# Patient Record
Sex: Female | Born: 1935
Health system: Southern US, Community
[De-identification: ages and names within clinical notes are randomized; demographics above are authoritative.]

## PROBLEM LIST (undated history)

## (undated) DIAGNOSIS — C7951 Secondary malignant neoplasm of bone: Secondary | ICD-10-CM

## (undated) DIAGNOSIS — Z8719 Personal history of other diseases of the digestive system: Secondary | ICD-10-CM

## (undated) DIAGNOSIS — I10 Essential (primary) hypertension: Secondary | ICD-10-CM

## (undated) DIAGNOSIS — C7949 Secondary malignant neoplasm of other parts of nervous system: Secondary | ICD-10-CM

## (undated) DIAGNOSIS — Z9221 Personal history of antineoplastic chemotherapy: Secondary | ICD-10-CM

## (undated) DIAGNOSIS — E119 Type 2 diabetes mellitus without complications: Secondary | ICD-10-CM

## (undated) DIAGNOSIS — C50919 Malignant neoplasm of unspecified site of unspecified female breast: Secondary | ICD-10-CM

## (undated) DIAGNOSIS — K219 Gastro-esophageal reflux disease without esophagitis: Secondary | ICD-10-CM

## (undated) DIAGNOSIS — Z923 Personal history of irradiation: Secondary | ICD-10-CM

## (undated) HISTORY — DX: Secondary malignant neoplasm of bone: C79.51

## (undated) HISTORY — DX: Secondary malignant neoplasm of other parts of nervous system: C79.49

## (undated) HISTORY — DX: Malignant neoplasm of unspecified site of unspecified female breast: C50.919

## (undated) HISTORY — DX: Personal history of irradiation: Z92.3

## (undated) HISTORY — DX: Personal history of antineoplastic chemotherapy: Z92.21

---

## 1993-10-01 HISTORY — PX: EYE SURGERY: SHX253

## 2004-10-01 DIAGNOSIS — C50919 Malignant neoplasm of unspecified site of unspecified female breast: Secondary | ICD-10-CM

## 2004-10-01 HISTORY — PX: MASTECTOMY: SHX3

## 2004-10-01 HISTORY — DX: Malignant neoplasm of unspecified site of unspecified female breast: C50.919

## 2010-12-08 ENCOUNTER — Other Ambulatory Visit (HOSPITAL_COMMUNITY): Payer: Self-pay | Admitting: Hematology and Oncology

## 2010-12-08 DIAGNOSIS — C50919 Malignant neoplasm of unspecified site of unspecified female breast: Secondary | ICD-10-CM

## 2011-03-08 ENCOUNTER — Other Ambulatory Visit (HOSPITAL_COMMUNITY): Payer: Self-pay

## 2011-03-26 ENCOUNTER — Encounter (HOSPITAL_COMMUNITY)
Admission: RE | Admit: 2011-03-26 | Discharge: 2011-03-26 | Disposition: A | Discharge status: Home / Self Care | Payer: Medicare Other | Source: Ambulatory Visit | Attending: Hematology and Oncology | Admitting: Hematology and Oncology

## 2011-03-26 DIAGNOSIS — C50919 Malignant neoplasm of unspecified site of unspecified female breast: Secondary | ICD-10-CM

## 2011-03-26 DIAGNOSIS — K8020 Calculus of gallbladder without cholecystitis without obstruction: Secondary | ICD-10-CM | POA: Insufficient documentation

## 2011-03-26 LAB — GLUCOSE, CAPILLARY: Glucose-Capillary: 144 mg/dL — ABNORMAL HIGH (ref 70–99)

## 2011-03-26 MED ORDER — FLUDEOXYGLUCOSE F - 18 (FDG) INJECTION
17.3000 | Freq: Once | INTRAVENOUS | Status: AC | PRN
  Administered 2011-03-26: 17.3 via INTRAVENOUS

## 2011-10-09 DIAGNOSIS — I10 Essential (primary) hypertension: Secondary | ICD-10-CM | POA: Diagnosis not present

## 2011-10-09 DIAGNOSIS — M810 Age-related osteoporosis without current pathological fracture: Secondary | ICD-10-CM | POA: Diagnosis not present

## 2011-10-09 DIAGNOSIS — K0500 Acute gingivitis, plaque induced: Secondary | ICD-10-CM | POA: Diagnosis not present

## 2011-12-18 DIAGNOSIS — Z79899 Other long term (current) drug therapy: Secondary | ICD-10-CM | POA: Diagnosis not present

## 2011-12-18 DIAGNOSIS — C50919 Malignant neoplasm of unspecified site of unspecified female breast: Secondary | ICD-10-CM | POA: Diagnosis not present

## 2011-12-18 DIAGNOSIS — E119 Type 2 diabetes mellitus without complications: Secondary | ICD-10-CM | POA: Diagnosis not present

## 2011-12-18 DIAGNOSIS — E785 Hyperlipidemia, unspecified: Secondary | ICD-10-CM | POA: Diagnosis not present

## 2011-12-18 DIAGNOSIS — M810 Age-related osteoporosis without current pathological fracture: Secondary | ICD-10-CM | POA: Diagnosis not present

## 2011-12-18 DIAGNOSIS — Z170 Estrogen receptor positive status [ER+]: Secondary | ICD-10-CM | POA: Diagnosis not present

## 2011-12-18 DIAGNOSIS — Z23 Encounter for immunization: Secondary | ICD-10-CM | POA: Diagnosis not present

## 2011-12-18 DIAGNOSIS — I10 Essential (primary) hypertension: Secondary | ICD-10-CM | POA: Diagnosis not present

## 2011-12-20 DIAGNOSIS — C50919 Malignant neoplasm of unspecified site of unspecified female breast: Secondary | ICD-10-CM | POA: Diagnosis not present

## 2011-12-20 DIAGNOSIS — E119 Type 2 diabetes mellitus without complications: Secondary | ICD-10-CM | POA: Diagnosis not present

## 2011-12-20 DIAGNOSIS — Z79899 Other long term (current) drug therapy: Secondary | ICD-10-CM | POA: Diagnosis not present

## 2011-12-20 DIAGNOSIS — E785 Hyperlipidemia, unspecified: Secondary | ICD-10-CM | POA: Diagnosis not present

## 2011-12-20 DIAGNOSIS — I10 Essential (primary) hypertension: Secondary | ICD-10-CM | POA: Diagnosis not present

## 2011-12-20 DIAGNOSIS — M810 Age-related osteoporosis without current pathological fracture: Secondary | ICD-10-CM | POA: Diagnosis not present

## 2011-12-20 DIAGNOSIS — Z23 Encounter for immunization: Secondary | ICD-10-CM | POA: Diagnosis not present

## 2011-12-20 DIAGNOSIS — Z170 Estrogen receptor positive status [ER+]: Secondary | ICD-10-CM | POA: Diagnosis not present

## 2012-01-15 DIAGNOSIS — E782 Mixed hyperlipidemia: Secondary | ICD-10-CM | POA: Diagnosis not present

## 2012-01-15 DIAGNOSIS — I10 Essential (primary) hypertension: Secondary | ICD-10-CM | POA: Diagnosis not present

## 2012-04-21 DIAGNOSIS — Z780 Asymptomatic menopausal state: Secondary | ICD-10-CM | POA: Diagnosis not present

## 2012-04-21 DIAGNOSIS — M899 Disorder of bone, unspecified: Secondary | ICD-10-CM | POA: Diagnosis not present

## 2012-04-21 DIAGNOSIS — Z9010 Acquired absence of unspecified breast and nipple: Secondary | ICD-10-CM | POA: Diagnosis not present

## 2012-04-21 DIAGNOSIS — Z79899 Other long term (current) drug therapy: Secondary | ICD-10-CM | POA: Diagnosis not present

## 2012-04-21 DIAGNOSIS — Z7983 Long term (current) use of bisphosphonates: Secondary | ICD-10-CM | POA: Diagnosis not present

## 2012-04-21 DIAGNOSIS — E119 Type 2 diabetes mellitus without complications: Secondary | ICD-10-CM | POA: Diagnosis not present

## 2012-04-21 DIAGNOSIS — E785 Hyperlipidemia, unspecified: Secondary | ICD-10-CM | POA: Diagnosis not present

## 2012-04-21 DIAGNOSIS — Z853 Personal history of malignant neoplasm of breast: Secondary | ICD-10-CM | POA: Diagnosis not present

## 2012-04-21 DIAGNOSIS — C50919 Malignant neoplasm of unspecified site of unspecified female breast: Secondary | ICD-10-CM | POA: Diagnosis not present

## 2012-04-21 DIAGNOSIS — M129 Arthropathy, unspecified: Secondary | ICD-10-CM | POA: Diagnosis not present

## 2012-04-21 DIAGNOSIS — I10 Essential (primary) hypertension: Secondary | ICD-10-CM | POA: Diagnosis not present

## 2012-04-21 DIAGNOSIS — M810 Age-related osteoporosis without current pathological fracture: Secondary | ICD-10-CM | POA: Diagnosis not present

## 2012-04-22 DIAGNOSIS — I10 Essential (primary) hypertension: Secondary | ICD-10-CM | POA: Diagnosis not present

## 2012-05-02 ENCOUNTER — Encounter: Payer: Medicare Other | Admitting: Hematology and Oncology

## 2012-05-05 ENCOUNTER — Encounter: Payer: Medicare Other | Admitting: Hematology and Oncology

## 2012-05-05 DIAGNOSIS — C773 Secondary and unspecified malignant neoplasm of axilla and upper limb lymph nodes: Secondary | ICD-10-CM

## 2012-05-05 DIAGNOSIS — C50919 Malignant neoplasm of unspecified site of unspecified female breast: Secondary | ICD-10-CM

## 2012-05-05 DIAGNOSIS — Z170 Estrogen receptor positive status [ER+]: Secondary | ICD-10-CM

## 2012-05-20 ENCOUNTER — Encounter: Payer: Medicare Other | Admitting: Hematology and Oncology

## 2012-05-20 DIAGNOSIS — I10 Essential (primary) hypertension: Secondary | ICD-10-CM | POA: Diagnosis not present

## 2012-05-20 DIAGNOSIS — E119 Type 2 diabetes mellitus without complications: Secondary | ICD-10-CM | POA: Diagnosis not present

## 2012-05-20 DIAGNOSIS — M899 Disorder of bone, unspecified: Secondary | ICD-10-CM | POA: Diagnosis not present

## 2012-05-20 DIAGNOSIS — E785 Hyperlipidemia, unspecified: Secondary | ICD-10-CM | POA: Diagnosis not present

## 2012-05-20 DIAGNOSIS — M949 Disorder of cartilage, unspecified: Secondary | ICD-10-CM | POA: Diagnosis not present

## 2012-05-20 DIAGNOSIS — C50919 Malignant neoplasm of unspecified site of unspecified female breast: Secondary | ICD-10-CM

## 2012-05-27 DIAGNOSIS — I10 Essential (primary) hypertension: Secondary | ICD-10-CM | POA: Diagnosis not present

## 2012-05-27 DIAGNOSIS — C50919 Malignant neoplasm of unspecified site of unspecified female breast: Secondary | ICD-10-CM | POA: Diagnosis not present

## 2012-05-27 DIAGNOSIS — M949 Disorder of cartilage, unspecified: Secondary | ICD-10-CM | POA: Diagnosis not present

## 2012-05-27 DIAGNOSIS — C779 Secondary and unspecified malignant neoplasm of lymph node, unspecified: Secondary | ICD-10-CM | POA: Diagnosis not present

## 2012-05-27 DIAGNOSIS — E785 Hyperlipidemia, unspecified: Secondary | ICD-10-CM | POA: Diagnosis not present

## 2012-05-27 DIAGNOSIS — E119 Type 2 diabetes mellitus without complications: Secondary | ICD-10-CM | POA: Diagnosis not present

## 2012-05-30 ENCOUNTER — Encounter: Payer: Medicare Other | Admitting: Hematology and Oncology

## 2012-05-30 DIAGNOSIS — C50919 Malignant neoplasm of unspecified site of unspecified female breast: Secondary | ICD-10-CM | POA: Diagnosis not present

## 2012-06-04 ENCOUNTER — Other Ambulatory Visit: Payer: Self-pay | Admitting: Hematology and Oncology

## 2012-06-04 DIAGNOSIS — C50919 Malignant neoplasm of unspecified site of unspecified female breast: Secondary | ICD-10-CM

## 2012-06-05 DIAGNOSIS — C50919 Malignant neoplasm of unspecified site of unspecified female breast: Secondary | ICD-10-CM | POA: Diagnosis not present

## 2012-06-05 DIAGNOSIS — Z5111 Encounter for antineoplastic chemotherapy: Secondary | ICD-10-CM | POA: Diagnosis not present

## 2012-06-05 DIAGNOSIS — M810 Age-related osteoporosis without current pathological fracture: Secondary | ICD-10-CM

## 2012-06-16 ENCOUNTER — Encounter (HOSPITAL_COMMUNITY)
Admission: RE | Admit: 2012-06-16 | Discharge: 2012-06-16 | Disposition: A | Discharge status: Home / Self Care | Payer: Medicare Other | Source: Ambulatory Visit | Attending: Hematology and Oncology | Admitting: Hematology and Oncology

## 2012-06-16 DIAGNOSIS — C50919 Malignant neoplasm of unspecified site of unspecified female breast: Secondary | ICD-10-CM

## 2012-06-16 DIAGNOSIS — K5730 Diverticulosis of large intestine without perforation or abscess without bleeding: Secondary | ICD-10-CM | POA: Insufficient documentation

## 2012-06-16 DIAGNOSIS — K8020 Calculus of gallbladder without cholecystitis without obstruction: Secondary | ICD-10-CM | POA: Insufficient documentation

## 2012-06-16 MED ORDER — FLUDEOXYGLUCOSE F - 18 (FDG) INJECTION
17.1000 | Freq: Once | INTRAVENOUS | Status: AC | PRN
  Administered 2012-06-16: 17.1 via INTRAVENOUS

## 2012-06-20 ENCOUNTER — Encounter: Payer: Medicare Other | Admitting: Hematology and Oncology

## 2012-06-20 DIAGNOSIS — Z5111 Encounter for antineoplastic chemotherapy: Secondary | ICD-10-CM | POA: Diagnosis not present

## 2012-06-20 DIAGNOSIS — C50919 Malignant neoplasm of unspecified site of unspecified female breast: Secondary | ICD-10-CM | POA: Diagnosis not present

## 2012-07-01 DIAGNOSIS — E119 Type 2 diabetes mellitus without complications: Secondary | ICD-10-CM | POA: Diagnosis not present

## 2012-07-01 DIAGNOSIS — Z853 Personal history of malignant neoplasm of breast: Secondary | ICD-10-CM | POA: Diagnosis not present

## 2012-07-01 DIAGNOSIS — Z9010 Acquired absence of unspecified breast and nipple: Secondary | ICD-10-CM | POA: Diagnosis not present

## 2012-07-01 DIAGNOSIS — Z510 Encounter for antineoplastic radiation therapy: Secondary | ICD-10-CM | POA: Diagnosis not present

## 2012-07-01 DIAGNOSIS — C50919 Malignant neoplasm of unspecified site of unspecified female breast: Secondary | ICD-10-CM | POA: Diagnosis not present

## 2012-07-01 DIAGNOSIS — Z79899 Other long term (current) drug therapy: Secondary | ICD-10-CM | POA: Diagnosis not present

## 2012-07-01 DIAGNOSIS — I10 Essential (primary) hypertension: Secondary | ICD-10-CM | POA: Diagnosis not present

## 2012-07-02 DIAGNOSIS — C50919 Malignant neoplasm of unspecified site of unspecified female breast: Secondary | ICD-10-CM | POA: Diagnosis not present

## 2012-07-03 DIAGNOSIS — C50919 Malignant neoplasm of unspecified site of unspecified female breast: Secondary | ICD-10-CM | POA: Diagnosis not present

## 2012-07-07 DIAGNOSIS — C50919 Malignant neoplasm of unspecified site of unspecified female breast: Secondary | ICD-10-CM | POA: Diagnosis not present

## 2012-07-08 DIAGNOSIS — C50919 Malignant neoplasm of unspecified site of unspecified female breast: Secondary | ICD-10-CM | POA: Diagnosis not present

## 2012-07-15 DIAGNOSIS — C50919 Malignant neoplasm of unspecified site of unspecified female breast: Secondary | ICD-10-CM | POA: Diagnosis not present

## 2012-07-22 DIAGNOSIS — Z5111 Encounter for antineoplastic chemotherapy: Secondary | ICD-10-CM

## 2012-07-22 DIAGNOSIS — Z23 Encounter for immunization: Secondary | ICD-10-CM | POA: Diagnosis not present

## 2012-07-22 DIAGNOSIS — C50919 Malignant neoplasm of unspecified site of unspecified female breast: Secondary | ICD-10-CM | POA: Diagnosis not present

## 2012-07-30 DIAGNOSIS — C50919 Malignant neoplasm of unspecified site of unspecified female breast: Secondary | ICD-10-CM | POA: Diagnosis not present

## 2012-08-01 DIAGNOSIS — C50919 Malignant neoplasm of unspecified site of unspecified female breast: Secondary | ICD-10-CM | POA: Diagnosis not present

## 2012-08-01 DIAGNOSIS — Z510 Encounter for antineoplastic radiation therapy: Secondary | ICD-10-CM | POA: Diagnosis not present

## 2012-08-04 DIAGNOSIS — Z510 Encounter for antineoplastic radiation therapy: Secondary | ICD-10-CM | POA: Diagnosis not present

## 2012-08-04 DIAGNOSIS — I10 Essential (primary) hypertension: Secondary | ICD-10-CM | POA: Diagnosis not present

## 2012-08-04 DIAGNOSIS — Z13220 Encounter for screening for lipoid disorders: Secondary | ICD-10-CM | POA: Diagnosis not present

## 2012-08-04 DIAGNOSIS — C50919 Malignant neoplasm of unspecified site of unspecified female breast: Secondary | ICD-10-CM | POA: Diagnosis not present

## 2012-08-05 DIAGNOSIS — C50919 Malignant neoplasm of unspecified site of unspecified female breast: Secondary | ICD-10-CM | POA: Diagnosis not present

## 2012-08-05 DIAGNOSIS — Z510 Encounter for antineoplastic radiation therapy: Secondary | ICD-10-CM | POA: Diagnosis not present

## 2012-08-14 DIAGNOSIS — Z510 Encounter for antineoplastic radiation therapy: Secondary | ICD-10-CM | POA: Diagnosis not present

## 2012-08-14 DIAGNOSIS — C50919 Malignant neoplasm of unspecified site of unspecified female breast: Secondary | ICD-10-CM | POA: Diagnosis not present

## 2012-08-18 ENCOUNTER — Encounter: Payer: Medicare Other | Admitting: Internal Medicine

## 2012-08-18 DIAGNOSIS — Z5111 Encounter for antineoplastic chemotherapy: Secondary | ICD-10-CM | POA: Diagnosis not present

## 2012-08-18 DIAGNOSIS — C50919 Malignant neoplasm of unspecified site of unspecified female breast: Secondary | ICD-10-CM | POA: Diagnosis not present

## 2012-08-18 DIAGNOSIS — M810 Age-related osteoporosis without current pathological fracture: Secondary | ICD-10-CM

## 2012-09-17 DIAGNOSIS — C50919 Malignant neoplasm of unspecified site of unspecified female breast: Secondary | ICD-10-CM | POA: Diagnosis not present

## 2012-09-17 DIAGNOSIS — Z5111 Encounter for antineoplastic chemotherapy: Secondary | ICD-10-CM | POA: Diagnosis not present

## 2012-09-17 DIAGNOSIS — M810 Age-related osteoporosis without current pathological fracture: Secondary | ICD-10-CM | POA: Diagnosis not present

## 2012-09-17 DIAGNOSIS — E119 Type 2 diabetes mellitus without complications: Secondary | ICD-10-CM | POA: Diagnosis not present

## 2012-10-27 DIAGNOSIS — C50919 Malignant neoplasm of unspecified site of unspecified female breast: Secondary | ICD-10-CM | POA: Diagnosis not present

## 2012-10-29 DIAGNOSIS — C50919 Malignant neoplasm of unspecified site of unspecified female breast: Secondary | ICD-10-CM | POA: Diagnosis not present

## 2012-10-29 DIAGNOSIS — M810 Age-related osteoporosis without current pathological fracture: Secondary | ICD-10-CM

## 2012-10-29 DIAGNOSIS — M545 Low back pain, unspecified: Secondary | ICD-10-CM | POA: Diagnosis not present

## 2012-10-29 DIAGNOSIS — Z5111 Encounter for antineoplastic chemotherapy: Secondary | ICD-10-CM | POA: Diagnosis not present

## 2012-11-03 DIAGNOSIS — M51379 Other intervertebral disc degeneration, lumbosacral region without mention of lumbar back pain or lower extremity pain: Secondary | ICD-10-CM | POA: Diagnosis not present

## 2012-11-03 DIAGNOSIS — C7951 Secondary malignant neoplasm of bone: Secondary | ICD-10-CM | POA: Diagnosis not present

## 2012-11-03 DIAGNOSIS — Z510 Encounter for antineoplastic radiation therapy: Secondary | ICD-10-CM | POA: Diagnosis not present

## 2012-11-03 DIAGNOSIS — M5137 Other intervertebral disc degeneration, lumbosacral region: Secondary | ICD-10-CM | POA: Diagnosis not present

## 2012-11-03 DIAGNOSIS — Z79899 Other long term (current) drug therapy: Secondary | ICD-10-CM | POA: Diagnosis not present

## 2012-11-03 DIAGNOSIS — Z853 Personal history of malignant neoplasm of breast: Secondary | ICD-10-CM | POA: Diagnosis not present

## 2012-11-03 DIAGNOSIS — C50919 Malignant neoplasm of unspecified site of unspecified female breast: Secondary | ICD-10-CM | POA: Diagnosis not present

## 2012-11-07 ENCOUNTER — Other Ambulatory Visit: Payer: Self-pay | Admitting: Hematology and Oncology

## 2012-11-07 DIAGNOSIS — C7952 Secondary malignant neoplasm of bone marrow: Secondary | ICD-10-CM

## 2012-11-07 DIAGNOSIS — C7951 Secondary malignant neoplasm of bone: Secondary | ICD-10-CM | POA: Diagnosis not present

## 2012-11-07 DIAGNOSIS — I10 Essential (primary) hypertension: Secondary | ICD-10-CM

## 2012-11-07 DIAGNOSIS — E119 Type 2 diabetes mellitus without complications: Secondary | ICD-10-CM | POA: Diagnosis not present

## 2012-11-07 DIAGNOSIS — C50919 Malignant neoplasm of unspecified site of unspecified female breast: Secondary | ICD-10-CM

## 2012-11-11 DIAGNOSIS — C7951 Secondary malignant neoplasm of bone: Secondary | ICD-10-CM | POA: Diagnosis not present

## 2012-11-12 DIAGNOSIS — C7952 Secondary malignant neoplasm of bone marrow: Secondary | ICD-10-CM | POA: Diagnosis not present

## 2012-11-17 DIAGNOSIS — C7951 Secondary malignant neoplasm of bone: Secondary | ICD-10-CM | POA: Diagnosis not present

## 2012-11-17 DIAGNOSIS — C7952 Secondary malignant neoplasm of bone marrow: Secondary | ICD-10-CM | POA: Diagnosis not present

## 2012-11-19 ENCOUNTER — Encounter (HOSPITAL_COMMUNITY)
Admission: RE | Admit: 2012-11-19 | Discharge: 2012-11-19 | Disposition: A | Discharge status: Home / Self Care | Payer: Medicare Other | Source: Ambulatory Visit | Attending: Hematology and Oncology | Admitting: Hematology and Oncology

## 2012-11-19 DIAGNOSIS — K8020 Calculus of gallbladder without cholecystitis without obstruction: Secondary | ICD-10-CM | POA: Diagnosis not present

## 2012-11-19 DIAGNOSIS — C50919 Malignant neoplasm of unspecified site of unspecified female breast: Secondary | ICD-10-CM

## 2012-11-19 DIAGNOSIS — C770 Secondary and unspecified malignant neoplasm of lymph nodes of head, face and neck: Secondary | ICD-10-CM | POA: Diagnosis not present

## 2012-11-19 LAB — GLUCOSE, CAPILLARY: Glucose-Capillary: 121 mg/dL — ABNORMAL HIGH (ref 70–99)

## 2012-11-19 MED ORDER — FLUDEOXYGLUCOSE F - 18 (FDG) INJECTION
16.6000 | Freq: Once | INTRAVENOUS | Status: AC | PRN
  Administered 2012-11-19: 16.6 via INTRAVENOUS

## 2012-11-21 DIAGNOSIS — C7952 Secondary malignant neoplasm of bone marrow: Secondary | ICD-10-CM | POA: Diagnosis not present

## 2012-11-21 DIAGNOSIS — I10 Essential (primary) hypertension: Secondary | ICD-10-CM | POA: Diagnosis not present

## 2012-11-21 DIAGNOSIS — C7951 Secondary malignant neoplasm of bone: Secondary | ICD-10-CM

## 2012-11-21 DIAGNOSIS — C50919 Malignant neoplasm of unspecified site of unspecified female breast: Secondary | ICD-10-CM | POA: Diagnosis not present

## 2012-11-24 DIAGNOSIS — C7952 Secondary malignant neoplasm of bone marrow: Secondary | ICD-10-CM | POA: Diagnosis not present

## 2012-11-27 DIAGNOSIS — C7952 Secondary malignant neoplasm of bone marrow: Secondary | ICD-10-CM

## 2012-11-27 DIAGNOSIS — Z5111 Encounter for antineoplastic chemotherapy: Secondary | ICD-10-CM | POA: Diagnosis not present

## 2012-11-27 DIAGNOSIS — C50919 Malignant neoplasm of unspecified site of unspecified female breast: Secondary | ICD-10-CM | POA: Diagnosis not present

## 2012-11-27 DIAGNOSIS — C7951 Secondary malignant neoplasm of bone: Secondary | ICD-10-CM | POA: Diagnosis not present

## 2012-12-19 DIAGNOSIS — C7952 Secondary malignant neoplasm of bone marrow: Secondary | ICD-10-CM | POA: Diagnosis not present

## 2012-12-19 DIAGNOSIS — C7951 Secondary malignant neoplasm of bone: Secondary | ICD-10-CM

## 2012-12-19 DIAGNOSIS — C50919 Malignant neoplasm of unspecified site of unspecified female breast: Secondary | ICD-10-CM

## 2012-12-26 DIAGNOSIS — Z5111 Encounter for antineoplastic chemotherapy: Secondary | ICD-10-CM | POA: Diagnosis not present

## 2012-12-26 DIAGNOSIS — C50919 Malignant neoplasm of unspecified site of unspecified female breast: Secondary | ICD-10-CM | POA: Diagnosis not present

## 2012-12-26 DIAGNOSIS — C7951 Secondary malignant neoplasm of bone: Secondary | ICD-10-CM

## 2012-12-26 DIAGNOSIS — C7952 Secondary malignant neoplasm of bone marrow: Secondary | ICD-10-CM

## 2012-12-29 DIAGNOSIS — R5383 Other fatigue: Secondary | ICD-10-CM | POA: Diagnosis not present

## 2012-12-29 DIAGNOSIS — E871 Hypo-osmolality and hyponatremia: Secondary | ICD-10-CM | POA: Diagnosis not present

## 2012-12-29 DIAGNOSIS — C50919 Malignant neoplasm of unspecified site of unspecified female breast: Secondary | ICD-10-CM | POA: Diagnosis not present

## 2012-12-29 DIAGNOSIS — M6281 Muscle weakness (generalized): Secondary | ICD-10-CM | POA: Diagnosis not present

## 2012-12-29 DIAGNOSIS — K922 Gastrointestinal hemorrhage, unspecified: Secondary | ICD-10-CM | POA: Diagnosis not present

## 2012-12-29 DIAGNOSIS — G893 Neoplasm related pain (acute) (chronic): Secondary | ICD-10-CM | POA: Diagnosis present

## 2012-12-29 DIAGNOSIS — C7951 Secondary malignant neoplasm of bone: Secondary | ICD-10-CM | POA: Diagnosis not present

## 2012-12-29 DIAGNOSIS — Z780 Asymptomatic menopausal state: Secondary | ICD-10-CM | POA: Diagnosis not present

## 2012-12-29 DIAGNOSIS — Z853 Personal history of malignant neoplasm of breast: Secondary | ICD-10-CM | POA: Diagnosis not present

## 2012-12-29 DIAGNOSIS — R279 Unspecified lack of coordination: Secondary | ICD-10-CM | POA: Diagnosis not present

## 2012-12-29 DIAGNOSIS — M810 Age-related osteoporosis without current pathological fracture: Secondary | ICD-10-CM | POA: Diagnosis present

## 2012-12-29 DIAGNOSIS — E785 Hyperlipidemia, unspecified: Secondary | ICD-10-CM | POA: Diagnosis present

## 2012-12-29 DIAGNOSIS — I10 Essential (primary) hypertension: Secondary | ICD-10-CM | POA: Diagnosis present

## 2012-12-29 DIAGNOSIS — E119 Type 2 diabetes mellitus without complications: Secondary | ICD-10-CM | POA: Diagnosis present

## 2012-12-29 DIAGNOSIS — N390 Urinary tract infection, site not specified: Secondary | ICD-10-CM | POA: Diagnosis not present

## 2012-12-29 DIAGNOSIS — R269 Unspecified abnormalities of gait and mobility: Secondary | ICD-10-CM | POA: Diagnosis not present

## 2012-12-29 DIAGNOSIS — K219 Gastro-esophageal reflux disease without esophagitis: Secondary | ICD-10-CM | POA: Diagnosis not present

## 2012-12-29 DIAGNOSIS — R5381 Other malaise: Secondary | ICD-10-CM | POA: Diagnosis not present

## 2012-12-29 DIAGNOSIS — Z5189 Encounter for other specified aftercare: Secondary | ICD-10-CM | POA: Diagnosis not present

## 2012-12-29 DIAGNOSIS — C7952 Secondary malignant neoplasm of bone marrow: Secondary | ICD-10-CM | POA: Diagnosis not present

## 2012-12-29 DIAGNOSIS — D649 Anemia, unspecified: Secondary | ICD-10-CM | POA: Diagnosis not present

## 2012-12-29 DIAGNOSIS — Z79899 Other long term (current) drug therapy: Secondary | ICD-10-CM | POA: Diagnosis not present

## 2012-12-29 DIAGNOSIS — E236 Other disorders of pituitary gland: Secondary | ICD-10-CM | POA: Diagnosis not present

## 2012-12-30 DIAGNOSIS — C50919 Malignant neoplasm of unspecified site of unspecified female breast: Secondary | ICD-10-CM

## 2013-01-01 DIAGNOSIS — N390 Urinary tract infection, site not specified: Secondary | ICD-10-CM | POA: Diagnosis not present

## 2013-01-01 DIAGNOSIS — G893 Neoplasm related pain (acute) (chronic): Secondary | ICD-10-CM | POA: Diagnosis not present

## 2013-01-01 DIAGNOSIS — E236 Other disorders of pituitary gland: Secondary | ICD-10-CM | POA: Diagnosis not present

## 2013-01-01 DIAGNOSIS — M6281 Muscle weakness (generalized): Secondary | ICD-10-CM | POA: Diagnosis not present

## 2013-01-01 DIAGNOSIS — M546 Pain in thoracic spine: Secondary | ICD-10-CM | POA: Diagnosis not present

## 2013-01-01 DIAGNOSIS — C7981 Secondary malignant neoplasm of breast: Secondary | ICD-10-CM | POA: Diagnosis not present

## 2013-01-01 DIAGNOSIS — C50919 Malignant neoplasm of unspecified site of unspecified female breast: Secondary | ICD-10-CM | POA: Diagnosis not present

## 2013-01-01 DIAGNOSIS — E119 Type 2 diabetes mellitus without complications: Secondary | ICD-10-CM | POA: Diagnosis not present

## 2013-01-01 DIAGNOSIS — M8448XA Pathological fracture, other site, initial encounter for fracture: Secondary | ICD-10-CM | POA: Diagnosis not present

## 2013-01-01 DIAGNOSIS — Z981 Arthrodesis status: Secondary | ICD-10-CM | POA: Diagnosis not present

## 2013-01-01 DIAGNOSIS — R5381 Other malaise: Secondary | ICD-10-CM | POA: Diagnosis not present

## 2013-01-01 DIAGNOSIS — Z79899 Other long term (current) drug therapy: Secondary | ICD-10-CM | POA: Diagnosis not present

## 2013-01-01 DIAGNOSIS — Z5189 Encounter for other specified aftercare: Secondary | ICD-10-CM | POA: Diagnosis not present

## 2013-01-01 DIAGNOSIS — Z794 Long term (current) use of insulin: Secondary | ICD-10-CM | POA: Diagnosis not present

## 2013-01-01 DIAGNOSIS — C7949 Secondary malignant neoplasm of other parts of nervous system: Secondary | ICD-10-CM | POA: Diagnosis not present

## 2013-01-01 DIAGNOSIS — M5380 Other specified dorsopathies, site unspecified: Secondary | ICD-10-CM | POA: Diagnosis not present

## 2013-01-01 DIAGNOSIS — K219 Gastro-esophageal reflux disease without esophagitis: Secondary | ICD-10-CM | POA: Diagnosis not present

## 2013-01-01 DIAGNOSIS — R279 Unspecified lack of coordination: Secondary | ICD-10-CM | POA: Diagnosis not present

## 2013-01-01 DIAGNOSIS — E785 Hyperlipidemia, unspecified: Secondary | ICD-10-CM | POA: Diagnosis not present

## 2013-01-01 DIAGNOSIS — M549 Dorsalgia, unspecified: Secondary | ICD-10-CM | POA: Diagnosis not present

## 2013-01-01 DIAGNOSIS — E871 Hypo-osmolality and hyponatremia: Secondary | ICD-10-CM | POA: Diagnosis not present

## 2013-01-01 DIAGNOSIS — K921 Melena: Secondary | ICD-10-CM | POA: Diagnosis not present

## 2013-01-01 DIAGNOSIS — G992 Myelopathy in diseases classified elsewhere: Secondary | ICD-10-CM | POA: Diagnosis not present

## 2013-01-01 DIAGNOSIS — Z780 Asymptomatic menopausal state: Secondary | ICD-10-CM | POA: Diagnosis not present

## 2013-01-01 DIAGNOSIS — I10 Essential (primary) hypertension: Secondary | ICD-10-CM | POA: Diagnosis not present

## 2013-01-01 DIAGNOSIS — Z452 Encounter for adjustment and management of vascular access device: Secondary | ICD-10-CM | POA: Diagnosis not present

## 2013-01-01 DIAGNOSIS — Z5111 Encounter for antineoplastic chemotherapy: Secondary | ICD-10-CM | POA: Diagnosis not present

## 2013-01-01 DIAGNOSIS — R293 Abnormal posture: Secondary | ICD-10-CM | POA: Diagnosis not present

## 2013-01-01 DIAGNOSIS — D367 Benign neoplasm of other specified sites: Secondary | ICD-10-CM | POA: Diagnosis not present

## 2013-01-01 DIAGNOSIS — C7951 Secondary malignant neoplasm of bone: Secondary | ICD-10-CM | POA: Diagnosis not present

## 2013-01-01 DIAGNOSIS — K922 Gastrointestinal hemorrhage, unspecified: Secondary | ICD-10-CM | POA: Diagnosis not present

## 2013-01-01 DIAGNOSIS — D649 Anemia, unspecified: Secondary | ICD-10-CM | POA: Diagnosis not present

## 2013-01-01 DIAGNOSIS — J9819 Other pulmonary collapse: Secondary | ICD-10-CM | POA: Diagnosis not present

## 2013-01-01 DIAGNOSIS — R269 Unspecified abnormalities of gait and mobility: Secondary | ICD-10-CM | POA: Diagnosis not present

## 2013-01-01 DIAGNOSIS — C801 Malignant (primary) neoplasm, unspecified: Secondary | ICD-10-CM | POA: Diagnosis not present

## 2013-01-07 DIAGNOSIS — C7981 Secondary malignant neoplasm of breast: Secondary | ICD-10-CM | POA: Diagnosis not present

## 2013-01-07 DIAGNOSIS — C801 Malignant (primary) neoplasm, unspecified: Secondary | ICD-10-CM | POA: Diagnosis not present

## 2013-01-08 DIAGNOSIS — C50919 Malignant neoplasm of unspecified site of unspecified female breast: Secondary | ICD-10-CM

## 2013-01-08 DIAGNOSIS — E119 Type 2 diabetes mellitus without complications: Secondary | ICD-10-CM

## 2013-01-08 DIAGNOSIS — K921 Melena: Secondary | ICD-10-CM | POA: Diagnosis not present

## 2013-01-14 DIAGNOSIS — C50919 Malignant neoplasm of unspecified site of unspecified female breast: Secondary | ICD-10-CM | POA: Diagnosis not present

## 2013-01-15 DIAGNOSIS — E119 Type 2 diabetes mellitus without complications: Secondary | ICD-10-CM | POA: Diagnosis not present

## 2013-01-15 DIAGNOSIS — I10 Essential (primary) hypertension: Secondary | ICD-10-CM | POA: Diagnosis not present

## 2013-01-15 DIAGNOSIS — J9819 Other pulmonary collapse: Secondary | ICD-10-CM | POA: Diagnosis not present

## 2013-01-15 DIAGNOSIS — C7951 Secondary malignant neoplasm of bone: Secondary | ICD-10-CM | POA: Diagnosis not present

## 2013-01-15 DIAGNOSIS — C7981 Secondary malignant neoplasm of breast: Secondary | ICD-10-CM | POA: Diagnosis not present

## 2013-01-15 DIAGNOSIS — C50919 Malignant neoplasm of unspecified site of unspecified female breast: Secondary | ICD-10-CM | POA: Diagnosis not present

## 2013-01-15 DIAGNOSIS — Z79899 Other long term (current) drug therapy: Secondary | ICD-10-CM | POA: Diagnosis not present

## 2013-01-15 DIAGNOSIS — Z452 Encounter for adjustment and management of vascular access device: Secondary | ICD-10-CM | POA: Diagnosis not present

## 2013-01-19 DIAGNOSIS — C7951 Secondary malignant neoplasm of bone: Secondary | ICD-10-CM

## 2013-01-19 DIAGNOSIS — Z5111 Encounter for antineoplastic chemotherapy: Secondary | ICD-10-CM

## 2013-01-19 DIAGNOSIS — C7952 Secondary malignant neoplasm of bone marrow: Secondary | ICD-10-CM

## 2013-01-19 DIAGNOSIS — C50919 Malignant neoplasm of unspecified site of unspecified female breast: Secondary | ICD-10-CM

## 2013-01-26 ENCOUNTER — Encounter: Payer: Medicare Other | Admitting: Internal Medicine

## 2013-01-26 DIAGNOSIS — C50919 Malignant neoplasm of unspecified site of unspecified female breast: Secondary | ICD-10-CM | POA: Diagnosis not present

## 2013-01-26 DIAGNOSIS — C7951 Secondary malignant neoplasm of bone: Secondary | ICD-10-CM

## 2013-01-26 DIAGNOSIS — C7952 Secondary malignant neoplasm of bone marrow: Secondary | ICD-10-CM

## 2013-01-26 DIAGNOSIS — Z5111 Encounter for antineoplastic chemotherapy: Secondary | ICD-10-CM

## 2013-02-02 DIAGNOSIS — C7952 Secondary malignant neoplasm of bone marrow: Secondary | ICD-10-CM

## 2013-02-02 DIAGNOSIS — Z5111 Encounter for antineoplastic chemotherapy: Secondary | ICD-10-CM

## 2013-02-02 DIAGNOSIS — C7951 Secondary malignant neoplasm of bone: Secondary | ICD-10-CM

## 2013-02-02 DIAGNOSIS — C50919 Malignant neoplasm of unspecified site of unspecified female breast: Secondary | ICD-10-CM

## 2013-02-03 DIAGNOSIS — M549 Dorsalgia, unspecified: Secondary | ICD-10-CM | POA: Diagnosis not present

## 2013-02-16 ENCOUNTER — Encounter: Payer: Medicare Other | Admitting: Internal Medicine

## 2013-02-16 DIAGNOSIS — G893 Neoplasm related pain (acute) (chronic): Secondary | ICD-10-CM

## 2013-02-16 DIAGNOSIS — C50919 Malignant neoplasm of unspecified site of unspecified female breast: Secondary | ICD-10-CM

## 2013-02-16 DIAGNOSIS — C7952 Secondary malignant neoplasm of bone marrow: Secondary | ICD-10-CM | POA: Diagnosis not present

## 2013-02-16 DIAGNOSIS — Z5111 Encounter for antineoplastic chemotherapy: Secondary | ICD-10-CM | POA: Diagnosis not present

## 2013-02-16 DIAGNOSIS — C7951 Secondary malignant neoplasm of bone: Secondary | ICD-10-CM

## 2013-02-24 DIAGNOSIS — C7952 Secondary malignant neoplasm of bone marrow: Secondary | ICD-10-CM | POA: Diagnosis not present

## 2013-02-24 DIAGNOSIS — Z5111 Encounter for antineoplastic chemotherapy: Secondary | ICD-10-CM

## 2013-02-24 DIAGNOSIS — C50919 Malignant neoplasm of unspecified site of unspecified female breast: Secondary | ICD-10-CM

## 2013-02-24 DIAGNOSIS — C7951 Secondary malignant neoplasm of bone: Secondary | ICD-10-CM

## 2013-03-02 DIAGNOSIS — C801 Malignant (primary) neoplasm, unspecified: Secondary | ICD-10-CM | POA: Diagnosis not present

## 2013-03-02 DIAGNOSIS — C50919 Malignant neoplasm of unspecified site of unspecified female breast: Secondary | ICD-10-CM | POA: Diagnosis not present

## 2013-03-02 DIAGNOSIS — M8448XA Pathological fracture, other site, initial encounter for fracture: Secondary | ICD-10-CM | POA: Diagnosis not present

## 2013-03-03 DIAGNOSIS — C7952 Secondary malignant neoplasm of bone marrow: Secondary | ICD-10-CM

## 2013-03-03 DIAGNOSIS — C50919 Malignant neoplasm of unspecified site of unspecified female breast: Secondary | ICD-10-CM | POA: Diagnosis not present

## 2013-03-03 DIAGNOSIS — C7951 Secondary malignant neoplasm of bone: Secondary | ICD-10-CM | POA: Diagnosis not present

## 2013-03-04 DIAGNOSIS — C7952 Secondary malignant neoplasm of bone marrow: Secondary | ICD-10-CM | POA: Diagnosis not present

## 2013-03-04 DIAGNOSIS — M546 Pain in thoracic spine: Secondary | ICD-10-CM | POA: Diagnosis not present

## 2013-03-06 ENCOUNTER — Encounter (HOSPITAL_COMMUNITY): Admission: AD | Disposition: A | Discharge status: Skilled Nursing Facility | Payer: Self-pay | Attending: Neurosurgery

## 2013-03-06 ENCOUNTER — Inpatient Hospital Stay (HOSPITAL_COMMUNITY): Payer: Medicare Other | Admitting: Anesthesiology

## 2013-03-06 ENCOUNTER — Encounter (HOSPITAL_COMMUNITY): Payer: Self-pay | Admitting: Anesthesiology

## 2013-03-06 ENCOUNTER — Other Ambulatory Visit: Payer: Self-pay | Admitting: Neurosurgery

## 2013-03-06 ENCOUNTER — Inpatient Hospital Stay (HOSPITAL_COMMUNITY): Payer: Medicare Other

## 2013-03-06 ENCOUNTER — Encounter (HOSPITAL_COMMUNITY): Payer: Self-pay | Admitting: *Deleted

## 2013-03-06 ENCOUNTER — Inpatient Hospital Stay (HOSPITAL_COMMUNITY)
Admission: AD | Admit: 2013-03-06 | Discharge: 2013-03-11 | DRG: 029 | Disposition: A | Discharge status: Skilled Nursing Facility | Payer: Medicare Other | Source: Other Acute Inpatient Hospital | Attending: Neurosurgery | Admitting: Neurosurgery

## 2013-03-06 DIAGNOSIS — C7949 Secondary malignant neoplasm of other parts of nervous system: Principal | ICD-10-CM | POA: Diagnosis present

## 2013-03-06 DIAGNOSIS — Z981 Arthrodesis status: Secondary | ICD-10-CM | POA: Diagnosis not present

## 2013-03-06 DIAGNOSIS — Z5189 Encounter for other specified aftercare: Secondary | ICD-10-CM | POA: Diagnosis not present

## 2013-03-06 DIAGNOSIS — I10 Essential (primary) hypertension: Secondary | ICD-10-CM | POA: Diagnosis not present

## 2013-03-06 DIAGNOSIS — R5381 Other malaise: Secondary | ICD-10-CM | POA: Diagnosis not present

## 2013-03-06 DIAGNOSIS — C7951 Secondary malignant neoplasm of bone: Secondary | ICD-10-CM | POA: Diagnosis present

## 2013-03-06 DIAGNOSIS — R279 Unspecified lack of coordination: Secondary | ICD-10-CM | POA: Diagnosis not present

## 2013-03-06 DIAGNOSIS — K219 Gastro-esophageal reflux disease without esophagitis: Secondary | ICD-10-CM | POA: Diagnosis not present

## 2013-03-06 DIAGNOSIS — G992 Myelopathy in diseases classified elsewhere: Secondary | ICD-10-CM | POA: Diagnosis present

## 2013-03-06 DIAGNOSIS — E119 Type 2 diabetes mellitus without complications: Secondary | ICD-10-CM | POA: Diagnosis not present

## 2013-03-06 DIAGNOSIS — E871 Hypo-osmolality and hyponatremia: Secondary | ICD-10-CM | POA: Diagnosis not present

## 2013-03-06 DIAGNOSIS — Z452 Encounter for adjustment and management of vascular access device: Secondary | ICD-10-CM | POA: Diagnosis not present

## 2013-03-06 DIAGNOSIS — D367 Benign neoplasm of other specified sites: Secondary | ICD-10-CM | POA: Diagnosis not present

## 2013-03-06 DIAGNOSIS — M6281 Muscle weakness (generalized): Secondary | ICD-10-CM | POA: Diagnosis not present

## 2013-03-06 DIAGNOSIS — C50919 Malignant neoplasm of unspecified site of unspecified female breast: Secondary | ICD-10-CM | POA: Diagnosis present

## 2013-03-06 DIAGNOSIS — M5380 Other specified dorsopathies, site unspecified: Secondary | ICD-10-CM | POA: Diagnosis not present

## 2013-03-06 DIAGNOSIS — C7931 Secondary malignant neoplasm of brain: Secondary | ICD-10-CM | POA: Diagnosis not present

## 2013-03-06 DIAGNOSIS — K922 Gastrointestinal hemorrhage, unspecified: Secondary | ICD-10-CM | POA: Diagnosis not present

## 2013-03-06 DIAGNOSIS — R293 Abnormal posture: Secondary | ICD-10-CM | POA: Diagnosis not present

## 2013-03-06 DIAGNOSIS — G893 Neoplasm related pain (acute) (chronic): Secondary | ICD-10-CM | POA: Diagnosis not present

## 2013-03-06 DIAGNOSIS — Z794 Long term (current) use of insulin: Secondary | ICD-10-CM | POA: Diagnosis not present

## 2013-03-06 DIAGNOSIS — J9819 Other pulmonary collapse: Secondary | ICD-10-CM | POA: Diagnosis not present

## 2013-03-06 DIAGNOSIS — E785 Hyperlipidemia, unspecified: Secondary | ICD-10-CM | POA: Diagnosis not present

## 2013-03-06 DIAGNOSIS — R269 Unspecified abnormalities of gait and mobility: Secondary | ICD-10-CM | POA: Diagnosis not present

## 2013-03-06 DIAGNOSIS — M8448XA Pathological fracture, other site, initial encounter for fracture: Secondary | ICD-10-CM | POA: Diagnosis not present

## 2013-03-06 DIAGNOSIS — Z780 Asymptomatic menopausal state: Secondary | ICD-10-CM | POA: Diagnosis not present

## 2013-03-06 HISTORY — DX: Essential (primary) hypertension: I10

## 2013-03-06 HISTORY — DX: Gastro-esophageal reflux disease without esophagitis: K21.9

## 2013-03-06 HISTORY — DX: Type 2 diabetes mellitus without complications: E11.9

## 2013-03-06 HISTORY — PX: POSTERIOR LUMBAR FUSION 4 LEVEL: SHX6037

## 2013-03-06 HISTORY — DX: Personal history of other diseases of the digestive system: Z87.19

## 2013-03-06 LAB — PREPARE RBC (CROSSMATCH)

## 2013-03-06 LAB — ABO/RH: ABO/RH(D): O POS

## 2013-03-06 SURGERY — POSTERIOR LUMBAR FUSION 4 LEVEL
Anesthesia: General | Site: Back | Wound class: Clean

## 2013-03-06 MED ORDER — THROMBIN 20000 UNITS EX KIT
PACK | CUTANEOUS | Status: DC | PRN
  Administered 2013-03-06: 21:00:00 via TOPICAL

## 2013-03-06 MED ORDER — ROCURONIUM BROMIDE 100 MG/10ML IV SOLN
INTRAVENOUS | Status: DC | PRN
  Administered 2013-03-06: 10 mg via INTRAVENOUS
  Administered 2013-03-06: 50 mg via INTRAVENOUS
  Administered 2013-03-06 (×2): 10 mg via INTRAVENOUS
  Administered 2013-03-06: 20 mg via INTRAVENOUS

## 2013-03-06 MED ORDER — CEFAZOLIN SODIUM-DEXTROSE 2-3 GM-% IV SOLR
2.0000 g | INTRAVENOUS | Status: AC
  Administered 2013-03-06: 2 g via INTRAVENOUS
  Filled 2013-03-06: qty 50

## 2013-03-06 MED ORDER — BUPIVACAINE LIPOSOME 1.3 % IJ SUSP
20.0000 mL | Freq: Once | INTRAMUSCULAR | Status: DC
  Filled 2013-03-06: qty 20

## 2013-03-06 MED ORDER — LIDOCAINE-EPINEPHRINE 0.5 %-1:200000 IJ SOLN
INTRAMUSCULAR | Status: DC | PRN
  Administered 2013-03-06: 20 mL

## 2013-03-06 MED ORDER — LACTATED RINGERS IV SOLN
INTRAVENOUS | Status: DC | PRN
  Administered 2013-03-06: 20:00:00 via INTRAVENOUS

## 2013-03-06 MED ORDER — WHITE PETROLATUM GEL
Status: AC
  Filled 2013-03-06: qty 5

## 2013-03-06 MED ORDER — 0.9 % SODIUM CHLORIDE (POUR BTL) OPTIME
TOPICAL | Status: DC | PRN
  Administered 2013-03-06: 1000 mL

## 2013-03-06 MED ORDER — DEXAMETHASONE SODIUM PHOSPHATE 4 MG/ML IJ SOLN
INTRAMUSCULAR | Status: DC | PRN
  Administered 2013-03-06: 10 mg via INTRAVENOUS

## 2013-03-06 MED ORDER — LACTATED RINGERS IV SOLN
INTRAVENOUS | Status: DC | PRN
  Administered 2013-03-06 (×2): via INTRAVENOUS

## 2013-03-06 MED ORDER — FENTANYL CITRATE 0.05 MG/ML IJ SOLN
INTRAMUSCULAR | Status: DC | PRN
  Administered 2013-03-06: 100 ug via INTRAVENOUS
  Administered 2013-03-06 (×4): 50 ug via INTRAVENOUS
  Administered 2013-03-06: 100 ug via INTRAVENOUS

## 2013-03-06 MED ORDER — ALBUMIN HUMAN 5 % IV SOLN
INTRAVENOUS | Status: DC | PRN
  Administered 2013-03-06: 22:00:00 via INTRAVENOUS

## 2013-03-06 MED ORDER — SODIUM CHLORIDE 0.9 % IV SOLN
INTRAVENOUS | Status: DC | PRN
  Administered 2013-03-06: 22:00:00 via INTRAVENOUS

## 2013-03-06 SURGICAL SUPPLY — 65 items
BAG DECANTER FOR FLEXI CONT (MISCELLANEOUS) ×2 IMPLANT
BENZOIN TINCTURE PRP APPL 2/3 (GAUZE/BANDAGES/DRESSINGS) IMPLANT
BLADE SURG ROTATE 9660 (MISCELLANEOUS) IMPLANT
BONE VOID FILLER STRIP 10CC (Bone Implant) ×4 IMPLANT
BUR MATCHSTICK NEURO 3.0 LAGG (BURR) ×2 IMPLANT
CANISTER SUCTION 2500CC (MISCELLANEOUS) ×2 IMPLANT
CLOTH BEACON ORANGE TIMEOUT ST (SAFETY) ×2 IMPLANT
CONT SPEC 4OZ CLIKSEAL STRL BL (MISCELLANEOUS) ×2 IMPLANT
COVER BACK TABLE 24X17X13 BIG (DRAPES) IMPLANT
DECANTER SPIKE VIAL GLASS SM (MISCELLANEOUS) ×2 IMPLANT
DERMABOND ADVANCED (GAUZE/BANDAGES/DRESSINGS) ×1
DERMABOND ADVANCED .7 DNX12 (GAUZE/BANDAGES/DRESSINGS) ×1 IMPLANT
DRAPE C-ARM 42X72 X-RAY (DRAPES) ×4 IMPLANT
DRAPE C-ARMOR (DRAPES) ×2 IMPLANT
DRAPE LAPAROTOMY 100X72X124 (DRAPES) ×2 IMPLANT
DRAPE POUCH INSTRU U-SHP 10X18 (DRAPES) ×2 IMPLANT
DRAPE SURG 17X23 STRL (DRAPES) ×2 IMPLANT
DRESSING TELFA 8X3 (GAUZE/BANDAGES/DRESSINGS) ×4 IMPLANT
DURAPREP 26ML APPLICATOR (WOUND CARE) ×2 IMPLANT
ELECT REM PT RETURN 9FT ADLT (ELECTROSURGICAL) ×2
ELECTRODE REM PT RTRN 9FT ADLT (ELECTROSURGICAL) ×1 IMPLANT
GAUZE SPONGE 4X4 16PLY XRAY LF (GAUZE/BANDAGES/DRESSINGS) ×2 IMPLANT
GLOVE BIO SURGEON STRL SZ 6.5 (GLOVE) ×6 IMPLANT
GLOVE BIO SURGEON STRL SZ7 (GLOVE) ×2 IMPLANT
GLOVE ECLIPSE 6.5 STRL STRAW (GLOVE) ×4 IMPLANT
GLOVE ECLIPSE 8.5 STRL (GLOVE) ×2 IMPLANT
GLOVE EXAM NITRILE LRG STRL (GLOVE) IMPLANT
GLOVE EXAM NITRILE MD LF STRL (GLOVE) IMPLANT
GLOVE EXAM NITRILE XL STR (GLOVE) IMPLANT
GLOVE EXAM NITRILE XS STR PU (GLOVE) IMPLANT
GLOVE INDICATOR 6.5 STRL GRN (GLOVE) ×2 IMPLANT
GLOVE INDICATOR 7.5 STRL GRN (GLOVE) ×2 IMPLANT
GLOVE INDICATOR 8.5 STRL (GLOVE) ×2 IMPLANT
GOWN BRE IMP SLV AUR LG STRL (GOWN DISPOSABLE) ×6 IMPLANT
GOWN BRE IMP SLV AUR XL STRL (GOWN DISPOSABLE) IMPLANT
GOWN STRL REIN 2XL LVL4 (GOWN DISPOSABLE) IMPLANT
KIT BASIN OR (CUSTOM PROCEDURE TRAY) ×2 IMPLANT
KIT POSITION SURG JACKSON T1 (MISCELLANEOUS) ×2 IMPLANT
KIT ROOM TURNOVER OR (KITS) ×2 IMPLANT
NEEDLE HYPO 25X1 1.5 SAFETY (NEEDLE) ×2 IMPLANT
NEEDLE SPNL 18GX3.5 QUINCKE PK (NEEDLE) ×2 IMPLANT
NS IRRIG 1000ML POUR BTL (IV SOLUTION) ×2 IMPLANT
PACK LAMINECTOMY NEURO (CUSTOM PROCEDURE TRAY) ×2 IMPLANT
PAD ARMBOARD 7.5X6 YLW CONV (MISCELLANEOUS) ×6 IMPLANT
ROD TI 5.5MMX20CM (Rod) ×4 IMPLANT
SCREW 35MM PEDICLE (Screw) ×4 IMPLANT
SCREW 50MM (Screw) ×6 IMPLANT
SCREW BONE SPINE 30MM (Screw) ×4 IMPLANT
SCREW SET SPINAL STD HEXALOBE (Screw) ×14 IMPLANT
SPONGE GAUZE 4X4 12PLY (GAUZE/BANDAGES/DRESSINGS) ×2 IMPLANT
SPONGE LAP 4X18 X RAY DECT (DISPOSABLE) IMPLANT
SPONGE SURGIFOAM ABS GEL 100 (HEMOSTASIS) ×2 IMPLANT
STAPLER SKIN PROX WIDE 3.9 (STAPLE) ×2 IMPLANT
STRIP CLOSURE SKIN 1/2X4 (GAUZE/BANDAGES/DRESSINGS) IMPLANT
SUT PROLENE 6 0 BV (SUTURE) IMPLANT
SUT VIC AB 0 CT1 18XCR BRD8 (SUTURE) ×2 IMPLANT
SUT VIC AB 0 CT1 8-18 (SUTURE) ×2
SUT VIC AB 2-0 CP2 18 (SUTURE) ×2 IMPLANT
SUT VIC AB 2-0 CT1 18 (SUTURE) ×6 IMPLANT
SUT VIC AB 3-0 SH 8-18 (SUTURE) ×4 IMPLANT
SYR 20ML ECCENTRIC (SYRINGE) ×2 IMPLANT
TAPE CLOTH SURG 4X10 WHT LF (GAUZE/BANDAGES/DRESSINGS) ×2 IMPLANT
TOWEL OR 17X24 6PK STRL BLUE (TOWEL DISPOSABLE) ×2 IMPLANT
TOWEL OR 17X26 10 PK STRL BLUE (TOWEL DISPOSABLE) ×2 IMPLANT
WATER STERILE IRR 1000ML POUR (IV SOLUTION) ×2 IMPLANT

## 2013-03-06 NOTE — Anesthesia Procedure Notes (Signed)
Procedure Name: Intubation Date/Time: 03/06/2013 8:28 PM Performed by: Trixie Deis A Pre-anesthesia Checklist: Patient identified, Timeout performed, Emergency Drugs available, Suction available and Patient being monitored Patient Re-evaluated:Patient Re-evaluated prior to inductionOxygen Delivery Method: Circle system utilized Preoxygenation: Pre-oxygenation with 100% oxygen Intubation Type: IV induction and Rapid sequence Laryngoscope Size: Mac and 3 Grade View: Grade I Tube type: Oral Tube size: 7.0 mm Number of attempts: 1 Airway Equipment and Method: Stylet and LTA kit utilized Placement Confirmation: ETT inserted through vocal cords under direct vision,  breath sounds checked- equal and bilateral and positive ETCO2 Secured at: 21 cm Tube secured with: Tape Dental Injury: Teeth and Oropharynx as per pre-operative assessment

## 2013-03-06 NOTE — Preoperative (Signed)
Beta Blockers   Reason not to administer Beta Blockers:Not Applicable 

## 2013-03-06 NOTE — H&P (Signed)
BP 157/73  Pulse 104  Resp 19  Ht 4\' 8"  (1.422 m)  Wt 58.5 kg (128 lb 15.5 oz)  BMI 28.93 kg/m2  SpO2 98%  Rita Thomas is a 79 y.o. female With metastatic disease to T11-L1 from known breast CA. The lesion at T11/12 has already received 3000cgy to the vertebral bodies, though this has not halted the progression of the disease. She also has been receiving chemotherapy without any real improvement. Recently she has complained of increasing pain in the lower back and an MRI from 03/02/13 showed tumor progression, with cord compression. [name] was seen in our office by Dr. Karie Chimera. He was reviewing the films and felt she could wait for definitive surgery on the 17th of this month.   She was noted to have increasing weakness in the lower extremities by her daughter. She called our office today and felt that the surgery would need to be done quicker than that. I have arranged for her transfer to [facility] for a thoracic laminectomy and pedicle screw fixation. No Known Allergies Prior to Admission medications   Medication Sig Start Date End Date Taking? Authorizing Provider  acetaminophen (TYLENOL) 325 MG tablet Take 650 mg by mouth every 6 (six) hours as needed for pain.   Yes Historical Provider, MD  Calcium Carbonate-Vitamin D (CALTRATE 600+D PO) Take 1 tablet by mouth 2 (two) times daily.   Yes Historical Provider, MD  cholecalciferol (VITAMIN D) 1000 UNITS tablet Take 1,000 Units by mouth daily.   Yes Historical Provider, MD  cyclobenzaprine (FLEXERIL) 5 MG tablet Take 5 mg by mouth every 8 (eight) hours as needed for muscle spasms.   Yes Historical Provider, MD  fentaNYL (DURAGESIC - DOSED MCG/HR) 100 MCG/HR Place 1 patch onto the skin every 3 (three) days. Uses along with a 75 mcg patch to equal 175 mcg every 72 hours.   Yes Historical Provider, MD  fentaNYL (DURAGESIC - DOSED MCG/HR) 75 MCG/HR Place 1 patch onto the skin every 3 (three) days. Uses along with a 100 mcg patch to  equal 175 mcg every 72 hours.   Yes Historical Provider, MD  insulin aspart (NOVOLOG) 100 UNIT/ML injection Inject 0-12 Units into the skin 3 (three) times daily with meals. Sliding scale   Yes Historical Provider, MD  lidocaine (LIDODERM) 5 % Place 1 patch onto the skin daily. Remove & Discard patch within 12 hours or as directed by MD   Yes Historical Provider, MD  lisinopril (PRINIVIL,ZESTRIL) 20 MG tablet Take 20 mg by mouth daily.   Yes Historical Provider, MD  morphine (MS CONTIN) 15 MG 12 hr tablet Take 15 mg by mouth every 12 (twelve) hours.   Yes Historical Provider, MD  Multiple Vitamin (MULTIVITAMIN WITH MINERALS) TABS Take 1 tablet by mouth daily.   Yes Historical Provider, MD  oxyCODONE (ROXICODONE) 15 MG immediate release tablet Take 15 mg by mouth every 4 (four) hours as needed for pain.   Yes Historical Provider, MD  pantoprazole (PROTONIX) 40 MG tablet Take 40 mg by mouth daily.   Yes Historical Provider, MD  vitamin C (ASCORBIC ACID) 500 MG tablet Take 500 mg by mouth daily.   Yes Historical Provider, MD  Zinc 50 MG CAPS Take 1 capsule by mouth daily.   Yes Historical Provider, MD   No family history on file. History   Social History  . Marital Status: Legally Separated    Spouse Name: N/A    Number of Children: N/A  .  Years of Education: N/A   Occupational History  . Not on file.   Social History Main Topics  . Smoking status: Not on file  . Smokeless tobacco: Not on file  . Alcohol Use: No  . Drug Use: No  . Sexually Active: No   Other Topics Concern  . Not on file   Social History Narrative  . No narrative on file    Past Medical Hx: Breast CA, diabetes, HTN Past Surgical Hx: Mastectomy, Cataract removal Phys Exam: Alert and oriented x 4, speech is clear and fluent Perrl, full eom Symmetric facial sensation, symmetric facial movements Hearing intact to voice Uvula elevates in the midline, normal shoulder shrug, tongue protrudes in the midline 5/5  strength in the upper extremities Weakness R>L lower extremities, at least 4/5 in all muscle groups Normal muscle tone and bulk Intact proprioception upper and lower extremities Lungs clear Heart regular rhythm and rate Radiology: Mri shows cord compression and collapse at T11,12. Minor involvement at L1, no structural issues at L1.  Kyphotic at L11/12 disc space, 11/12 both enhance with contrast with epidural tumor anterior to the cord.  A/P Rita Thomas is a 79 y.o. female with metastatic breast ca to the thoracic lumbar junction. I do believe most of her pain is mechanical in nature. She may still be a candidate for radiosurgical treatment at that level despite previous irradiation, thus the decompression via a laminectomy might still be helpful. Nevertheless the decompression will allow for the cord to have more space. Risks and benefits including infection, paralysis, weakness, fusion failure, hardware failure, need for further surgery. She understands and wishes to proceed.

## 2013-03-06 NOTE — Anesthesia Preprocedure Evaluation (Addendum)
Anesthesia Evaluation  Patient identified by MRN, date of birth, ID band Patient awake    Reviewed: Allergy & Precautions, H&P , NPO status , Patient's Chart, lab work & pertinent test results, reviewed documented beta blocker date and time   Airway Mallampati: II TM Distance: >3 FB Neck ROM: full    Dental   Pulmonary neg pulmonary ROS,  breath sounds clear to auscultation        Cardiovascular hypertension, On Medications Rhythm:regular     Neuro/Psych  Neuromuscular disease negative psych ROS   GI/Hepatic Neg liver ROS, hiatal hernia, GERD-  Medicated and Controlled,  Endo/Other  diabetes, Insulin Dependent  Renal/GU negative Renal ROS  negative genitourinary   Musculoskeletal   Abdominal   Peds  Hematology negative hematology ROS (+)   Anesthesia Other Findings See surgeon's H&P   Reproductive/Obstetrics negative OB ROS                           Anesthesia Physical Anesthesia Plan  ASA: III and emergent  Anesthesia Plan: General   Post-op Pain Management:    Induction: Intravenous  Airway Management Planned: Oral ETT  Additional Equipment:   Intra-op Plan:   Post-operative Plan: Extubation in OR  Informed Consent: I have reviewed the patients History and Physical, chart, labs and discussed the procedure including the risks, benefits and alternatives for the proposed anesthesia with the patient or authorized representative who has indicated his/her understanding and acceptance.   Dental Advisory Given  Plan Discussed with: CRNA and Surgeon  Anesthesia Plan Comments:        Anesthesia Quick Evaluation

## 2013-03-07 ENCOUNTER — Inpatient Hospital Stay (HOSPITAL_COMMUNITY): Payer: Medicare Other

## 2013-03-07 DIAGNOSIS — C7951 Secondary malignant neoplasm of bone: Secondary | ICD-10-CM | POA: Diagnosis not present

## 2013-03-07 DIAGNOSIS — C7949 Secondary malignant neoplasm of other parts of nervous system: Secondary | ICD-10-CM | POA: Diagnosis not present

## 2013-03-07 DIAGNOSIS — C50919 Malignant neoplasm of unspecified site of unspecified female breast: Secondary | ICD-10-CM | POA: Diagnosis not present

## 2013-03-07 DIAGNOSIS — G992 Myelopathy in diseases classified elsewhere: Secondary | ICD-10-CM | POA: Diagnosis not present

## 2013-03-07 LAB — TYPE AND SCREEN
ABO/RH(D): O POS
Antibody Screen: NEGATIVE
Unit division: 0
Unit division: 0

## 2013-03-07 LAB — GLUCOSE, CAPILLARY
Glucose-Capillary: 267 mg/dL — ABNORMAL HIGH (ref 70–99)
Glucose-Capillary: 214 mg/dL — ABNORMAL HIGH (ref 70–99)
Glucose-Capillary: 188 mg/dL — ABNORMAL HIGH (ref 70–99)

## 2013-03-07 LAB — POCT I-STAT 4, (NA,K, GLUC, HGB,HCT)
Glucose, Bld: 155 mg/dL — ABNORMAL HIGH (ref 70–99)
HCT: 22 % — ABNORMAL LOW (ref 36.0–46.0)
Hemoglobin: 10.9 g/dL — ABNORMAL LOW (ref 12.0–15.0)
Potassium: 3.6 mEq/L (ref 3.5–5.1)
Sodium: 131 mEq/L — ABNORMAL LOW (ref 135–145)
Sodium: 130 mEq/L — ABNORMAL LOW (ref 135–145)

## 2013-03-07 LAB — HEMOGLOBIN A1C: Mean Plasma Glucose: 140 mg/dL — ABNORMAL HIGH (ref <117)

## 2013-03-07 MED ORDER — ONDANSETRON HCL 4 MG/2ML IJ SOLN
INTRAMUSCULAR | Status: DC | PRN
  Administered 2013-03-07: 4 mg via INTRAVENOUS

## 2013-03-07 MED ORDER — BIOTENE DRY MOUTH MT LIQD
15.0000 mL | Freq: Two times a day (BID) | OROMUCOSAL | Status: DC
  Administered 2013-03-08: 15 mL via OROMUCOSAL

## 2013-03-07 MED ORDER — INSULIN ASPART 100 UNIT/ML ~~LOC~~ SOLN
0.0000 [IU] | Freq: Three times a day (TID) | SUBCUTANEOUS | Status: DC
  Administered 2013-03-07: 3 [IU] via SUBCUTANEOUS
  Administered 2013-03-07 – 2013-03-08 (×3): 5 [IU] via SUBCUTANEOUS
  Administered 2013-03-08: 2 [IU] via SUBCUTANEOUS
  Administered 2013-03-08 – 2013-03-09 (×3): 5 [IU] via SUBCUTANEOUS
  Administered 2013-03-09: 3 [IU] via SUBCUTANEOUS
  Administered 2013-03-10: 5 [IU] via SUBCUTANEOUS
  Administered 2013-03-10 (×2): 3 [IU] via SUBCUTANEOUS
  Administered 2013-03-11: 2 [IU] via SUBCUTANEOUS
  Administered 2013-03-11: 3 [IU] via SUBCUTANEOUS

## 2013-03-07 MED ORDER — CEFAZOLIN SODIUM 1-5 GM-% IV SOLN
1.0000 g | Freq: Three times a day (TID) | INTRAVENOUS | Status: AC
  Administered 2013-03-07 (×2): 1 g via INTRAVENOUS
  Filled 2013-03-07 (×2): qty 50

## 2013-03-07 MED ORDER — ACETAMINOPHEN 10 MG/ML IV SOLN
1000.0000 mg | Freq: Four times a day (QID) | INTRAVENOUS | Status: AC
  Administered 2013-03-07 (×3): 1000 mg via INTRAVENOUS
  Filled 2013-03-07 (×5): qty 100

## 2013-03-07 MED ORDER — METOCLOPRAMIDE HCL 5 MG/ML IJ SOLN: 10.0000 mg | Freq: Once | INTRAMUSCULAR | Status: DC | PRN

## 2013-03-07 MED ORDER — SODIUM CHLORIDE 0.9 % IV SOLN: 250.0000 mL | INTRAVENOUS | Status: DC

## 2013-03-07 MED ORDER — DEXAMETHASONE 4 MG PO TABS
4.0000 mg | ORAL_TABLET | Freq: Four times a day (QID) | ORAL | Status: DC
  Administered 2013-03-07 – 2013-03-08 (×6): 4 mg via ORAL
  Filled 2013-03-07 (×11): qty 1

## 2013-03-07 MED ORDER — POTASSIUM CHLORIDE IN NACL 20-0.9 MEQ/L-% IV SOLN
INTRAVENOUS | Status: DC
  Administered 2013-03-07 – 2013-03-08 (×2): via INTRAVENOUS
  Filled 2013-03-07 (×5): qty 1000

## 2013-03-07 MED ORDER — BUPIVACAINE LIPOSOME 1.3 % IJ SUSP
INTRAMUSCULAR | Status: DC | PRN
  Administered 2013-03-07: 20 mL

## 2013-03-07 MED ORDER — HYDROMORPHONE 0.3 MG/ML IV SOLN
INTRAVENOUS | Status: DC
  Administered 2013-03-07: 04:00:00 via INTRAVENOUS
  Administered 2013-03-07: 0.4 mg via INTRAVENOUS
  Administered 2013-03-08: 0.999 mg via INTRAVENOUS
  Administered 2013-03-08: 0.399 mg via INTRAVENOUS
  Administered 2013-03-08: 0.6 mg via INTRAVENOUS
  Administered 2013-03-08: 0.399 mg via INTRAVENOUS
  Filled 2013-03-07: qty 25

## 2013-03-07 MED ORDER — LISINOPRIL 20 MG PO TABS
20.0000 mg | ORAL_TABLET | Freq: Every day | ORAL | Status: DC
  Administered 2013-03-07 – 2013-03-11 (×5): 20 mg via ORAL
  Filled 2013-03-07 (×5): qty 1

## 2013-03-07 MED ORDER — OXYCODONE HCL 5 MG PO TABS: 5.0000 mg | ORAL_TABLET | Freq: Once | ORAL | Status: DC | PRN

## 2013-03-07 MED ORDER — NEOSTIGMINE METHYLSULFATE 1 MG/ML IJ SOLN
INTRAMUSCULAR | Status: DC | PRN
  Administered 2013-03-07: 4 mg via INTRAVENOUS

## 2013-03-07 MED ORDER — ADULT MULTIVITAMIN W/MINERALS CH
1.0000 | ORAL_TABLET | Freq: Every day | ORAL | Status: DC
  Administered 2013-03-07 – 2013-03-11 (×5): 1 via ORAL
  Filled 2013-03-07 (×5): qty 1

## 2013-03-07 MED ORDER — VITAMIN D3 25 MCG (1000 UNIT) PO TABS
1000.0000 [IU] | ORAL_TABLET | Freq: Every day | ORAL | Status: DC
  Administered 2013-03-07 – 2013-03-11 (×5): 1000 [IU] via ORAL
  Filled 2013-03-07 (×5): qty 1

## 2013-03-07 MED ORDER — HYDROMORPHONE HCL PF 1 MG/ML IJ SOLN: 0.2500 mg | INTRAMUSCULAR | Status: DC | PRN

## 2013-03-07 MED ORDER — DIPHENHYDRAMINE HCL 50 MG/ML IJ SOLN: 12.5000 mg | Freq: Four times a day (QID) | INTRAMUSCULAR | Status: DC | PRN

## 2013-03-07 MED ORDER — ZINC 50 MG PO CAPS: 1.0000 | ORAL_CAPSULE | Freq: Every day | ORAL | Status: DC

## 2013-03-07 MED ORDER — VITAMIN C 500 MG PO TABS
500.0000 mg | ORAL_TABLET | Freq: Every day | ORAL | Status: DC
  Administered 2013-03-07 – 2013-03-11 (×5): 500 mg via ORAL
  Filled 2013-03-07 (×5): qty 1

## 2013-03-07 MED ORDER — CYCLOBENZAPRINE HCL 10 MG PO TABS
10.0000 mg | ORAL_TABLET | Freq: Three times a day (TID) | ORAL | Status: DC | PRN
  Administered 2013-03-08 – 2013-03-09 (×2): 10 mg via ORAL
  Filled 2013-03-07 (×3): qty 1

## 2013-03-07 MED ORDER — SODIUM CHLORIDE 0.9 % IJ SOLN: 9.0000 mL | INTRAMUSCULAR | Status: DC | PRN

## 2013-03-07 MED ORDER — NALOXONE HCL 0.4 MG/ML IJ SOLN: 0.4000 mg | INTRAMUSCULAR | Status: DC | PRN

## 2013-03-07 MED ORDER — SODIUM CHLORIDE 0.9 % IJ SOLN
3.0000 mL | Freq: Two times a day (BID) | INTRAMUSCULAR | Status: DC
  Administered 2013-03-07 – 2013-03-11 (×8): 3 mL via INTRAVENOUS

## 2013-03-07 MED ORDER — PHENOL 1.4 % MT LIQD: 1.0000 | OROMUCOSAL | Status: DC | PRN

## 2013-03-07 MED ORDER — OXYCODONE HCL 5 MG/5ML PO SOLN: 5.0000 mg | Freq: Once | ORAL | Status: DC | PRN

## 2013-03-07 MED ORDER — DIPHENHYDRAMINE HCL 12.5 MG/5ML PO ELIX
12.5000 mg | ORAL_SOLUTION | Freq: Four times a day (QID) | ORAL | Status: DC | PRN
  Filled 2013-03-07: qty 5

## 2013-03-07 MED ORDER — PANTOPRAZOLE SODIUM 40 MG PO TBEC
40.0000 mg | DELAYED_RELEASE_TABLET | Freq: Every day | ORAL | Status: DC
  Administered 2013-03-07 – 2013-03-11 (×5): 40 mg via ORAL
  Filled 2013-03-07 (×4): qty 1

## 2013-03-07 MED ORDER — INSULIN ASPART 100 UNIT/ML ~~LOC~~ SOLN: 0.0000 [IU] | Freq: Three times a day (TID) | SUBCUTANEOUS | Status: DC

## 2013-03-07 MED ORDER — ZINC SULFATE 220 (50 ZN) MG PO CAPS
220.0000 mg | ORAL_CAPSULE | Freq: Every day | ORAL | Status: DC
  Administered 2013-03-07 – 2013-03-11 (×5): 220 mg via ORAL
  Filled 2013-03-07 (×5): qty 1

## 2013-03-07 MED ORDER — ONDANSETRON HCL 4 MG/2ML IJ SOLN: 4.0000 mg | INTRAMUSCULAR | Status: DC | PRN

## 2013-03-07 MED ORDER — OXYCODONE HCL 5 MG PO TABS
5.0000 mg | ORAL_TABLET | ORAL | Status: DC | PRN
  Administered 2013-03-08: 5 mg via ORAL
  Administered 2013-03-09: 10 mg via ORAL
  Administered 2013-03-09: 5 mg via ORAL
  Administered 2013-03-10: 10 mg via ORAL
  Administered 2013-03-10: 5 mg via ORAL
  Administered 2013-03-11: 10 mg via ORAL
  Administered 2013-03-11: 5 mg via ORAL
  Filled 2013-03-07: qty 2
  Filled 2013-03-07 (×2): qty 1
  Filled 2013-03-07: qty 2
  Filled 2013-03-07: qty 1
  Filled 2013-03-07 (×2): qty 2

## 2013-03-07 MED ORDER — ONDANSETRON HCL 4 MG/2ML IJ SOLN: 4.0000 mg | Freq: Four times a day (QID) | INTRAMUSCULAR | Status: DC | PRN

## 2013-03-07 MED ORDER — SENNA 8.6 MG PO TABS
1.0000 | ORAL_TABLET | Freq: Two times a day (BID) | ORAL | Status: DC
  Administered 2013-03-07 – 2013-03-11 (×9): 8.6 mg via ORAL
  Filled 2013-03-07 (×12): qty 1

## 2013-03-07 MED ORDER — MORPHINE SULFATE ER 15 MG PO TBCR
15.0000 mg | EXTENDED_RELEASE_TABLET | Freq: Two times a day (BID) | ORAL | Status: DC
  Administered 2013-03-07 – 2013-03-11 (×9): 15 mg via ORAL
  Filled 2013-03-07 (×9): qty 1

## 2013-03-07 MED ORDER — MENTHOL 3 MG MT LOZG: 1.0000 | LOZENGE | OROMUCOSAL | Status: DC | PRN

## 2013-03-07 MED ORDER — SODIUM CHLORIDE 0.9 % IJ SOLN: 3.0000 mL | INTRAMUSCULAR | Status: DC | PRN

## 2013-03-07 MED ORDER — GLYCOPYRROLATE 0.2 MG/ML IJ SOLN
INTRAMUSCULAR | Status: DC | PRN
  Administered 2013-03-07: 0.6 mg via INTRAVENOUS

## 2013-03-07 NOTE — Op Note (Signed)
03/06/2013 - 03/07/2013  12:51 AM  PATIENT:  Rita Thomas  79 y.o. female with metastatic breast cancer, spinal cord compression, presents with decreasing strength.   PRE-OPERATIVE DIAGNOSIS:  T11, T12, L1 spinal metastasis  POST-OPERATIVE DIAGNOSIS:  T11, T12, L1 spinal metastasis  PROCEDURE:  Procedure(s):thoracic laminectomy for decompression of epidural tumor, partial resection epidural metastatic tumor Thoracolumbar arthrodesis t9-L3, morselized allograft Segmental pedicle screw fixation T9-L3, Alphatek instrumentation  SURGEON:  Surgeon(s): Winfield Cunas, MD  ASSISTANTS:none   ANESTHESIA:   general  EBL:  Total I/O In: 3800 [I.V.:2850; Blood:700; IV Piggyback:250] Out: [id number] [Urine:1450; Blood:300]  BLOOD ADMINISTERED:700 CC PRBC  CELL SAVER GIVEN:none  COUNT:per nursing  DRAINS: none   SPECIMEN:  Source of Specimen:  epidural space  DICTATION: [name] was brought to the operating room, intubated and placed under a general anesthetic. She had a foley catheter placed under sterile conditions. She was positioned prone on a Jackson table with all pressure points padded. Her back was prepped and draped in a sterile fashion. I infiltrated 20cc lidocaine into the planned incision spanning the thoracolumbar spine. I opened the skin with a 10 blade and took the incision down to the thoracolumbar fascia. I then exposed the lamina bilaterally from L3 to T9. I exposed the transverse processes of L2,and 3 bilaterally, and the pedicle entry sites at T9, and 10.  I placed pedicle screws bilaterally at T9,10, and L3. I only placed one pedicle screw at L2 on the right side. For each screw I used fluoroscopic guidance to drill my entry site, then I sounded each pedicle with the pedicle probes. I did not appreciate cutouts at that point. I then tapped each pedicle using a 5.74mm tap in the thoracic spine, and a 6.75mm tap in the lumbar spine. The left side of L2 when tapped felt quite  wobbly. When I inspected the hole there was an obvious cutout laterally. Not needing the screw I simply packed the hole with gelfoam. I placed screws in all the other holes without difficulty. I checked there position with fluoroscopy and all looked good in the lateral and AP planes.  I then decompressed the spinal canal and took some specimen at T11,and 12. I performed complete laminectomies and partial facetectomies to free the thecal sac of any posterior pressure. I did not plan nor try to resect the vertebral bodies. With the decompression completed using Kerrison punches and the Leksell rongeur I now undertook the arthrodesis.  I decorticated the lamina, and facets from T9-L3 bilaterally. I used nexus allograft to place on the decorticated bone to complete the arthrodesis.  I placed the rods and locking caps to complete the instrumentation connecting the pedicle screws.  I irrigated the wound then closed approximating the thoracolumbar fascia, subcutaneous and subcuticular planes with vicryl sutures. I approximated the skin edges with staples. I applied a sterile dressing. She was extubated without difficulty.   PLAN OF CARE: Admit to inpatient   PATIENT DISPOSITION:  PACU - hemodynamically stable.   Delay start of Pharmacological VTE agent (>24hrs) due to surgical blood loss or risk of bleeding:  yes

## 2013-03-07 NOTE — Progress Notes (Signed)
Orthopedic Tech Progress Note Patient Details:  Rita Thomas 1932/02/11 [id number] [employer] called for brace order Patient ID: Gwen Pounds, female   DOB: [birth year], 79 y.o.   MRN: [id number]   Fenton Foy 03/07/2013, 10:11 AM

## 2013-03-07 NOTE — Transfer of Care (Signed)
Immediate Anesthesia Transfer of Care Note  Patient: Rita Thomas  Procedure(s) Performed: Procedure(s) with comments: POSTERIOR LUMBAR FUSION 4 LEVEL (N/A) - T9-L3 posterior fusion with posterolateral arthrodesis and posterior segmental instrumentation  Patient Location: PACU  Anesthesia Type:General  Level of Consciousness: sedated, patient cooperative and responds to stimulation  Airway & Oxygen Therapy: Patient Spontanous Breathing and Patient connected to nasal cannula oxygen  Post-op Assessment: Report given to PACU RN and Post -op Vital signs reviewed and stable  Post vital signs: Reviewed and stable  Complications: No apparent anesthesia complications

## 2013-03-07 NOTE — Anesthesia Postprocedure Evaluation (Signed)
Anesthesia Post Note  Patient: Rita Thomas  Procedure(s) Performed: Procedure(s) (LRB): POSTERIOR LUMBAR FUSION 4 LEVEL (N/A)  Anesthesia type: general  Patient location: PACU  Post pain: Pain level controlled  Post assessment: Patient's Cardiovascular Status Stable  Last Vitals:  Filed Vitals:   03/07/13 0600  BP: 113/64  Pulse: 80  Temp:   Resp: 17    Post vital signs: Reviewed and stable  Level of consciousness: sedated  Complications: No apparent anesthesia complications

## 2013-03-07 NOTE — Progress Notes (Signed)
Patient ID: Rita Thomas, female   DOB: 1931-10-18, 79 y.o.   MRN: [id number] BP 107/60  Pulse 80  Temp(Src) 98 F (36.7 C) (Oral)  Resp 16  Ht 4\' 8"  (1.422 m)  Wt 58.5 kg (128 lb 15.5 oz)  BMI 28.93 kg/m2  SpO2 95% Alert and oriented x 4 Increased strength in lower extremities.  Dressing dry, and intact Doing well, continue with pt

## 2013-03-07 NOTE — Evaluation (Signed)
Physical Therapy Evaluation Patient Details Name: Rita Thomas MRN: [id number] DOB: [birth year] Today's Date: 03/07/2013 Time: [id number] PT Time Calculation (min): 39 min  PT Assessment / Plan / Recommendation Clinical Impression  Pt had a short hospitalization in April due to back pain.  Since that hospital stay she has been in a skilled nursing facility for rehab, where she has presented with continued worsening of BLE weakness and tingling B hands.  Acute PT indicated to initiate mobility training and maximize function prior to d/c to next venue of care.  Pt wishes to return to previous skilled nursing facility for continued rehab.    PT Assessment  Patient needs continued PT services    Follow Up Recommendations  SNF    Does the patient have the potential to tolerate intense rehabilitation      Barriers to Discharge None      Equipment Recommendations  Rolling walker with 5" wheels    Recommendations for Other Services     Frequency Min 5X/week    Precautions / Restrictions Precautions Precautions: Back Precaution Comments: Educated pt on 3/3 back precautions. Required Braces or Orthoses: Spinal Brace Spinal Brace: Lumbar corset;Applied in sitting position   Pertinent Vitals/Pain 4/10      Mobility  Bed Mobility Bed Mobility: Rolling Right;Right Sidelying to Sit Rolling Right: 4: Min assist Right Sidelying to Sit: 3: Mod assist;HOB elevated;With rails Details for Bed Mobility Assistance: verbal cues for log roll Transfers Transfers: Sit to Stand;Stand to Sit Sit to Stand: 3: Mod assist;From bed;With upper extremity assist Stand to Sit: 3: Mod assist;To chair/3-in-1;With upper extremity assist Details for Transfer Assistance: verbal cues for hand placement Ambulation/Gait Ambulation/Gait Assistance: 4: Min assist Ambulation Distance (Feet): 5 Feet Assistive device: Rolling walker Gait Pattern: Antalgic;Decreased stride length Gait velocity: decreased     Exercises     PT Diagnosis: Difficulty walking;Generalized weakness;Acute pain  PT Problem List: Decreased strength;Decreased activity tolerance;Decreased balance;Decreased mobility;Decreased knowledge of precautions;Pain PT Treatment Interventions: DME instruction;Gait training;Functional mobility training;Therapeutic activities;Balance training;Patient/family education   PT Goals Acute Rehab PT Goals PT Goal Formulation: With patient Time For Goal Achievement: 03/14/13 Potential to Achieve Goals: Good Pt will go Supine/Side to Sit: with min assist;with HOB 0 degrees PT Goal: Supine/Side to Sit - Progress: Goal set today Pt will go Sit to Supine/Side: with min assist;with HOB 0 degrees PT Goal: Sit to Supine/Side - Progress: Goal set today Pt will go Sit to Stand: with min assist;with upper extremity assist PT Goal: Sit to Stand - Progress: Goal set today Pt will go Stand to Sit: with supervision;with upper extremity assist PT Goal: Stand to Sit - Progress: Goal set today Pt will Transfer Bed to Chair/Chair to Bed: with min assist PT Transfer Goal: Bed to Chair/Chair to Bed - Progress: Goal set today Pt will Ambulate: 51 - 150 feet;with min assist;with rolling walker PT Goal: Ambulate - Progress: Goal set today  Visit Information  Last PT Received On: 03/07/13 Assistance Needed: +1 (+2 to ambulate)    Subjective Data  Subjective: "I have less pain than before surgery." Patient Stated Goal: home   Prior Functioning  Home Living Lives With: Alone Available Help at Discharge: Family;Available 24 hours/day Type of Home: House Home Access: Ramped entrance Home Layout: One level Bathroom Shower/Tub: Chiropodist: Standard Home Adaptive Equipment: Straight cane Prior Function Level of Independence: Independent Able to Take Stairs?: Yes Driving: No Vocation: Retired 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    Restaurant manager, fast food Arousal/Alertness:  Awake/alert Behavior During Therapy: WFL for tasks assessed/performed Overall Cognitive Status: Within Functional Limits for tasks assessed    Extremity/Trunk Assessment     Balance    End of Session PT - End of Session Equipment Utilized During Treatment: Gait belt;Back brace Activity Tolerance: Patient tolerated treatment well Patient left: in chair;with call bell/phone within reach;with nursing in room Nurse Communication: Mobility status  GP     Lorriane Shire 03/07/2013, 3:53 PM  Lorrin Goodell, PT  Office # [phone] Pager [phone]

## 2013-03-08 LAB — GLUCOSE, CAPILLARY: Glucose-Capillary: 261 mg/dL — ABNORMAL HIGH (ref 70–99)

## 2013-03-08 MED ORDER — METFORMIN HCL 500 MG PO TABS
500.0000 mg | ORAL_TABLET | Freq: Two times a day (BID) | ORAL | Status: DC
  Administered 2013-03-08 – 2013-03-11 (×6): 500 mg via ORAL
  Filled 2013-03-08 (×8): qty 1

## 2013-03-08 MED ORDER — DEXAMETHASONE 4 MG PO TABS
4.0000 mg | ORAL_TABLET | Freq: Three times a day (TID) | ORAL | Status: DC
  Administered 2013-03-08 – 2013-03-09 (×3): 4 mg via ORAL
  Filled 2013-03-08 (×5): qty 1

## 2013-03-08 NOTE — Progress Notes (Signed)
Physical Therapy Treatment Patient Details Name: Rita Thomas MRN: [id number] DOB: [birth year] Today's Date: 03/08/2013 Time: [id number] PT Time Calculation (min): 13 min  PT Assessment / Plan / Recommendation Comments on Treatment Session  Unable to complete gait training this session due to pt breakfast tray arriving.  She requested eating breakfast prior to further mobility.    Follow Up Recommendations  SNF     Does the patient have the potential to tolerate intense rehabilitation     Barriers to Discharge        Equipment Recommendations  Rolling walker with 5" wheels    Recommendations for Other Services    Frequency Min 5X/week   Plan Discharge plan remains appropriate;Frequency remains appropriate    Precautions / Restrictions Precautions Precautions: Back Precaution Comments: reviewed 3/3 back precautions Required Braces or Orthoses: Spinal Brace Spinal Brace: Lumbar corset;Applied in sitting position   Pertinent Vitals/Pain 4/10    Mobility  Transfers Sit to Stand: 3: Mod assist;From bed;With upper extremity assist Stand to Sit: 3: Mod assist;To chair/3-in-1;With armrests Details for Transfer Assistance: verbal cues for sequencing, hand placement Ambulation/Gait Ambulation/Gait Assistance: 4: Min assist Ambulation Distance (Feet): 5 Feet Assistive device: Rolling walker Ambulation/Gait Assistance Details: assist with RW management Gait Pattern: Antalgic;Decreased stride length;Narrow base of support Gait velocity: decreased    Exercises     PT Diagnosis:    PT Problem List:   PT Treatment Interventions:     PT Goals Acute Rehab PT Goals PT Goal: Supine/Side to Sit - Progress: Progressing toward goal PT Goal: Sit to Supine/Side - Progress: Progressing toward goal PT Goal: Sit to Stand - Progress: Progressing toward goal PT Goal: Stand to Sit - Progress: Progressing toward goal PT Transfer Goal: Bed to Chair/Chair to Bed - Progress: Progressing toward  goal PT Goal: Ambulate - Progress: Progressing toward goal  Visit Information  Last PT Received On: 03/08/13 Assistance Needed: +1    Subjective Data  Subjective: "I didn't sleep well." Patient Stated Goal: home   Cognition  Cognition Arousal/Alertness: Awake/alert Behavior During Therapy: WFL for tasks assessed/performed Overall Cognitive Status: Within Functional Limits for tasks assessed    Balance     End of Session PT - End of Session Equipment Utilized During Treatment: Gait belt;Back brace Activity Tolerance: Patient tolerated treatment well Patient left: in chair;with call bell/phone within reach;with family/visitor present Nurse Communication: Mobility status   GP     Rita Thomas 03/08/2013, 9:57 AM  Lorrin Goodell, PT  Office # [phone] Pager [phone]

## 2013-03-08 NOTE — Plan of Care (Signed)
Problem: Consults Goal: Diagnosis - Spinal Surgery Lumbar Laminectomy (Complex)     

## 2013-03-08 NOTE — Progress Notes (Signed)
Patient ID: Rita Thomas, female   DOB: 11/30/1931, 79 y.o.   MRN: [id number] BP 156/75  Pulse 118  Temp(Src) 98.2 F (36.8 C) (Oral)  Resp 18  Ht 4\' 8"  (1.422 m)  Wt 61.1 kg (134 lb 11.2 oz)  BMI 30.22 kg/m2  SpO2 98% Alert, fluent speech Moving all extremities well Dressing is stained, dry. Transfer today

## 2013-03-08 NOTE — Progress Notes (Signed)
Physical Therapy Treatment Patient Details Name: Rita Thomas MRN: [id number] DOB: Dec 19, 1931 Today's Date: 03/08/2013 Time: [id number] PT Time Calculation (min): 20 min  PT Assessment / Plan / Recommendation Comments on Treatment Session  HR increased to 130 during gait, recovering to 107 after treatment.  O2 sats dropped during activity to upper 70s requiring constant verbal cueing for deep breaths.  With cueing O2 sats increased to  99%.    Follow Up Recommendations  SNF     Does the patient have the potential to tolerate intense rehabilitation     Barriers to Discharge        Equipment Recommendations  Rolling walker with 5" wheels    Recommendations for Other Services    Frequency Min 5X/week   Plan Discharge plan remains appropriate;Frequency remains appropriate    Precautions / Restrictions Precautions Precautions: Back Precaution Comments: reviewed 3/3 back precautions Required Braces or Orthoses: Spinal Brace Spinal Brace: Lumbar corset;Applied in sitting position   Pertinent Vitals/Pain 4/10    Mobility  Transfers Sit to Stand: 3: Mod assist;From chair/3-in-1;With armrests Stand to Sit: 3: Mod assist;With armrests;To chair/3-in-1 Details for Transfer Assistance: verbal cues for safety, hand placement Ambulation/Gait Ambulation/Gait Assistance: 4: Min assist Ambulation Distance (Feet): 15 Feet (x 2) Assistive device: Rolling walker Ambulation/Gait Assistance Details: assist with RW management Gait Pattern: Decreased stride length;Narrow base of support;Antalgic Gait velocity: decreased    Exercises     PT Diagnosis:    PT Problem List:   PT Treatment Interventions:     PT Goals Acute Rehab PT Goals PT Goal: Supine/Side to Sit - Progress: Progressing toward goal PT Goal: Sit to Supine/Side - Progress: Progressing toward goal PT Goal: Sit to Stand - Progress: Progressing toward goal PT Goal: Stand to Sit - Progress: Progressing toward goal PT Transfer  Goal: Bed to Chair/Chair to Bed - Progress: Progressing toward goal PT Goal: Ambulate - Progress: Progressing toward goal  Visit Information  Last PT Received On: 03/08/13 Assistance Needed: +1    Subjective Data  Subjective: "I can walk better in my sneakers." Patient Stated Goal: home   Cognition  Cognition Arousal/Alertness: Awake/alert Behavior During Therapy: WFL for tasks assessed/performed Overall Cognitive Status: Within Functional Limits for tasks assessed    Balance     End of Session PT - End of Session Equipment Utilized During Treatment: Gait belt;Back brace Activity Tolerance: Patient tolerated treatment well Patient left: in chair;with call bell/phone within reach Nurse Communication: Mobility status   GP     Lorriane Shire 03/08/2013, 10:04 AM  Lorrin Goodell, PT  Office # [phone] Pager [phone]

## 2013-03-09 ENCOUNTER — Encounter (HOSPITAL_COMMUNITY): Payer: Self-pay | Admitting: *Deleted

## 2013-03-09 LAB — GLUCOSE, CAPILLARY
Glucose-Capillary: 253 mg/dL — ABNORMAL HIGH (ref 70–99)
Glucose-Capillary: 219 mg/dL — ABNORMAL HIGH (ref 70–99)
Glucose-Capillary: 206 mg/dL — ABNORMAL HIGH (ref 70–99)
Glucose-Capillary: 213 mg/dL — ABNORMAL HIGH (ref 70–99)

## 2013-03-09 MED ORDER — DEXAMETHASONE 4 MG PO TABS
4.0000 mg | ORAL_TABLET | Freq: Two times a day (BID) | ORAL | Status: DC
  Administered 2013-03-09 – 2013-03-11 (×4): 4 mg via ORAL
  Filled 2013-03-09 (×5): qty 1

## 2013-03-09 NOTE — Progress Notes (Signed)
Clinical Social Work Department BRIEF PSYCHOSOCIAL ASSESSMENT 03/09/2013  Patient:  Rita Thomas, Rita Thomas     Account Number:  [id number]     Admit date:  03/06/2013  Clinical Social Worker:  Ulyess Blossom  Date/Time:  03/09/2013 02:30 PM  Referred by:  Physician  Date Referred:  03/09/2013 Referred for  SNF Placement   Other Referral:   Interview type:  Patient Other interview type:    PSYCHOSOCIAL DATA Living Status:  FACILITY Admitted from facility:  [facility] Level of care:  [place] Primary support name:  Bethena Roys Joyce/daughter/[phone] Primary support relationship to patient:  CHILD, ADULT Degree of support available:   adequate    CURRENT CONCERNS Current Concerns  Post-Acute Placement   Other Concerns:    SOCIAL WORK ASSESSMENT / PLAN CSW received referral for SNF    CSW met with pt at bedside. Pt reports that she admitted to the hospital from [facility] and plans to return to [facility] at d/c.    [place] spoke with [facility] who confirmed that pt can return at d/c.    CSW to continue to follow and facilitate pt d/c needs when pt medically stable for d/c.   Assessment/plan status:  Psychosocial Support/Ongoing Assessment of Needs Other assessment/ plan:   discharge planning   Information/referral to community resources:   Referral back to [place]: Pt alert and oriented x 4. Pt agreeable to return to [facility].       Drake Leach, MSW, [place] Social Work [phone]

## 2013-03-09 NOTE — Progress Notes (Signed)
Physical Therapy Treatment Patient Details Name: Rita Thomas MRN: [id number] DOB: January 02, 1932 Today's Date: 03/09/2013 Time: [id number] PT Time Calculation (min): 25 min  PT Assessment / Plan / Recommendation Comments on Treatment Session  Pt with good motivation, requires frequent rests due to fatigue.  Improved activity tolerance with gait noted as she has progressed to gait 35' today.    Follow Up Recommendations  SNF     Does the patient have the potential to tolerate intense rehabilitation     Barriers to Discharge        Equipment Recommendations       Recommendations for Other Services    Frequency Min 5X/week   Plan Discharge plan remains appropriate;Frequency remains appropriate    Precautions / Restrictions Precautions Precautions: Back Required Braces or Orthoses: Spinal Brace Spinal Brace: Lumbar corset Restrictions Weight Bearing Restrictions: No   Pertinent Vitals/Pain Pt reports 0/10 pain, states she had pain medication before session    Mobility  Transfers Sit to Stand: 4: Min assist;With armrests Stand to Sit: 4: Min guard Ambulation/Gait Ambulation/Gait Assistance: 4: Min assist Ambulation Distance (Feet): 35 Feet Assistive device: Rolling walker Ambulation/Gait Assistance Details: Pt with B hip weakness, trendelenberg gait pattern, decreased step length B, pt tends to have increased knee flexion when fatigued, requires min A for wt shifts and balance when fatigued    Exercises General Exercises - Lower Extremity Ankle Circles/Pumps: AROM;20 reps;Both Long Arc Quad: AROM;Both;10 reps Hip ABduction/ADduction: AROM;10 reps;Both Hip Flexion/Marching: AROM;Both;10 reps Other Exercises Other Exercises: glute squeezes x 10, sit to stand x 3 for strengthening with min A progressing to close supervision with repetition   PT Diagnosis:    PT Problem List:   PT Treatment Interventions:     PT Goals Acute Rehab PT Goals PT Goal: Sit to Stand -  Progress: Met PT Goal: Stand to Sit - Progress: Progressing toward goal PT Transfer Goal: Bed to Chair/Chair to Bed - Progress: Met PT Goal: Ambulate - Progress: Progressing toward goal  Visit Information  Last PT Received On: 03/09/13 Assistance Needed: +1    Subjective Data  Subjective: I'm ready   Cognition  Cognition Behavior During Therapy: [facility] for tasks assessed/performed Overall Cognitive Status: Within Functional Limits for tasks assessed    Balance     End of Session PT - End of Session Equipment Utilized During Treatment: Gait belt;Back brace Activity Tolerance: Patient tolerated treatment well Patient left: in chair;with call bell/phone within reach;with family/visitor present   GP     Rita Thomas 03/09/2013, 10:10 AM

## 2013-03-09 NOTE — Progress Notes (Signed)
Patient ID: Rita Thomas, female   DOB: Jun 22, 1932, 79 y.o.   MRN: [id number] BP 133/78  Pulse 100  Temp(Src) 97.8 F (36.6 C) (Oral)  Resp 18  Ht 4\' 8"  (1.422 m)  Wt 61.1 kg (134 lb 11.2 oz)  BMI 30.22 kg/m2  SpO2 100% Alert and oriented x 4 Moving lower extremities well Wound is clean and dry.  Possible discharge tomorrow

## 2013-03-09 NOTE — Progress Notes (Signed)
Inpatient Diabetes Program Recommendations  AACE/ADA: New Consensus Statement on Inpatient Glycemic Control (2013)  Target Ranges:  Prepandial:   less than 140 mg/dL      Peak postprandial:   less than 180 mg/dL (1-2 hours)      Critically ill patients:  140 - 180 mg/dL   Reason for Visit: Note CBG's elevated post-surgery.  Patient is on PO Decadron.  A1C=6.5% indicating good control of blood glucose prior to admit.  Consider adding Novolog meal coverage 3 units tid with meals (Hold if patient eats less than 50%) while on PO steroids.  Will follow.

## 2013-03-09 NOTE — Evaluation (Signed)
Occupational Therapy Evaluation Patient Details Name: Rita Thomas MRN: [id number] DOB: 12/31/1931 Today's Date: 03/09/2013 Time: 1002-1030 OT Time Calculation (min): 28 min  OT Assessment / Plan / Recommendation Clinical Impression   This 79 y.o. Female admitted with T11, T12, L1 spinal metastasis.  Pt underwent thoracic laminectomy for decompression of epidural tumor.  Pt. Has been in SNF since April.  She demonstrates the below listed deficits and will benefit from acute OT to maximize safety and independence with BADLs to min A.  Pt for return to SNF for continued rehab.     OT Assessment  Patient needs continued OT Services    Follow Up Recommendations  SNF    Barriers to Discharge Decreased caregiver support    Equipment Recommendations  None recommended by OT    Recommendations for Other Services    Frequency  Min 2X/week    Precautions / Restrictions Precautions Precautions: Back Required Braces or Orthoses: Spinal Brace Spinal Brace: Lumbar corset Restrictions Weight Bearing Restrictions: No       ADL  Eating/Feeding: Independent Where Assessed - Eating/Feeding: Chair Grooming: Wash/dry hands;Wash/dry face;Denture care;Set up Where Assessed - Grooming: Supported sitting Upper Body Bathing: Minimal assistance Where Assessed - Upper Body Bathing: Supported sitting Lower Body Bathing: Maximal assistance Where Assessed - Lower Body Bathing: Supported sit to stand Upper Body Dressing: Minimal assistance Where Assessed - Upper Body Dressing: Unsupported sitting;Supported sitting Lower Body Dressing: +1 Total assistance Where Assessed - Lower Body Dressing: Supported sit to Lobbyist: Moderate assistance Toilet Transfer Method: Sit to stand;Stand pivot Toilet Transfer Equipment: Bedside commode;Grab bars Toileting - Clothing Manipulation and Hygiene: +1 Total assistance Where Assessed - Toileting Clothing Manipulation and Hygiene: Standing Equipment  Used: Back brace;Rolling walker Transfers/Ambulation Related to ADLs: Pt requires mod A with RW. Pt fatigues and demonstrates decreased safety ADL Comments: Pt attempted to sit on commode before she was adequately turned, or positioned to do so, placing her at risk for fall.  She is unable to access feet by crossing ankles over knees    OT Diagnosis: Generalized weakness;Acute pain  OT Problem List: Decreased strength;Decreased activity tolerance;Impaired balance (sitting and/or standing);Decreased safety awareness;Decreased knowledge of use of DME or AE;Decreased knowledge of precautions;Pain OT Treatment Interventions: Self-care/ADL training;DME and/or AE instruction;Therapeutic activities;Visual/perceptual remediation/compensation;Patient/family education;Balance training   OT Goals Acute Rehab OT Goals OT Goal Formulation: With patient Time For Goal Achievement: 03/16/13 Potential to Achieve Goals: Good ADL Goals Pt Will Perform Grooming: with min assist;Standing at sink ADL Goal: Grooming - Progress: Goal set today Pt Will Perform Lower Body Bathing: with mod assist;Sit to stand from chair;Sit to stand from bed ADL Goal: Lower Body Bathing - Progress: Goal set today Pt Will Perform Lower Body Dressing: with mod assist;Sit to stand from chair;Sit to stand from bed;with adaptive equipment ADL Goal: Lower Body Dressing - Progress: Goal set today Pt Will Transfer to Toilet: with min assist;Ambulation;with DME ADL Goal: Toilet Transfer - Progress: Goal set today Pt Will Perform Toileting - Clothing Manipulation: with min assist;Standing ADL Goal: Toileting - Clothing Manipulation - Progress: Goal set today  Visit Information  Last OT Received On: 03/09/13 Assistance Needed: +1    Subjective Data  Subjective: "I get really tired: Patient Stated Goal: To get stronger   Prior Functioning     Home Living Lives With: Alone Available Help at Discharge: Family;Skilled Nursing  Facility Type of Home: House Home Access: Ramped entrance [place]: One level Bathroom Shower/Tub: Chiropodist: Standard  Home Adaptive Equipment: Straight cane Prior Function Level of Independence: Independent Able to Take Stairs?: Yes Driving: No Vocation: Retired Comments: Pt was indepedent until April. She has been at SNF since and reports she has needed variable assistance with BADLs Communication Communication: No difficulties         Vision/Perception     Cognition  Cognition Arousal/Alertness: Awake/alert Behavior During Therapy: WFL for tasks assessed/performed Overall Cognitive Status: Within Functional Limits for tasks assessed    Extremity/Trunk Assessment Right Upper Extremity Assessment RUE ROM/Strength/Tone: Within functional levels RUE Coordination: WFL - gross/fine motor Left Upper Extremity Assessment LUE ROM/Strength/Tone: Deficits LUE ROM/Strength/Tone Deficits: Pt with lymphedema LUE Coordination: WFL - gross/fine motor Trunk Assessment Trunk Assessment: Normal     Mobility Bed Mobility Bed Mobility: Not assessed Transfers Transfers: Sit to Stand;Stand to Sit Sit to Stand: 4: Min assist;With upper extremity assist;3: Mod assist;From chair/3-in-1;From toilet Stand to Sit: 3: Mod assist;With upper extremity assist;To chair/3-in-1;To toilet Details for Transfer Assistance: min a initially, but mod A as she fatigues.  Requires cues/assist for safety and hand placement     Exercise General Exercises - Lower Extremity Ankle Circles/Pumps: AROM;20 reps;Both Long Arc Quad: AROM;Both;10 reps Hip ABduction/ADduction: AROM;10 reps;Both Hip Flexion/Marching: AROM;Both;10 reps Other Exercises Other Exercises: glute squeezes x 10, sit to stand x 3 for strengthening with min A progressing to close supervision with repetition   Balance     End of Session OT - End of Session Equipment Utilized During Treatment: Back brace Activity  Tolerance: Patient limited by fatigue Patient left: in chair;with call bell/phone within reach  Rita Thomas 03/09/2013, 1:41 PM

## 2013-03-10 ENCOUNTER — Encounter (HOSPITAL_COMMUNITY): Payer: Self-pay | Admitting: Neurosurgery

## 2013-03-10 LAB — BASIC METABOLIC PANEL
CO2: 28 mEq/L (ref 19–32)
Chloride: 90 mEq/L — ABNORMAL LOW (ref 96–112)
Creatinine, Ser: 0.42 mg/dL — ABNORMAL LOW (ref 0.50–1.10)

## 2013-03-10 LAB — GLUCOSE, CAPILLARY
Glucose-Capillary: 211 mg/dL — ABNORMAL HIGH (ref 70–99)
Glucose-Capillary: 212 mg/dL — ABNORMAL HIGH (ref 70–99)

## 2013-03-10 MED ORDER — SODIUM CHLORIDE 0.9 % IJ SOLN: 10.0000 mL | Freq: Two times a day (BID) | INTRAMUSCULAR | Status: DC

## 2013-03-10 MED ORDER — SODIUM CHLORIDE 0.9 % IJ SOLN
10.0000 mL | INTRAMUSCULAR | Status: DC | PRN
  Administered 2013-03-10 – 2013-03-11 (×4): 10 mL

## 2013-03-10 NOTE — Progress Notes (Signed)
Physical Therapy Treatment Patient Details Name: Rita Thomas MRN: [id number] DOB: 1932/08/15 Today's Date: 03/10/2013 Time: [id number] PT Time Calculation (min): 24 min  PT Assessment / Plan / Recommendation Comments on Treatment Session  Progressing nicely. Fatigues quickly however used seated rest to progress ambulation distance and activity tolerance. Lower extremities and trunk very weak.     Follow Up Recommendations  SNF     Does the patient have the potential to tolerate intense rehabilitation     Barriers to Discharge        Equipment Recommendations  Rolling walker with 5" wheels    Recommendations for Other Services    Frequency Min 5X/week   Plan Discharge plan remains appropriate;Frequency remains appropriate    Precautions / Restrictions Precautions Precautions: Back;Fall Precaution Comments: reviewed 3/3 back precautions Required Braces or Orthoses: Spinal Brace Spinal Brace: Lumbar corset;Applied in sitting position   Pertinent Vitals/Pain Reports lower back pain, repositioned her in the chair in more straight back posture with feet supported     Mobility  Bed Mobility Bed Mobility: Not assessed Transfers Transfers: Sit to Stand;Stand to Sit Sit to Stand: 4: Min assist;With upper extremity assist;From chair/3-in-1;From toilet;With armrests Stand to Sit: 4: Min assist;With upper extremity assist;With armrests Details for Transfer Assistance: sit<>stand x4 throughout session for required rest breaks, cues for safe hand placement and proper positioning for safe descent; propels herself forward with swayback type posture and needs upper extremity support to bring weight forward and stabiilze over RW Ambulation/Gait Ambulation/Gait Assistance: 4: Min assist Ambulation Distance (Feet): 90 Feet (30 ft x3 with seated rest in between) Assistive device: Rolling walker Ambulation/Gait Assistance Details: cues for safe positioning within RW, as she fatigues knees  begin to flexed and needed one modA to correct LOB posteriorly Gait Pattern: Decreased stride length;Narrow base of support;Antalgic;Trendelenburg Gait velocity: decreased General Gait Details: decreased step height    Exercises General Exercises - Lower Extremity Long Arc Quad: AROM;Both;10 reps;Seated    PT Goals Acute Rehab PT Goals Pt will go Sit to Stand: with modified independence PT Goal: Sit to Stand - Progress: Updated due to goal met PT Goal: Stand to Sit - Progress: Progressing toward goal PT Transfer Goal: Bed to Chair/Chair to Bed - Progress: Progressing toward goal Pt will Ambulate: 51 - 150 feet;with rolling walker;with supervision PT Goal: Ambulate - Progress: Updated due to goal met  Visit Information  Last PT Received On: 03/10/13 Assistance Needed: +1    Subjective Data  Subjective: I would like to move around a little bit.    Cognition  Cognition Arousal/Alertness: Awake/alert Behavior During Therapy: WFL for tasks assessed/performed Overall Cognitive Status: Within Functional Limits for tasks assessed    Balance     End of Session PT - End of Session Equipment Utilized During Treatment: Gait belt;Back brace Activity Tolerance: Patient tolerated treatment well Patient left: in chair;with call bell/phone within reach;with family/visitor present Nurse Communication: Mobility status   GP     [facility] HELEN 03/10/2013, 1:24 PM

## 2013-03-10 NOTE — Progress Notes (Signed)
Patient ID: Rita Thomas, female   DOB: Feb 28, 1932, 79 y.o.   MRN: [id number] BP 152/86  Pulse 94  Temp(Src) 98.2 F (36.8 C) (Oral)  Resp 18  Ht 4\' 8"  (1.422 m)  Wt 61.1 kg (134 lb 11.2 oz)  BMI 30.22 kg/m2  SpO2 97% Alert and oriented x 4 Moving all extremities well Will discharge tomorrow. Wound is clean and dry.  Doing very well

## 2013-03-11 ENCOUNTER — Other Ambulatory Visit: Payer: Self-pay | Admitting: Radiation Therapy

## 2013-03-11 DIAGNOSIS — K219 Gastro-esophageal reflux disease without esophagitis: Secondary | ICD-10-CM | POA: Diagnosis not present

## 2013-03-11 DIAGNOSIS — R269 Unspecified abnormalities of gait and mobility: Secondary | ICD-10-CM | POA: Diagnosis not present

## 2013-03-11 DIAGNOSIS — E871 Hypo-osmolality and hyponatremia: Secondary | ICD-10-CM | POA: Diagnosis not present

## 2013-03-11 DIAGNOSIS — Z794 Long term (current) use of insulin: Secondary | ICD-10-CM | POA: Diagnosis not present

## 2013-03-11 DIAGNOSIS — R279 Unspecified lack of coordination: Secondary | ICD-10-CM | POA: Diagnosis not present

## 2013-03-11 DIAGNOSIS — C7952 Secondary malignant neoplasm of bone marrow: Secondary | ICD-10-CM | POA: Diagnosis not present

## 2013-03-11 DIAGNOSIS — M8448XA Pathological fracture, other site, initial encounter for fracture: Secondary | ICD-10-CM | POA: Diagnosis not present

## 2013-03-11 DIAGNOSIS — C493 Malignant neoplasm of connective and soft tissue of thorax: Secondary | ICD-10-CM | POA: Diagnosis not present

## 2013-03-11 DIAGNOSIS — M6281 Muscle weakness (generalized): Secondary | ICD-10-CM | POA: Diagnosis not present

## 2013-03-11 DIAGNOSIS — Z5189 Encounter for other specified aftercare: Secondary | ICD-10-CM | POA: Diagnosis not present

## 2013-03-11 DIAGNOSIS — M5380 Other specified dorsopathies, site unspecified: Secondary | ICD-10-CM | POA: Diagnosis not present

## 2013-03-11 DIAGNOSIS — Z923 Personal history of irradiation: Secondary | ICD-10-CM | POA: Diagnosis not present

## 2013-03-11 DIAGNOSIS — Z9221 Personal history of antineoplastic chemotherapy: Secondary | ICD-10-CM | POA: Diagnosis not present

## 2013-03-11 DIAGNOSIS — E119 Type 2 diabetes mellitus without complications: Secondary | ICD-10-CM | POA: Diagnosis not present

## 2013-03-11 DIAGNOSIS — Z981 Arthrodesis status: Secondary | ICD-10-CM | POA: Diagnosis not present

## 2013-03-11 DIAGNOSIS — M545 Low back pain: Secondary | ICD-10-CM | POA: Diagnosis not present

## 2013-03-11 DIAGNOSIS — R609 Edema, unspecified: Secondary | ICD-10-CM | POA: Diagnosis not present

## 2013-03-11 DIAGNOSIS — Z9010 Acquired absence of unspecified breast and nipple: Secondary | ICD-10-CM | POA: Diagnosis not present

## 2013-03-11 DIAGNOSIS — I10 Essential (primary) hypertension: Secondary | ICD-10-CM | POA: Diagnosis not present

## 2013-03-11 DIAGNOSIS — C7951 Secondary malignant neoplasm of bone: Secondary | ICD-10-CM | POA: Diagnosis not present

## 2013-03-11 DIAGNOSIS — C50919 Malignant neoplasm of unspecified site of unspecified female breast: Secondary | ICD-10-CM | POA: Diagnosis not present

## 2013-03-11 DIAGNOSIS — Z79899 Other long term (current) drug therapy: Secondary | ICD-10-CM | POA: Diagnosis not present

## 2013-03-11 DIAGNOSIS — R293 Abnormal posture: Secondary | ICD-10-CM | POA: Diagnosis not present

## 2013-03-11 DIAGNOSIS — Z780 Asymptomatic menopausal state: Secondary | ICD-10-CM | POA: Diagnosis not present

## 2013-03-11 DIAGNOSIS — M549 Dorsalgia, unspecified: Secondary | ICD-10-CM | POA: Diagnosis not present

## 2013-03-11 DIAGNOSIS — C801 Malignant (primary) neoplasm, unspecified: Secondary | ICD-10-CM | POA: Diagnosis not present

## 2013-03-11 DIAGNOSIS — G893 Neoplasm related pain (acute) (chronic): Secondary | ICD-10-CM | POA: Diagnosis not present

## 2013-03-11 DIAGNOSIS — E785 Hyperlipidemia, unspecified: Secondary | ICD-10-CM | POA: Diagnosis not present

## 2013-03-11 DIAGNOSIS — K922 Gastrointestinal hemorrhage, unspecified: Secondary | ICD-10-CM | POA: Diagnosis not present

## 2013-03-11 LAB — GLUCOSE, CAPILLARY: Glucose-Capillary: 200 mg/dL — ABNORMAL HIGH (ref 70–99)

## 2013-03-11 NOTE — Progress Notes (Deleted)
Patient for d/c today to SNF bed at [facility] as pta.  Family to transport by car- Eduard Clos, MSW, [place]

## 2013-03-11 NOTE — Discharge Summary (Signed)
Physician Discharge Summary  Patient ID: Rita Thomas MRN: [id number] DOB/AGE: [birth year] 77 y.o.  Admit date: 03/06/2013 Discharge date: 03/11/2013  Admission Diagnoses: T11,12,L1 metastatic cancer, Breast CA primary, spinal cord compression  Discharge Diagnoses: [id number] metastatic cancer, Breast CA primary, spinal cord compression Active Problems:   * No active hospital problems. *   Discharged Condition: good  Hospital Course: Rita Thomas was admitted to the hospital for spinal cord compression and progressive lower extremity weakness due to metastatic breast cancer involving the T11,12, and L1 vertebral bodies. She underwent a thoracic laminectomy and arthrodesis with instrumentation. Post operatively she has had less pain and improved strength in the lower extremities. Her wound is clean, dry, and without signs of infection. She is voiding, and ambulating with assistance. She will be transferred to the [place] home where she was prior to this admission.   Consults: none  Significant Diagnostic Studies: none  Treatments: surgery: thoracic laminectomy for decompression of epidural tumor, partial resection epidural metastatic tumor  Thoracolumbar arthrodesis t9-L3, morselized allograft  Segmental pedicle screw fixation T9-L3, Alphatek instrumentation   Discharge Exam: Blood pressure 147/89, pulse 88, temperature 98.8 F (37.1 C), temperature source Oral, resp. rate 20, height 4\' 8"  (1.422 m), weight 61.1 kg (134 lb 11.2 oz), SpO2 99.00%. General appearance: alert, cooperative and appears stated age Neurologic: Mental status: Alert, oriented, thought content appropriate Cranial nerves: normal Motor: increasing strength in the lower extremities. Able to ambulate with assistance.  Disposition: Final discharge disposition not confirmed   Future Appointments Provider Department Dept Phone   03/16/2013 2:30 PM [place]  [phone]   03/16/2013 3:00 PM Lora Paula, MD [place] [phone]   03/19/2013 3:00 PM Lora Paula, MD [place] [phone]   Joint Appt [facility] RAD[facility] ONCOLOG[facility] [phone]       Medication List    ASK your doctor about these medications       acetaminophen 325 MG tablet  Commonly known as:  TYLENOL  Take 650 mg by mouth every 6 (six) hours as needed for pain.     CALTRATE 600+D PO  Take 1 tablet by mouth 2 (two) times daily.     cholecalciferol 1000 UNITS tablet  Commonly known as:  VITAMIN D  Take 1,000 Units by mouth daily.     cyclobenzaprine 5 MG tablet  Commonly known as:  FLEXERIL  Take 5 mg by mouth every 8 (eight) hours as needed for muscle spasms.     fentaNYL 100 MCG/HR  Commonly known as:  DURAGESIC - dosed mcg/hr  Place 1 patch onto the skin every 3 (three) days. Uses along with a 75 mcg patch to equal 175 mcg every 72 hours.     fentaNYL 75 MCG/HR  Commonly known as:  DURAGESIC - dosed mcg/hr  Place 1 patch onto the skin every 3 (three) days. Uses along with a 100 mcg patch to equal 175 mcg every 72 hours.     insulin aspart 100 UNIT/ML injection  Commonly known as:  novoLOG  Inject 0-12 Units into the skin 3 (three) times daily with meals. Sliding scale     lidocaine 5 %  Commonly known as:  LIDODERM  Place 1 patch onto the skin daily. Remove & Discard patch within 12 hours or as directed by MD     lisinopril 20 MG tablet  Commonly known as:  PRINIVIL,ZESTRIL  Take 20 mg by mouth daily.     morphine 15 MG 12 hr tablet  Commonly known as:  MS CONTIN  Take 15 mg by mouth every 12 (twelve) hours.     multivitamin with minerals Tabs  Take 1 tablet by mouth daily.     oxyCODONE 15 MG immediate release tablet  Commonly known as:  ROXICODONE  Take 15 mg by mouth every 4 (four) hours as needed for pain.     pantoprazole 40  MG tablet  Commonly known as:  PROTONIX  Take 40 mg by mouth daily.     vitamin C 500 MG tablet  Commonly known as:  ASCORBIC ACID  Take 500 mg by mouth daily.     Zinc 50 MG Caps  Take 1 capsule by mouth daily.           Follow-up Information   Follow up with Beryle Zeitz L, MD In 1 month. (call to make appointment, will need xrays of the thoracolumbar spine)    Contact information:   [address], [address]                         [address] [place] [address] [phone]       Signed: Jaselle Pryer L 03/11/2013, 11:41 AM

## 2013-03-11 NOTE — Progress Notes (Signed)
Patient for d/c today to SNF bed at  [facility] as pta.  Family and patient agreeable to this plan- [place]

## 2013-03-11 NOTE — Care Management Note (Signed)
    Page 1 of 1   03/11/2013     3:55:20 PM   CARE MANAGEMENT NOTE 03/11/2013  Patient:  Rita Thomas, Rita Thomas   Account Number:  [id number]  Date Initiated:  03/09/2013  Documentation initiated by:  [place] Plan  Subjective/Objective Assessment:   admitted postop op thoracic laminectomy with resection of epidural tumor     Action/Plan:   PT eval-recommended SNF   Anticipated DC Date:  03/12/2013   Anticipated DC Plan:  SKILLED NURSING FACILITY  In-house referral  Clinical Social Worker      DC Planning Services  CM consult      Choice offered to / List presented to:             Status of service:  Completed, signed off Medicare Important Message given?   (If response is "NO", the following Medicare IM given date fields will be blank) Date Medicare IM given:   Date Additional Medicare IM given:    Discharge Disposition:  [place]  Per UR Regulation:  Reviewed for med. necessity/level of care/duration of stay  If discussed at [place] of Stay Meetings, dates discussed:    Comments:

## 2013-03-11 NOTE — Progress Notes (Signed)
[facility] SNF and gave report to the nurse. Pt is alert and in her base line, assisted to dressed up. Incision is dry and intact with staples on. Gave analgesic to help her for the ride. No concerns. Gershon Crane

## 2013-03-11 NOTE — Progress Notes (Signed)
Physical Therapy Treatment Patient Details Name: Rita Thomas MRN: [id number] DOB: 1932/02/17 Today's Date: 03/11/2013 Time: [id number] PT Time Calculation (min): 14 min  PT Assessment / Plan / Recommendation Comments on Treatment Session  Pt able to tolerate increased ambulation distance today.      Follow Up Recommendations  SNF     Does the patient have the potential to tolerate intense rehabilitation     Barriers to Discharge        Equipment Recommendations  Rolling walker with 5" wheels    Recommendations for Other Services    Frequency Min 5X/week   Plan Discharge plan remains appropriate;Frequency remains appropriate    Precautions / Restrictions Precautions Precautions: Back;Fall Precaution Comments: reviewed 3/3 back precautions Required Braces or Orthoses: Spinal Brace Spinal Brace: Lumbar corset;Applied in sitting position       Mobility  Bed Mobility Bed Mobility: Not assessed Transfers Transfers: Sit to Stand;Stand to Sit Sit to Stand: 4: Min guard;With upper extremity assist;With armrests;From chair/3-in-1 Stand to Sit: 4: Min guard;With upper extremity assist;With armrests;To chair/3-in-1 Details for Transfer Assistance: cues to reinforce hand placement.   Ambulation/Gait Ambulation/Gait Assistance: 4: Min guard Ambulation Distance (Feet): 120 Feet (60' x 2) Assistive device: Rolling walker Ambulation/Gait Assistance Details: Guarding for safety.  Pt tolerated increased distance today.  Rt knee slightly flexed during stance phase.   Gait Pattern: Step-through pattern;Decreased stride length Gait velocity: decreased General Gait Details: decreased step height Stairs: No Wheelchair Mobility Wheelchair Mobility: No      PT Goals Acute Rehab PT Goals Time For Goal Achievement: 03/14/13 Potential to Achieve Goals: Good Pt will go Supine/Side to Sit: with min assist;with HOB 0 degrees Pt will go Sit to Supine/Side: with min assist;with HOB 0  degrees Pt will go Sit to Stand: with modified independence PT Goal: Sit to Stand - Progress: Progressing toward goal Pt will go Stand to Sit: with supervision;with upper extremity assist PT Goal: Stand to Sit - Progress: Progressing toward goal Pt will Transfer Bed to Chair/Chair to Bed: with min assist Pt will Ambulate: 51 - 150 feet;with rolling walker;with supervision PT Goal: Ambulate - Progress: Progressing toward goal  Visit Information  Last PT Received On: 03/11/13 Assistance Needed: +1    Subjective Data      Cognition  Cognition Arousal/Alertness: Awake/alert Behavior During Therapy: WFL for tasks assessed/performed Overall Cognitive Status: Within Functional Limits for tasks assessed    Balance     End of Session PT - End of Session Equipment Utilized During Treatment: Back brace Activity Tolerance: Patient tolerated treatment well Patient left: in chair;with call bell/phone within reach;with family/visitor present Nurse Communication: Mobility status     Sarajane Marek, [place] [phone] 03/11/2013

## 2013-03-13 ENCOUNTER — Encounter: Payer: Self-pay | Admitting: Radiation Oncology

## 2013-03-13 DIAGNOSIS — C7951 Secondary malignant neoplasm of bone: Secondary | ICD-10-CM

## 2013-03-13 HISTORY — DX: Secondary malignant neoplasm of bone: C79.51

## 2013-03-13 NOTE — Progress Notes (Signed)
Histology and Location of Primary Cancer: breast  Sites of Visceral and Bony Metastatic Disease: Thoracic spine  Location(s) of Symptomatic Metastases: T11,T12 and L1 vertebral bodies  Past/Anticipated chemotherapy by medical oncology, if any: Pavlitaxel from 4/21-5/27/14 but, has been stopped due to progression of disease  Pain on a scale of 0-10 is: post operation she has had less pain and improved strength in lower extremities    If Spine Met(s), symptoms, if any, include:  Bowel/Bladder retention or incontinence (please describe): None noted  Numbness or weakness in extremities (please describe): Weakness of lower extremities noted but, improvement reports since surgery  Current Decadron regimen, if applicable: None noted  Ambulatory status? Walker? Wheelchair?: ambulate with assistance  SAFETY ISSUES:  Prior radiation? YES  Pacemaker/ICD? NO  Possible current pregnancy? NO  Is the patient on methotrexate? NO  Current Complaints / other details:  79 year old female. Legally separated. Resides at [facility].  11/17/12-11/28/12 3000 cGy in 10 fractions T11-12 spine  07/08/12-08/05/12 4000 cGy in 20 fractions medial left chest wall/parasternal region

## 2013-03-16 ENCOUNTER — Encounter: Payer: Self-pay | Admitting: Radiation Oncology

## 2013-03-16 ENCOUNTER — Ambulatory Visit
Admit: 2013-03-16 | Discharge: 2013-03-16 | Disposition: A | Discharge status: Home / Self Care | Payer: Medicare Other | Attending: Radiation Oncology | Admitting: Radiation Oncology

## 2013-03-16 VITALS — BP 171/85 | HR 106 | Temp 99.1°F | Resp 18 | Ht <= 58 in | Wt 128.0 lb

## 2013-03-16 DIAGNOSIS — Z981 Arthrodesis status: Secondary | ICD-10-CM | POA: Insufficient documentation

## 2013-03-16 DIAGNOSIS — K219 Gastro-esophageal reflux disease without esophagitis: Secondary | ICD-10-CM | POA: Insufficient documentation

## 2013-03-16 DIAGNOSIS — C7951 Secondary malignant neoplasm of bone: Secondary | ICD-10-CM

## 2013-03-16 DIAGNOSIS — Z9010 Acquired absence of unspecified breast and nipple: Secondary | ICD-10-CM | POA: Insufficient documentation

## 2013-03-16 DIAGNOSIS — E119 Type 2 diabetes mellitus without complications: Secondary | ICD-10-CM | POA: Insufficient documentation

## 2013-03-16 DIAGNOSIS — Z9221 Personal history of antineoplastic chemotherapy: Secondary | ICD-10-CM | POA: Insufficient documentation

## 2013-03-16 DIAGNOSIS — Z923 Personal history of irradiation: Secondary | ICD-10-CM | POA: Insufficient documentation

## 2013-03-16 DIAGNOSIS — Z794 Long term (current) use of insulin: Secondary | ICD-10-CM | POA: Insufficient documentation

## 2013-03-16 DIAGNOSIS — C50919 Malignant neoplasm of unspecified site of unspecified female breast: Secondary | ICD-10-CM | POA: Insufficient documentation

## 2013-03-16 DIAGNOSIS — I10 Essential (primary) hypertension: Secondary | ICD-10-CM | POA: Insufficient documentation

## 2013-03-16 DIAGNOSIS — Z79899 Other long term (current) drug therapy: Secondary | ICD-10-CM | POA: Insufficient documentation

## 2013-03-16 NOTE — Progress Notes (Signed)
Complete PATIENT MEASURE OF DISTRESS worksheet with a score of 0 submitted to social work.

## 2013-03-16 NOTE — Progress Notes (Signed)
See progress note under physician encounter. 

## 2013-03-16 NOTE — Progress Notes (Signed)
Radiation Oncology         [phone]) [phone] ________________________________  Initial outpatient Consultation  Name: Rita Thomas MRN: [id number]  Date: 03/16/2013  DOB: 1932-05-14  CC:No primary provider on file.  Winfield Cunas, MD   REFERRING PHYSICIAN: Winfield Cunas, MD  DIAGNOSIS: 79 year old woman with metastatic breast cancer status post laminectomy for spinal cord compression at T11-T12 with previous radiotherapy to this level.  HISTORY OF PRESENT ILLNESS::Rita Thomas is a 79 y.o. female who was diagnosed with bilateral breast masses on 01/23/2005. She had left mastectomy with sentinel lymph node sampling on 02/20/2005 yielding a 4.5 cm grade 1 invasive ductal carcinoma with negative margins. The patient had one lymph node submitted for pathology which was positive. The patient's surgeon was unable to identify any additional lymph nodes for submission. The patient proceeded to right sided mastectomy on 03/28/2005 revealing a 3.5 cm grade 2 invasive ductal carcinoma with one of 9 axillary lymph nodes involved.  I met with the patient in consultation on 04/26/2005 to discuss the potential for adjuvant radiotherapy with her. We talked about the fact that she was at increased risk for local or regional recurrence. However, the data to support adjuvant radiation in this setting was somewhat limited and given her age at that time of 78, she elected to forego adjuvant radiation therapy. The patient received tamoxifen until 2007 when she was found to have recurrent lymphadenopathy. She was switched to letrozole and later Faslodex. PET CT in 2012 revealed no residual disease. In routine followup, the patient's medical oncologist palpated a left parasternal mass and CT scan on 05/20/2012 revealed a 1.5 cm soft tissue mass abutting the left lateral margin of the sternum and additional PET CT revealed no sites of disease elsewhere in the body. Needle biopsy of the parasternal mass 05/16/2012 revealed  invasive ductal carcinoma. The patient consequently received localized palliative radiation from 10/08 through 08/05/2012 to 40 Gy in 20 fractions to the left chest wall mass.  In January 2014 the patient began to experience thoracic spinal pain. MRI on 11/03/2012 showed abnormal signal involving the bone marrow of T11 and T12 with some extension on the right side into the paraspinous tissues. The patient subsequently received 30 Gy in 10 fractions using conventional radiation at the [facility] to a radiation field arrangement encompassing vertebral bodies T10-L1 inclusive.  Below our some images from that treatment    The patient presented with worsening back pain in May and MRI demonstrated spinal cord compression from enhancing lesions at T11 and T12. Given her previous course of radiation therapy, the patient underwent thoracic laminectomy for decompression of epidural tumor with partial resection of epidural metastatic tumor and thoracolumbar arthrodesis from T9-L3 and segmental pedicle screw fixation from T9-L3.  Surprisingly, the patient's surgical pathology from this procedure revealed bone and associated fibrous tissue with skeletal muscle but no evidence of malignancy. The patient is currently been referred today to discuss the results of surgery and discuss potential options moving forward.  Notably, she began spiking a fever of 101 and has started on antibiotics.  PREVIOUS RADIATION THERAPY: Yes as outlined above  PAST MEDICAL HISTORY:  has a past medical history of Hypertension; Diabetes mellitus without complication; GERD (gastroesophageal reflux disease); H/O hiatal hernia; Breast cancer (2006); Metastasis to spinal cord; S/P radiation therapy; S/P chemotherapy, time since greater than 12 weeks; and Metastatic cancer to spine (03/13/2013).    PAST SURGICAL HISTORY: Past Surgical History  Procedure Laterality Date  . Eye surgery  1995    cataracts bilaterally  .  Mastectomy  2006  . Posterior lumbar fusion 4 level N/A 03/06/2013    Procedure: POSTERIOR LUMBAR FUSION 4 LEVEL;  Surgeon: Winfield Cunas, MD;  Location: [place] [facility];  Service: Neurosurgery;  Laterality: N/A;  T9-L3 posterior fusion with posterolateral arthrodesis and posterior segmental instrumentation    FAMILY HISTORY: family history includes Cancer in her brother and sisters.  SOCIAL HISTORY:  reports that she has never smoked. She has never used smokeless tobacco. She reports that she does not drink alcohol or use illicit drugs.  ALLERGIES: Review of patient's allergies indicates no known allergies.  MEDICATIONS:  Current Outpatient Prescriptions  Medication Sig Dispense Refill  . acetaminophen (TYLENOL) 325 MG tablet Take 650 mg by mouth every 6 (six) hours as needed for pain.      . Calcium Carbonate (CALTRATE 600 PO) Take by mouth.      . Calcium Carbonate-Vitamin D (CALTRATE 600+D PO) Take 1 tablet by mouth 2 (two) times daily.      . cholecalciferol (VITAMIN D) 1000 UNITS tablet Take 1,000 Units by mouth daily.      . cyclobenzaprine (FLEXERIL) 5 MG tablet Take 5 mg by mouth every 8 (eight) hours as needed for muscle spasms.      . fentaNYL (DURAGESIC - DOSED MCG/HR) 100 MCG/HR Place 1 patch onto the skin every 3 (three) days. Uses along with a 75 mcg patch to equal 175 mcg every 72 hours.      . fentaNYL (DURAGESIC - DOSED MCG/HR) 75 MCG/HR Place 1 patch onto the skin every 3 (three) days. Uses along with a 100 mcg patch to equal 175 mcg every 72 hours.      . insulin aspart (NOVOLOG) 100 UNIT/ML injection Inject 0-12 Units into the skin 3 (three) times daily with meals. Sliding scale      . lidocaine (LIDODERM) 5 % Place 1 patch onto the skin daily. Remove & Discard patch within 12 hours or as directed by MD      . lisinopril (PRINIVIL,ZESTRIL) 20 MG tablet Take 20 mg by mouth daily.      Marland Kitchen morphine (MS CONTIN) 15 MG 12 hr tablet Take 15 mg by mouth every 12 (twelve) hours.        . Multiple Vitamin (MULTIVITAMIN WITH MINERALS) TABS Take 1 tablet by mouth daily.      Marland Kitchen oxyCODONE (ROXICODONE) 15 MG immediate release tablet Take 15 mg by mouth every 4 (four) hours as needed for pain.      . pantoprazole (PROTONIX) 40 MG tablet Take 40 mg by mouth daily.      . vitamin C (ASCORBIC ACID) 500 MG tablet Take 500 mg by mouth daily.      . Zinc 50 MG CAPS Take 1 capsule by mouth daily.       No current facility-administered medications for this encounter.    REVIEW OF SYSTEMS:  A 15 point review of systems is documented in the electronic medical record. This was obtained by the nursing staff. However, I reviewed this with the patient to discuss relevant findings and make appropriate changes.  Pertinent items are noted in HPI.   PHYSICAL EXAM:  height is 4\' 8"  (1.422 m) and weight is 128 lb (58.06 kg). Her oral temperature is 99.1 F (37.3 C). Her blood pressure is 171/85 and her pulse is 106. Her respiration is 18 and oxygen saturation is 100%.   The patient presents for wheelchair  today. She is in no acute distress. She is alert and oriented. Speech is articulate and fluent. Motor strength is 5/5 in the upper lower extremities. Light touch is intact in lower extremities with no "nerve level."  LABORATORY DATA:  Lab Results  Component Value Date   HGB 7.5* 03/06/2013   HCT 22.0* 03/06/2013   Lab Results  Component Value Date   NA 127* 03/10/2013   K 4.0 03/10/2013   CL 90* 03/10/2013   CO2 28 03/10/2013   No results found for this basename: ALT, AST, GGT, ALKPHOS, BILITOT     RADIOGRAPHY: Dg Thoracolumabar Spine  03/07/2013   *RADIOLOGY REPORT*  Clinical Data: Thoracic laminectomy for decompression of both metastatic epidural tumor.  Thoracolumbar arthrodesis T9-L3.  DG C-ARM GT 120 MIN, THORACOLUMBAR SPINE - 2 VIEW  Comparison:  03/02/2013 MRI  Findings: Four intraoperative spot views of the thoracolumbar spine are submitted postoperatively for interpretation.  Posterior  rods are noted in the thoracolumbar region with bipedicular screws at T9, T10, and L3.  A right pedicular screw is present at L2. Pathologic fractures/metastases at T11 and T12 are visualized. No definite complicating features are identified.  IMPRESSION: Posterior rod and pedicular screw fixation as described from T9-L3 with T11 and T12 pathologic fractures/metastases again noted.   Original Report Authenticated By: Margarette Canada, M.D.   Dg Chest Port 1 View  03/07/2013   *RADIOLOGY REPORT*  Clinical Data: Central line placement. History of metastatic breast cancer.  PORTABLE CHEST - 1 VIEW  Comparison: 01/15/2013 and prior chest radiographs.  Findings: A left IJ central venous catheter is noted with tip overlying the mid SVC. A right Port-A-Cath is present with tip overlying the mid - lower SVC. Mild pulmonary vascular congestion is noted. Bibasilar atelectasis is present. There is no evidence of large pleural effusion or pneumothorax. Spinal hardware is again noted. Surgical clips in both axillary regions again identified.  IMPRESSION: Left IJ central venous catheter with tip overlying the mid SVC - no evidence of pneumothorax.  Pulmonary vascular congestion and mild bibasilar atelectasis.   Original Report Authenticated By: Margarette Canada, M.D.   Dg C-arm Gt 61 Min  03/07/2013   *RADIOLOGY REPORT*  Clinical Data: Thoracic laminectomy for decompression of both metastatic epidural tumor.  Thoracolumbar arthrodesis T9-L3.  DG C-ARM GT 120 MIN, THORACOLUMBAR SPINE - 2 VIEW  Comparison:  03/02/2013 MRI  Findings: Four intraoperative spot views of the thoracolumbar spine are submitted postoperatively for interpretation.  Posterior rods are noted in the thoracolumbar region with bipedicular screws at T9, T10, and L3.  A right pedicular screw is present at L2. Pathologic fractures/metastases at T11 and T12 are visualized. No definite complicating features are identified.  IMPRESSION: Posterior rod and pedicular screw  fixation as described from T9-L3 with T11 and T12 pathologic fractures/metastases again noted.   Original Report Authenticated By: Margarette Canada, M.D.     IMPRESSION: Ms. Deng is a very nice 79 year old woman with a history of metastatic breast cancer. She recently underwent dorsal laminectomy for spinal cord compression related to a mass compressing the spinal cord at the T11 and T12 level. This site was suspected to represent a metastatic deposit which appeared on MRI and PET scan in January and was treated with localized radiation in February. The lesion appeared to progress radiographically. At this point, pathology from her resection shows no viable tumor. The etiology of the mass and cord compression is not entirely clear at this time.  The lesion could represent treated  tumor with subsequent insufficiency fracture of the T11 and T12 bone with no histologically evident tumor  The lesion could represent an infectious process such as discitis or osteomyelitis which may yet require a course of antibiotic therapy.  I think ongoing surveillance for signs of infection may be reasonable. Of note, patient did have a fever earlier this week and underwent blood cultures which will be finalized tomorrow. She was empirically started on antibiotics for an the fever. Over time, if an infectious process is suspected, perhaps this can be diagnosed through blood cultures. An alternative may be to proceed with image guided biopsy of the thoracic spine for cultures. At this time, I do not feel that there is a clear role for radiation treatment.  I would recommend ongoing surveillance for infectious process with baseline spine imaging in the next 4-6 weeks.   PLAN: Today, talked with Ms. Streett and her daughters about recent findings. They were relieved that her spinal pathology did not reveal breast cancer. However, they shared the concern of our entire team about the unclear etiology of her ongoing process and  spine. They understand that it is good news that she remains neurologically intact following dorsal laminectomy for evidence of spinal cord compression. They also understand that it is good news that there was no active tumor seen in pathology. They appreciate the thoughtful care and appropriate interventions undertaken to date and are looking forward to ongoing surveillance to ensure that Ms. Limones maintains optimal health and neurologic function.   I proposed the following plan to them today:  1. Check on pending blood cultures tomorrow to assess for possible infection suggesting discitis or osteomyelitis. 2. Continue ongoing rehabilitation to optimize performance status 3. Baseline spinal imaging and 4-6 weeks with CT or MRI 4. If ongoing signs of infection persists with negative blood cultures, carefully consider the possibility of image guided spine sampling for culture.  They were satisfied about the plan.  I spent 60 minutes minutes face to face with the patient and more than 50% of that time was spent in counseling and/or coordination of care.    With updates, I will plan to call [place]  [phone] Cell     [phone]   ------------------------------------------------  Sheral Apley. Tammi Klippel, M.D.

## 2013-03-16 NOTE — Progress Notes (Signed)
Reports that she has been in rehab center "trying to get stronger" since the first of April. Presents today with back brace on. Reports that following PT this morning she "nearly fell." Blood pressure elevated. Patient reports anxiety related to doctor visit. Reports that BP maybe elevated due to anxiety of visit and almost falling. Reports tingling in both hands following chemotherapy. Daughter reports left leg is weaker than right. Took one percocet 5/325 mg tablet upon arrival for left hip, low back and left femur pain 2 on a scale of 0-10. Able to ambulate with assistance of one person and gait belt but, only short distances. Reports that she is able to control bowel and bladder. Lymphedema sleeve noted on left arm. Denies headache, dizziness, diplopia or ringing in the ears. Reports lips and mouth are so dry. Denies nausea or vomiting. Full ROM of all extremities noted.

## 2013-03-19 ENCOUNTER — Other Ambulatory Visit: Payer: Medicare Other

## 2013-03-19 ENCOUNTER — Other Ambulatory Visit: Payer: Self-pay | Admitting: Radiation Oncology

## 2013-03-19 DIAGNOSIS — C7952 Secondary malignant neoplasm of bone marrow: Secondary | ICD-10-CM

## 2013-03-19 DIAGNOSIS — C801 Malignant (primary) neoplasm, unspecified: Secondary | ICD-10-CM

## 2013-03-19 DIAGNOSIS — C7951 Secondary malignant neoplasm of bone: Secondary | ICD-10-CM

## 2013-03-19 DIAGNOSIS — M8448XA Pathological fracture, other site, initial encounter for fracture: Secondary | ICD-10-CM

## 2013-03-25 DIAGNOSIS — E785 Hyperlipidemia, unspecified: Secondary | ICD-10-CM | POA: Diagnosis not present

## 2013-03-25 DIAGNOSIS — I10 Essential (primary) hypertension: Secondary | ICD-10-CM | POA: Diagnosis not present

## 2013-03-25 DIAGNOSIS — M549 Dorsalgia, unspecified: Secondary | ICD-10-CM | POA: Diagnosis not present

## 2013-03-26 ENCOUNTER — Encounter: Payer: Self-pay | Admitting: Radiation Oncology

## 2013-03-26 DIAGNOSIS — C493 Malignant neoplasm of connective and soft tissue of thorax: Secondary | ICD-10-CM | POA: Diagnosis not present

## 2013-03-31 ENCOUNTER — Other Ambulatory Visit: Payer: Self-pay | Admitting: Neurosurgery

## 2013-03-31 DIAGNOSIS — C7951 Secondary malignant neoplasm of bone: Secondary | ICD-10-CM

## 2013-03-31 DIAGNOSIS — M545 Low back pain: Secondary | ICD-10-CM | POA: Diagnosis not present

## 2013-04-14 ENCOUNTER — Ambulatory Visit
Admission: RE | Admit: 2013-04-14 | Discharge: 2013-04-14 | Disposition: A | Discharge status: Home / Self Care | Payer: Medicare Other | Source: Ambulatory Visit | Attending: Neurosurgery | Admitting: Neurosurgery

## 2013-04-14 DIAGNOSIS — C7952 Secondary malignant neoplasm of bone marrow: Secondary | ICD-10-CM

## 2013-04-14 DIAGNOSIS — R609 Edema, unspecified: Secondary | ICD-10-CM | POA: Diagnosis not present

## 2013-04-14 DIAGNOSIS — C50919 Malignant neoplasm of unspecified site of unspecified female breast: Secondary | ICD-10-CM | POA: Diagnosis not present

## 2013-04-14 DIAGNOSIS — M8448XA Pathological fracture, other site, initial encounter for fracture: Secondary | ICD-10-CM | POA: Diagnosis not present

## 2013-04-14 MED ORDER — GADOBENATE DIMEGLUMINE 529 MG/ML IV SOLN
9.0000 mL | Freq: Once | INTRAVENOUS | Status: AC | PRN
  Administered 2013-04-14: 9 mL via INTRAVENOUS

## 2013-04-17 DIAGNOSIS — I890 Lymphedema, not elsewhere classified: Secondary | ICD-10-CM | POA: Diagnosis not present

## 2013-04-17 DIAGNOSIS — E119 Type 2 diabetes mellitus without complications: Secondary | ICD-10-CM | POA: Diagnosis not present

## 2013-04-17 DIAGNOSIS — I10 Essential (primary) hypertension: Secondary | ICD-10-CM | POA: Diagnosis not present

## 2013-04-17 DIAGNOSIS — G8929 Other chronic pain: Secondary | ICD-10-CM | POA: Diagnosis not present

## 2013-04-17 DIAGNOSIS — R269 Unspecified abnormalities of gait and mobility: Secondary | ICD-10-CM | POA: Diagnosis not present

## 2013-04-17 DIAGNOSIS — Z4789 Encounter for other orthopedic aftercare: Secondary | ICD-10-CM | POA: Diagnosis not present

## 2013-04-21 ENCOUNTER — Other Ambulatory Visit: Payer: Self-pay | Admitting: Neurosurgery

## 2013-04-21 DIAGNOSIS — I10 Essential (primary) hypertension: Secondary | ICD-10-CM | POA: Diagnosis not present

## 2013-04-21 DIAGNOSIS — I890 Lymphedema, not elsewhere classified: Secondary | ICD-10-CM | POA: Diagnosis not present

## 2013-04-21 DIAGNOSIS — Z4789 Encounter for other orthopedic aftercare: Secondary | ICD-10-CM | POA: Diagnosis not present

## 2013-04-21 DIAGNOSIS — R269 Unspecified abnormalities of gait and mobility: Secondary | ICD-10-CM | POA: Diagnosis not present

## 2013-04-21 DIAGNOSIS — M546 Pain in thoracic spine: Secondary | ICD-10-CM

## 2013-04-21 DIAGNOSIS — E119 Type 2 diabetes mellitus without complications: Secondary | ICD-10-CM | POA: Diagnosis not present

## 2013-04-21 DIAGNOSIS — G8929 Other chronic pain: Secondary | ICD-10-CM | POA: Diagnosis not present

## 2013-04-22 DIAGNOSIS — R269 Unspecified abnormalities of gait and mobility: Secondary | ICD-10-CM | POA: Diagnosis not present

## 2013-04-22 DIAGNOSIS — E119 Type 2 diabetes mellitus without complications: Secondary | ICD-10-CM | POA: Diagnosis not present

## 2013-04-22 DIAGNOSIS — Z4789 Encounter for other orthopedic aftercare: Secondary | ICD-10-CM | POA: Diagnosis not present

## 2013-04-22 DIAGNOSIS — I10 Essential (primary) hypertension: Secondary | ICD-10-CM | POA: Diagnosis not present

## 2013-04-22 DIAGNOSIS — G8929 Other chronic pain: Secondary | ICD-10-CM | POA: Diagnosis not present

## 2013-04-22 DIAGNOSIS — I890 Lymphedema, not elsewhere classified: Secondary | ICD-10-CM | POA: Diagnosis not present

## 2013-04-23 ENCOUNTER — Ambulatory Visit
Admission: RE | Admit: 2013-04-23 | Discharge: 2013-04-23 | Disposition: A | Discharge status: Home / Self Care | Payer: Medicare Other | Source: Ambulatory Visit | Attending: Neurosurgery | Admitting: Neurosurgery

## 2013-04-23 ENCOUNTER — Other Ambulatory Visit (HOSPITAL_COMMUNITY): Payer: Self-pay | Admitting: Interventional Radiology

## 2013-04-23 VITALS — BP 142/73 | HR 77 | Temp 97.0°F | Resp 18

## 2013-04-23 DIAGNOSIS — R269 Unspecified abnormalities of gait and mobility: Secondary | ICD-10-CM | POA: Diagnosis not present

## 2013-04-23 DIAGNOSIS — C7951 Secondary malignant neoplasm of bone: Secondary | ICD-10-CM

## 2013-04-23 DIAGNOSIS — M546 Pain in thoracic spine: Secondary | ICD-10-CM

## 2013-04-23 DIAGNOSIS — M519 Unspecified thoracic, thoracolumbar and lumbosacral intervertebral disc disorder: Secondary | ICD-10-CM | POA: Diagnosis not present

## 2013-04-23 DIAGNOSIS — Z4789 Encounter for other orthopedic aftercare: Secondary | ICD-10-CM | POA: Diagnosis not present

## 2013-04-23 DIAGNOSIS — I10 Essential (primary) hypertension: Secondary | ICD-10-CM | POA: Diagnosis not present

## 2013-04-23 DIAGNOSIS — C801 Malignant (primary) neoplasm, unspecified: Secondary | ICD-10-CM | POA: Diagnosis not present

## 2013-04-23 DIAGNOSIS — G8929 Other chronic pain: Secondary | ICD-10-CM | POA: Diagnosis not present

## 2013-04-23 DIAGNOSIS — M5380 Other specified dorsopathies, site unspecified: Secondary | ICD-10-CM | POA: Diagnosis not present

## 2013-04-23 DIAGNOSIS — E119 Type 2 diabetes mellitus without complications: Secondary | ICD-10-CM | POA: Diagnosis not present

## 2013-04-23 DIAGNOSIS — I890 Lymphedema, not elsewhere classified: Secondary | ICD-10-CM | POA: Diagnosis not present

## 2013-04-23 MED ORDER — KETOROLAC TROMETHAMINE 30 MG/ML IJ SOLN
30.0000 mg | Freq: Once | INTRAMUSCULAR | Status: AC
  Administered 2013-04-23: 30 mg via INTRAVENOUS

## 2013-04-23 MED ORDER — FENTANYL CITRATE 0.05 MG/ML IJ SOLN
25.0000 ug | INTRAMUSCULAR | Status: DC | PRN
  Administered 2013-04-23 (×2): 25 ug via INTRAVENOUS

## 2013-04-23 MED ORDER — SODIUM CHLORIDE 0.9 % IV SOLN
Freq: Once | INTRAVENOUS | Status: AC
  Administered 2013-04-23: 10:00:00 via INTRAVENOUS

## 2013-04-23 MED ORDER — MIDAZOLAM HCL 2 MG/2ML IJ SOLN
1.0000 mg | INTRAMUSCULAR | Status: DC | PRN
  Administered 2013-04-23 (×2): 0.5 mg via INTRAVENOUS

## 2013-04-23 NOTE — Progress Notes (Signed)
Back to nursing area, pt doing well. Daughter at bedside. Dr. Barbie Banner did speak to daughter before pt came out of procedure room. Pt's skin is warm and dry and pt is alert.

## 2013-04-23 NOTE — Discharge Instructions (Signed)
Disc Aspiration/Bone Biopsy Post Procedure Discharge Instructions  1. You may resume a regular diet and any medications that you routinely take (including pain medications). 2. No driving the day of procedure. 3. Upon discharge go home and rest for at least 4 hours.  You may use an ice pack as needed to injection sites on back. 4. Remove bandaids later today.    Please contact our office at [phone] for the following symptoms:   Fever greater than 100 degrees  Increased swelling, pain, or redness at injection site.   Thank you for visiting [facility].

## 2013-04-25 DIAGNOSIS — I890 Lymphedema, not elsewhere classified: Secondary | ICD-10-CM | POA: Diagnosis not present

## 2013-04-25 DIAGNOSIS — E119 Type 2 diabetes mellitus without complications: Secondary | ICD-10-CM | POA: Diagnosis not present

## 2013-04-25 DIAGNOSIS — I10 Essential (primary) hypertension: Secondary | ICD-10-CM | POA: Diagnosis not present

## 2013-04-25 DIAGNOSIS — G8929 Other chronic pain: Secondary | ICD-10-CM | POA: Diagnosis not present

## 2013-04-25 DIAGNOSIS — R269 Unspecified abnormalities of gait and mobility: Secondary | ICD-10-CM | POA: Diagnosis not present

## 2013-04-25 DIAGNOSIS — Z4789 Encounter for other orthopedic aftercare: Secondary | ICD-10-CM | POA: Diagnosis not present

## 2013-04-25 LAB — BODY FLUID CULTURE

## 2013-04-27 DIAGNOSIS — R269 Unspecified abnormalities of gait and mobility: Secondary | ICD-10-CM | POA: Diagnosis not present

## 2013-04-27 DIAGNOSIS — Z4789 Encounter for other orthopedic aftercare: Secondary | ICD-10-CM | POA: Diagnosis not present

## 2013-04-27 DIAGNOSIS — I10 Essential (primary) hypertension: Secondary | ICD-10-CM | POA: Diagnosis not present

## 2013-04-27 DIAGNOSIS — C50919 Malignant neoplasm of unspecified site of unspecified female breast: Secondary | ICD-10-CM | POA: Diagnosis not present

## 2013-04-27 DIAGNOSIS — G8929 Other chronic pain: Secondary | ICD-10-CM | POA: Diagnosis not present

## 2013-04-27 DIAGNOSIS — E119 Type 2 diabetes mellitus without complications: Secondary | ICD-10-CM | POA: Diagnosis not present

## 2013-04-27 DIAGNOSIS — E871 Hypo-osmolality and hyponatremia: Secondary | ICD-10-CM | POA: Diagnosis not present

## 2013-04-27 DIAGNOSIS — I890 Lymphedema, not elsewhere classified: Secondary | ICD-10-CM | POA: Diagnosis not present

## 2013-04-28 DIAGNOSIS — G8929 Other chronic pain: Secondary | ICD-10-CM | POA: Diagnosis not present

## 2013-04-28 DIAGNOSIS — C7952 Secondary malignant neoplasm of bone marrow: Secondary | ICD-10-CM | POA: Diagnosis not present

## 2013-04-28 DIAGNOSIS — Z4789 Encounter for other orthopedic aftercare: Secondary | ICD-10-CM | POA: Diagnosis not present

## 2013-04-28 DIAGNOSIS — E871 Hypo-osmolality and hyponatremia: Secondary | ICD-10-CM | POA: Diagnosis not present

## 2013-04-28 DIAGNOSIS — I890 Lymphedema, not elsewhere classified: Secondary | ICD-10-CM | POA: Diagnosis not present

## 2013-04-28 DIAGNOSIS — C7951 Secondary malignant neoplasm of bone: Secondary | ICD-10-CM

## 2013-04-28 DIAGNOSIS — E119 Type 2 diabetes mellitus without complications: Secondary | ICD-10-CM | POA: Diagnosis not present

## 2013-04-28 DIAGNOSIS — C50919 Malignant neoplasm of unspecified site of unspecified female breast: Secondary | ICD-10-CM | POA: Diagnosis not present

## 2013-04-28 DIAGNOSIS — Z5111 Encounter for antineoplastic chemotherapy: Secondary | ICD-10-CM

## 2013-04-28 DIAGNOSIS — I10 Essential (primary) hypertension: Secondary | ICD-10-CM | POA: Diagnosis not present

## 2013-04-28 DIAGNOSIS — R269 Unspecified abnormalities of gait and mobility: Secondary | ICD-10-CM | POA: Diagnosis not present

## 2013-04-29 DIAGNOSIS — R269 Unspecified abnormalities of gait and mobility: Secondary | ICD-10-CM | POA: Diagnosis not present

## 2013-04-29 DIAGNOSIS — I10 Essential (primary) hypertension: Secondary | ICD-10-CM | POA: Diagnosis not present

## 2013-04-29 DIAGNOSIS — E119 Type 2 diabetes mellitus without complications: Secondary | ICD-10-CM | POA: Diagnosis not present

## 2013-04-29 DIAGNOSIS — I890 Lymphedema, not elsewhere classified: Secondary | ICD-10-CM | POA: Diagnosis not present

## 2013-04-29 DIAGNOSIS — G8929 Other chronic pain: Secondary | ICD-10-CM | POA: Diagnosis not present

## 2013-04-29 DIAGNOSIS — Z4789 Encounter for other orthopedic aftercare: Secondary | ICD-10-CM | POA: Diagnosis not present

## 2013-04-30 DIAGNOSIS — I10 Essential (primary) hypertension: Secondary | ICD-10-CM | POA: Diagnosis not present

## 2013-05-01 DIAGNOSIS — G8929 Other chronic pain: Secondary | ICD-10-CM | POA: Diagnosis not present

## 2013-05-01 DIAGNOSIS — I10 Essential (primary) hypertension: Secondary | ICD-10-CM | POA: Diagnosis not present

## 2013-05-01 DIAGNOSIS — I890 Lymphedema, not elsewhere classified: Secondary | ICD-10-CM | POA: Diagnosis not present

## 2013-05-01 DIAGNOSIS — E119 Type 2 diabetes mellitus without complications: Secondary | ICD-10-CM | POA: Diagnosis not present

## 2013-05-01 DIAGNOSIS — R269 Unspecified abnormalities of gait and mobility: Secondary | ICD-10-CM | POA: Diagnosis not present

## 2013-05-01 DIAGNOSIS — Z4789 Encounter for other orthopedic aftercare: Secondary | ICD-10-CM | POA: Diagnosis not present

## 2013-05-04 DIAGNOSIS — C50919 Malignant neoplasm of unspecified site of unspecified female breast: Secondary | ICD-10-CM | POA: Diagnosis not present

## 2013-05-04 DIAGNOSIS — E871 Hypo-osmolality and hyponatremia: Secondary | ICD-10-CM | POA: Diagnosis not present

## 2013-05-04 DIAGNOSIS — C7951 Secondary malignant neoplasm of bone: Secondary | ICD-10-CM | POA: Diagnosis not present

## 2013-05-04 DIAGNOSIS — I890 Lymphedema, not elsewhere classified: Secondary | ICD-10-CM | POA: Diagnosis not present

## 2013-05-04 DIAGNOSIS — K8020 Calculus of gallbladder without cholecystitis without obstruction: Secondary | ICD-10-CM | POA: Diagnosis not present

## 2013-05-04 DIAGNOSIS — G8929 Other chronic pain: Secondary | ICD-10-CM | POA: Diagnosis not present

## 2013-05-04 DIAGNOSIS — Z4789 Encounter for other orthopedic aftercare: Secondary | ICD-10-CM | POA: Diagnosis not present

## 2013-05-04 DIAGNOSIS — R269 Unspecified abnormalities of gait and mobility: Secondary | ICD-10-CM | POA: Diagnosis not present

## 2013-05-04 DIAGNOSIS — I10 Essential (primary) hypertension: Secondary | ICD-10-CM | POA: Diagnosis not present

## 2013-05-04 DIAGNOSIS — J984 Other disorders of lung: Secondary | ICD-10-CM | POA: Diagnosis not present

## 2013-05-04 DIAGNOSIS — E119 Type 2 diabetes mellitus without complications: Secondary | ICD-10-CM | POA: Diagnosis not present

## 2013-05-06 DIAGNOSIS — E119 Type 2 diabetes mellitus without complications: Secondary | ICD-10-CM | POA: Diagnosis not present

## 2013-05-06 DIAGNOSIS — Z4789 Encounter for other orthopedic aftercare: Secondary | ICD-10-CM | POA: Diagnosis not present

## 2013-05-06 DIAGNOSIS — I10 Essential (primary) hypertension: Secondary | ICD-10-CM | POA: Diagnosis not present

## 2013-05-06 DIAGNOSIS — I890 Lymphedema, not elsewhere classified: Secondary | ICD-10-CM | POA: Diagnosis not present

## 2013-05-06 DIAGNOSIS — R269 Unspecified abnormalities of gait and mobility: Secondary | ICD-10-CM | POA: Diagnosis not present

## 2013-05-06 DIAGNOSIS — G8929 Other chronic pain: Secondary | ICD-10-CM | POA: Diagnosis not present

## 2013-05-08 DIAGNOSIS — I10 Essential (primary) hypertension: Secondary | ICD-10-CM | POA: Diagnosis not present

## 2013-05-08 DIAGNOSIS — G8929 Other chronic pain: Secondary | ICD-10-CM | POA: Diagnosis not present

## 2013-05-08 DIAGNOSIS — E119 Type 2 diabetes mellitus without complications: Secondary | ICD-10-CM | POA: Diagnosis not present

## 2013-05-08 DIAGNOSIS — I890 Lymphedema, not elsewhere classified: Secondary | ICD-10-CM | POA: Diagnosis not present

## 2013-05-08 DIAGNOSIS — R269 Unspecified abnormalities of gait and mobility: Secondary | ICD-10-CM | POA: Diagnosis not present

## 2013-05-08 DIAGNOSIS — Z4789 Encounter for other orthopedic aftercare: Secondary | ICD-10-CM | POA: Diagnosis not present

## 2013-05-11 DIAGNOSIS — E871 Hypo-osmolality and hyponatremia: Secondary | ICD-10-CM | POA: Diagnosis not present

## 2013-05-11 DIAGNOSIS — G8929 Other chronic pain: Secondary | ICD-10-CM | POA: Diagnosis not present

## 2013-05-11 DIAGNOSIS — I10 Essential (primary) hypertension: Secondary | ICD-10-CM | POA: Diagnosis not present

## 2013-05-11 DIAGNOSIS — R269 Unspecified abnormalities of gait and mobility: Secondary | ICD-10-CM | POA: Diagnosis not present

## 2013-05-11 DIAGNOSIS — Z4789 Encounter for other orthopedic aftercare: Secondary | ICD-10-CM | POA: Diagnosis not present

## 2013-05-11 DIAGNOSIS — C7951 Secondary malignant neoplasm of bone: Secondary | ICD-10-CM | POA: Diagnosis not present

## 2013-05-11 DIAGNOSIS — E119 Type 2 diabetes mellitus without complications: Secondary | ICD-10-CM | POA: Diagnosis not present

## 2013-05-11 DIAGNOSIS — I890 Lymphedema, not elsewhere classified: Secondary | ICD-10-CM | POA: Diagnosis not present

## 2013-05-11 DIAGNOSIS — C50919 Malignant neoplasm of unspecified site of unspecified female breast: Secondary | ICD-10-CM | POA: Diagnosis not present

## 2013-05-13 DIAGNOSIS — R269 Unspecified abnormalities of gait and mobility: Secondary | ICD-10-CM | POA: Diagnosis not present

## 2013-05-13 DIAGNOSIS — G8929 Other chronic pain: Secondary | ICD-10-CM | POA: Diagnosis not present

## 2013-05-13 DIAGNOSIS — I10 Essential (primary) hypertension: Secondary | ICD-10-CM | POA: Diagnosis not present

## 2013-05-13 DIAGNOSIS — Z4789 Encounter for other orthopedic aftercare: Secondary | ICD-10-CM | POA: Diagnosis not present

## 2013-05-13 DIAGNOSIS — I890 Lymphedema, not elsewhere classified: Secondary | ICD-10-CM | POA: Diagnosis not present

## 2013-05-13 DIAGNOSIS — E119 Type 2 diabetes mellitus without complications: Secondary | ICD-10-CM | POA: Diagnosis not present

## 2013-05-15 DIAGNOSIS — G8929 Other chronic pain: Secondary | ICD-10-CM | POA: Diagnosis not present

## 2013-05-15 DIAGNOSIS — I10 Essential (primary) hypertension: Secondary | ICD-10-CM | POA: Diagnosis not present

## 2013-05-15 DIAGNOSIS — R269 Unspecified abnormalities of gait and mobility: Secondary | ICD-10-CM | POA: Diagnosis not present

## 2013-05-15 DIAGNOSIS — E119 Type 2 diabetes mellitus without complications: Secondary | ICD-10-CM | POA: Diagnosis not present

## 2013-05-15 DIAGNOSIS — Z4789 Encounter for other orthopedic aftercare: Secondary | ICD-10-CM | POA: Diagnosis not present

## 2013-05-15 DIAGNOSIS — I890 Lymphedema, not elsewhere classified: Secondary | ICD-10-CM | POA: Diagnosis not present

## 2013-05-19 DIAGNOSIS — I890 Lymphedema, not elsewhere classified: Secondary | ICD-10-CM | POA: Diagnosis not present

## 2013-05-19 DIAGNOSIS — Z4789 Encounter for other orthopedic aftercare: Secondary | ICD-10-CM | POA: Diagnosis not present

## 2013-05-19 DIAGNOSIS — I10 Essential (primary) hypertension: Secondary | ICD-10-CM | POA: Diagnosis not present

## 2013-05-19 DIAGNOSIS — E119 Type 2 diabetes mellitus without complications: Secondary | ICD-10-CM | POA: Diagnosis not present

## 2013-05-19 DIAGNOSIS — R269 Unspecified abnormalities of gait and mobility: Secondary | ICD-10-CM | POA: Diagnosis not present

## 2013-05-19 DIAGNOSIS — G8929 Other chronic pain: Secondary | ICD-10-CM | POA: Diagnosis not present

## 2013-05-21 DIAGNOSIS — R269 Unspecified abnormalities of gait and mobility: Secondary | ICD-10-CM | POA: Diagnosis not present

## 2013-05-21 DIAGNOSIS — I890 Lymphedema, not elsewhere classified: Secondary | ICD-10-CM | POA: Diagnosis not present

## 2013-05-21 DIAGNOSIS — E119 Type 2 diabetes mellitus without complications: Secondary | ICD-10-CM | POA: Diagnosis not present

## 2013-05-21 DIAGNOSIS — I10 Essential (primary) hypertension: Secondary | ICD-10-CM | POA: Diagnosis not present

## 2013-05-21 DIAGNOSIS — Z4789 Encounter for other orthopedic aftercare: Secondary | ICD-10-CM | POA: Diagnosis not present

## 2013-05-21 DIAGNOSIS — G8929 Other chronic pain: Secondary | ICD-10-CM | POA: Diagnosis not present

## 2013-05-22 DIAGNOSIS — I10 Essential (primary) hypertension: Secondary | ICD-10-CM | POA: Diagnosis not present

## 2013-05-22 DIAGNOSIS — R269 Unspecified abnormalities of gait and mobility: Secondary | ICD-10-CM | POA: Diagnosis not present

## 2013-05-22 DIAGNOSIS — G8929 Other chronic pain: Secondary | ICD-10-CM | POA: Diagnosis not present

## 2013-05-22 DIAGNOSIS — E119 Type 2 diabetes mellitus without complications: Secondary | ICD-10-CM | POA: Diagnosis not present

## 2013-05-22 DIAGNOSIS — I890 Lymphedema, not elsewhere classified: Secondary | ICD-10-CM | POA: Diagnosis not present

## 2013-05-22 DIAGNOSIS — Z4789 Encounter for other orthopedic aftercare: Secondary | ICD-10-CM | POA: Diagnosis not present

## 2013-05-28 DIAGNOSIS — Z4789 Encounter for other orthopedic aftercare: Secondary | ICD-10-CM | POA: Diagnosis not present

## 2013-05-28 DIAGNOSIS — E119 Type 2 diabetes mellitus without complications: Secondary | ICD-10-CM | POA: Diagnosis not present

## 2013-05-28 DIAGNOSIS — I890 Lymphedema, not elsewhere classified: Secondary | ICD-10-CM | POA: Diagnosis not present

## 2013-05-28 DIAGNOSIS — I10 Essential (primary) hypertension: Secondary | ICD-10-CM | POA: Diagnosis not present

## 2013-05-28 DIAGNOSIS — R269 Unspecified abnormalities of gait and mobility: Secondary | ICD-10-CM | POA: Diagnosis not present

## 2013-05-28 DIAGNOSIS — G8929 Other chronic pain: Secondary | ICD-10-CM | POA: Diagnosis not present

## 2013-05-29 DIAGNOSIS — C7951 Secondary malignant neoplasm of bone: Secondary | ICD-10-CM

## 2013-05-29 DIAGNOSIS — Z5111 Encounter for antineoplastic chemotherapy: Secondary | ICD-10-CM | POA: Diagnosis not present

## 2013-05-29 DIAGNOSIS — C7952 Secondary malignant neoplasm of bone marrow: Secondary | ICD-10-CM

## 2013-05-29 DIAGNOSIS — C50919 Malignant neoplasm of unspecified site of unspecified female breast: Secondary | ICD-10-CM | POA: Diagnosis not present

## 2013-06-03 DIAGNOSIS — M549 Dorsalgia, unspecified: Secondary | ICD-10-CM | POA: Diagnosis not present

## 2013-06-03 DIAGNOSIS — C7952 Secondary malignant neoplasm of bone marrow: Secondary | ICD-10-CM

## 2013-06-03 DIAGNOSIS — Z4789 Encounter for other orthopedic aftercare: Secondary | ICD-10-CM | POA: Diagnosis not present

## 2013-06-03 DIAGNOSIS — E871 Hypo-osmolality and hyponatremia: Secondary | ICD-10-CM | POA: Diagnosis not present

## 2013-06-03 DIAGNOSIS — R7309 Other abnormal glucose: Secondary | ICD-10-CM | POA: Diagnosis not present

## 2013-06-03 DIAGNOSIS — E119 Type 2 diabetes mellitus without complications: Secondary | ICD-10-CM | POA: Diagnosis not present

## 2013-06-03 DIAGNOSIS — C50919 Malignant neoplasm of unspecified site of unspecified female breast: Secondary | ICD-10-CM | POA: Diagnosis not present

## 2013-06-03 DIAGNOSIS — C7800 Secondary malignant neoplasm of unspecified lung: Secondary | ICD-10-CM | POA: Diagnosis not present

## 2013-06-03 DIAGNOSIS — R918 Other nonspecific abnormal finding of lung field: Secondary | ICD-10-CM | POA: Diagnosis not present

## 2013-06-03 DIAGNOSIS — I890 Lymphedema, not elsewhere classified: Secondary | ICD-10-CM | POA: Diagnosis not present

## 2013-06-03 DIAGNOSIS — G8929 Other chronic pain: Secondary | ICD-10-CM | POA: Diagnosis not present

## 2013-06-03 DIAGNOSIS — C801 Malignant (primary) neoplasm, unspecified: Secondary | ICD-10-CM | POA: Diagnosis not present

## 2013-06-03 DIAGNOSIS — C7951 Secondary malignant neoplasm of bone: Secondary | ICD-10-CM | POA: Diagnosis not present

## 2013-06-03 DIAGNOSIS — R269 Unspecified abnormalities of gait and mobility: Secondary | ICD-10-CM | POA: Diagnosis not present

## 2013-06-03 DIAGNOSIS — D649 Anemia, unspecified: Secondary | ICD-10-CM | POA: Diagnosis not present

## 2013-06-03 DIAGNOSIS — N949 Unspecified condition associated with female genital organs and menstrual cycle: Secondary | ICD-10-CM | POA: Diagnosis not present

## 2013-06-03 DIAGNOSIS — I10 Essential (primary) hypertension: Secondary | ICD-10-CM | POA: Diagnosis not present

## 2013-06-10 DIAGNOSIS — C50919 Malignant neoplasm of unspecified site of unspecified female breast: Secondary | ICD-10-CM | POA: Diagnosis not present

## 2013-06-10 DIAGNOSIS — Z5111 Encounter for antineoplastic chemotherapy: Secondary | ICD-10-CM

## 2013-06-11 DIAGNOSIS — E119 Type 2 diabetes mellitus without complications: Secondary | ICD-10-CM | POA: Diagnosis not present

## 2013-06-11 DIAGNOSIS — Z4789 Encounter for other orthopedic aftercare: Secondary | ICD-10-CM | POA: Diagnosis not present

## 2013-06-11 DIAGNOSIS — I10 Essential (primary) hypertension: Secondary | ICD-10-CM | POA: Diagnosis not present

## 2013-06-11 DIAGNOSIS — G8929 Other chronic pain: Secondary | ICD-10-CM | POA: Diagnosis not present

## 2013-06-11 DIAGNOSIS — I890 Lymphedema, not elsewhere classified: Secondary | ICD-10-CM | POA: Diagnosis not present

## 2013-06-11 DIAGNOSIS — R269 Unspecified abnormalities of gait and mobility: Secondary | ICD-10-CM | POA: Diagnosis not present

## 2013-06-17 DIAGNOSIS — M549 Dorsalgia, unspecified: Secondary | ICD-10-CM | POA: Diagnosis not present

## 2013-06-17 DIAGNOSIS — C801 Malignant (primary) neoplasm, unspecified: Secondary | ICD-10-CM | POA: Diagnosis not present

## 2013-06-17 DIAGNOSIS — D649 Anemia, unspecified: Secondary | ICD-10-CM | POA: Diagnosis not present

## 2013-06-17 DIAGNOSIS — N949 Unspecified condition associated with female genital organs and menstrual cycle: Secondary | ICD-10-CM | POA: Diagnosis not present

## 2013-06-17 DIAGNOSIS — E871 Hypo-osmolality and hyponatremia: Secondary | ICD-10-CM | POA: Diagnosis not present

## 2013-06-17 DIAGNOSIS — Z5111 Encounter for antineoplastic chemotherapy: Secondary | ICD-10-CM | POA: Diagnosis not present

## 2013-06-17 DIAGNOSIS — C50919 Malignant neoplasm of unspecified site of unspecified female breast: Secondary | ICD-10-CM | POA: Diagnosis not present

## 2013-06-24 DIAGNOSIS — E871 Hypo-osmolality and hyponatremia: Secondary | ICD-10-CM | POA: Diagnosis not present

## 2013-06-24 DIAGNOSIS — Z5111 Encounter for antineoplastic chemotherapy: Secondary | ICD-10-CM | POA: Diagnosis not present

## 2013-06-24 DIAGNOSIS — D649 Anemia, unspecified: Secondary | ICD-10-CM | POA: Diagnosis not present

## 2013-06-24 DIAGNOSIS — N949 Unspecified condition associated with female genital organs and menstrual cycle: Secondary | ICD-10-CM | POA: Diagnosis not present

## 2013-06-24 DIAGNOSIS — C7952 Secondary malignant neoplasm of bone marrow: Secondary | ICD-10-CM

## 2013-06-24 DIAGNOSIS — C7800 Secondary malignant neoplasm of unspecified lung: Secondary | ICD-10-CM

## 2013-06-24 DIAGNOSIS — M549 Dorsalgia, unspecified: Secondary | ICD-10-CM | POA: Diagnosis not present

## 2013-06-24 DIAGNOSIS — C50919 Malignant neoplasm of unspecified site of unspecified female breast: Secondary | ICD-10-CM | POA: Diagnosis not present

## 2013-06-24 DIAGNOSIS — C7951 Secondary malignant neoplasm of bone: Secondary | ICD-10-CM | POA: Diagnosis not present

## 2013-06-24 DIAGNOSIS — C801 Malignant (primary) neoplasm, unspecified: Secondary | ICD-10-CM | POA: Diagnosis not present

## 2013-06-26 DIAGNOSIS — R7881 Bacteremia: Secondary | ICD-10-CM | POA: Diagnosis not present

## 2013-06-26 DIAGNOSIS — C50919 Malignant neoplasm of unspecified site of unspecified female breast: Secondary | ICD-10-CM | POA: Diagnosis present

## 2013-06-26 DIAGNOSIS — K6812 Psoas muscle abscess: Secondary | ICD-10-CM | POA: Diagnosis not present

## 2013-06-26 DIAGNOSIS — M549 Dorsalgia, unspecified: Secondary | ICD-10-CM | POA: Diagnosis not present

## 2013-06-26 DIAGNOSIS — E119 Type 2 diabetes mellitus without complications: Secondary | ICD-10-CM | POA: Diagnosis present

## 2013-06-26 DIAGNOSIS — E785 Hyperlipidemia, unspecified: Secondary | ICD-10-CM | POA: Diagnosis present

## 2013-06-26 DIAGNOSIS — M545 Low back pain: Secondary | ICD-10-CM | POA: Diagnosis present

## 2013-06-26 DIAGNOSIS — R509 Fever, unspecified: Secondary | ICD-10-CM | POA: Diagnosis not present

## 2013-06-26 DIAGNOSIS — J90 Pleural effusion, not elsewhere classified: Secondary | ICD-10-CM | POA: Diagnosis not present

## 2013-06-26 DIAGNOSIS — T80218A Other infection due to central venous catheter, initial encounter: Secondary | ICD-10-CM | POA: Diagnosis not present

## 2013-06-26 DIAGNOSIS — Z79899 Other long term (current) drug therapy: Secondary | ICD-10-CM | POA: Diagnosis not present

## 2013-06-26 DIAGNOSIS — C7951 Secondary malignant neoplasm of bone: Secondary | ICD-10-CM | POA: Diagnosis not present

## 2013-06-26 DIAGNOSIS — I10 Essential (primary) hypertension: Secondary | ICD-10-CM | POA: Diagnosis not present

## 2013-06-26 DIAGNOSIS — A419 Sepsis, unspecified organism: Secondary | ICD-10-CM | POA: Diagnosis not present

## 2013-06-26 DIAGNOSIS — T82898A Other specified complication of vascular prosthetic devices, implants and grafts, initial encounter: Secondary | ICD-10-CM | POA: Diagnosis not present

## 2013-06-26 DIAGNOSIS — Z780 Asymptomatic menopausal state: Secondary | ICD-10-CM | POA: Diagnosis not present

## 2013-06-26 DIAGNOSIS — B951 Streptococcus, group B, as the cause of diseases classified elsewhere: Secondary | ICD-10-CM | POA: Diagnosis not present

## 2013-06-29 DIAGNOSIS — M549 Dorsalgia, unspecified: Secondary | ICD-10-CM

## 2013-06-29 DIAGNOSIS — R509 Fever, unspecified: Secondary | ICD-10-CM

## 2013-06-29 DIAGNOSIS — C7952 Secondary malignant neoplasm of bone marrow: Secondary | ICD-10-CM

## 2013-06-29 DIAGNOSIS — C7951 Secondary malignant neoplasm of bone: Secondary | ICD-10-CM | POA: Diagnosis not present

## 2013-06-29 DIAGNOSIS — C50919 Malignant neoplasm of unspecified site of unspecified female breast: Secondary | ICD-10-CM

## 2013-07-04 DIAGNOSIS — Z452 Encounter for adjustment and management of vascular access device: Secondary | ICD-10-CM | POA: Diagnosis not present

## 2013-07-04 DIAGNOSIS — K6812 Psoas muscle abscess: Secondary | ICD-10-CM | POA: Diagnosis not present

## 2013-07-04 DIAGNOSIS — E785 Hyperlipidemia, unspecified: Secondary | ICD-10-CM | POA: Diagnosis not present

## 2013-07-04 DIAGNOSIS — C50919 Malignant neoplasm of unspecified site of unspecified female breast: Secondary | ICD-10-CM | POA: Diagnosis not present

## 2013-07-04 DIAGNOSIS — Z5181 Encounter for therapeutic drug level monitoring: Secondary | ICD-10-CM | POA: Diagnosis not present

## 2013-07-04 DIAGNOSIS — I10 Essential (primary) hypertension: Secondary | ICD-10-CM | POA: Diagnosis not present

## 2013-07-04 DIAGNOSIS — C7951 Secondary malignant neoplasm of bone: Secondary | ICD-10-CM | POA: Diagnosis not present

## 2013-07-04 DIAGNOSIS — E119 Type 2 diabetes mellitus without complications: Secondary | ICD-10-CM | POA: Diagnosis not present

## 2013-07-06 DIAGNOSIS — K6812 Psoas muscle abscess: Secondary | ICD-10-CM | POA: Diagnosis not present

## 2013-07-06 DIAGNOSIS — E119 Type 2 diabetes mellitus without complications: Secondary | ICD-10-CM | POA: Diagnosis not present

## 2013-07-06 DIAGNOSIS — C50919 Malignant neoplasm of unspecified site of unspecified female breast: Secondary | ICD-10-CM | POA: Diagnosis not present

## 2013-07-06 DIAGNOSIS — I10 Essential (primary) hypertension: Secondary | ICD-10-CM | POA: Diagnosis not present

## 2013-07-06 DIAGNOSIS — C7951 Secondary malignant neoplasm of bone: Secondary | ICD-10-CM | POA: Diagnosis not present

## 2013-07-06 DIAGNOSIS — M629 Disorder of muscle, unspecified: Secondary | ICD-10-CM | POA: Diagnosis not present

## 2013-07-06 DIAGNOSIS — E785 Hyperlipidemia, unspecified: Secondary | ICD-10-CM | POA: Diagnosis not present

## 2013-07-08 DIAGNOSIS — Z9889 Other specified postprocedural states: Secondary | ICD-10-CM | POA: Diagnosis not present

## 2013-07-10 DIAGNOSIS — E119 Type 2 diabetes mellitus without complications: Secondary | ICD-10-CM | POA: Diagnosis not present

## 2013-07-10 DIAGNOSIS — K6812 Psoas muscle abscess: Secondary | ICD-10-CM | POA: Diagnosis not present

## 2013-07-10 DIAGNOSIS — I10 Essential (primary) hypertension: Secondary | ICD-10-CM | POA: Diagnosis not present

## 2013-07-10 DIAGNOSIS — C50919 Malignant neoplasm of unspecified site of unspecified female breast: Secondary | ICD-10-CM | POA: Diagnosis not present

## 2013-07-10 DIAGNOSIS — C7951 Secondary malignant neoplasm of bone: Secondary | ICD-10-CM | POA: Diagnosis not present

## 2013-07-10 DIAGNOSIS — E785 Hyperlipidemia, unspecified: Secondary | ICD-10-CM | POA: Diagnosis not present

## 2013-07-13 DIAGNOSIS — E785 Hyperlipidemia, unspecified: Secondary | ICD-10-CM | POA: Diagnosis not present

## 2013-07-13 DIAGNOSIS — C7951 Secondary malignant neoplasm of bone: Secondary | ICD-10-CM | POA: Diagnosis not present

## 2013-07-13 DIAGNOSIS — C50919 Malignant neoplasm of unspecified site of unspecified female breast: Secondary | ICD-10-CM | POA: Diagnosis not present

## 2013-07-13 DIAGNOSIS — K6812 Psoas muscle abscess: Secondary | ICD-10-CM | POA: Diagnosis not present

## 2013-07-13 DIAGNOSIS — E119 Type 2 diabetes mellitus without complications: Secondary | ICD-10-CM | POA: Diagnosis not present

## 2013-07-13 DIAGNOSIS — I10 Essential (primary) hypertension: Secondary | ICD-10-CM | POA: Diagnosis not present

## 2013-07-14 DIAGNOSIS — E785 Hyperlipidemia, unspecified: Secondary | ICD-10-CM | POA: Diagnosis not present

## 2013-07-14 DIAGNOSIS — E119 Type 2 diabetes mellitus without complications: Secondary | ICD-10-CM | POA: Diagnosis not present

## 2013-07-14 DIAGNOSIS — I10 Essential (primary) hypertension: Secondary | ICD-10-CM | POA: Diagnosis not present

## 2013-07-14 DIAGNOSIS — C7951 Secondary malignant neoplasm of bone: Secondary | ICD-10-CM | POA: Diagnosis not present

## 2013-07-14 DIAGNOSIS — K6812 Psoas muscle abscess: Secondary | ICD-10-CM | POA: Diagnosis not present

## 2013-07-14 DIAGNOSIS — C50919 Malignant neoplasm of unspecified site of unspecified female breast: Secondary | ICD-10-CM | POA: Diagnosis not present

## 2013-07-20 DIAGNOSIS — C50919 Malignant neoplasm of unspecified site of unspecified female breast: Secondary | ICD-10-CM | POA: Diagnosis not present

## 2013-07-20 DIAGNOSIS — E119 Type 2 diabetes mellitus without complications: Secondary | ICD-10-CM | POA: Diagnosis not present

## 2013-07-20 DIAGNOSIS — E785 Hyperlipidemia, unspecified: Secondary | ICD-10-CM | POA: Diagnosis not present

## 2013-07-20 DIAGNOSIS — I10 Essential (primary) hypertension: Secondary | ICD-10-CM | POA: Diagnosis not present

## 2013-07-20 DIAGNOSIS — K6812 Psoas muscle abscess: Secondary | ICD-10-CM | POA: Diagnosis not present

## 2013-07-20 DIAGNOSIS — C7951 Secondary malignant neoplasm of bone: Secondary | ICD-10-CM | POA: Diagnosis not present

## 2013-07-27 DIAGNOSIS — I10 Essential (primary) hypertension: Secondary | ICD-10-CM | POA: Diagnosis not present

## 2013-07-27 DIAGNOSIS — E119 Type 2 diabetes mellitus without complications: Secondary | ICD-10-CM | POA: Diagnosis not present

## 2013-07-27 DIAGNOSIS — C50919 Malignant neoplasm of unspecified site of unspecified female breast: Secondary | ICD-10-CM | POA: Diagnosis not present

## 2013-07-27 DIAGNOSIS — C7951 Secondary malignant neoplasm of bone: Secondary | ICD-10-CM | POA: Diagnosis not present

## 2013-07-27 DIAGNOSIS — E785 Hyperlipidemia, unspecified: Secondary | ICD-10-CM | POA: Diagnosis not present

## 2013-07-27 DIAGNOSIS — K6812 Psoas muscle abscess: Secondary | ICD-10-CM | POA: Diagnosis not present

## 2013-07-29 DIAGNOSIS — C50919 Malignant neoplasm of unspecified site of unspecified female breast: Secondary | ICD-10-CM

## 2013-07-29 DIAGNOSIS — C7951 Secondary malignant neoplasm of bone: Secondary | ICD-10-CM

## 2013-07-29 DIAGNOSIS — Z23 Encounter for immunization: Secondary | ICD-10-CM | POA: Diagnosis not present

## 2013-07-29 DIAGNOSIS — A419 Sepsis, unspecified organism: Secondary | ICD-10-CM | POA: Diagnosis not present

## 2013-07-29 DIAGNOSIS — C7952 Secondary malignant neoplasm of bone marrow: Secondary | ICD-10-CM

## 2013-07-31 DIAGNOSIS — I10 Essential (primary) hypertension: Secondary | ICD-10-CM | POA: Diagnosis not present

## 2013-08-03 DIAGNOSIS — K6812 Psoas muscle abscess: Secondary | ICD-10-CM | POA: Diagnosis not present

## 2013-08-03 DIAGNOSIS — C50919 Malignant neoplasm of unspecified site of unspecified female breast: Secondary | ICD-10-CM | POA: Diagnosis not present

## 2013-08-03 DIAGNOSIS — C7951 Secondary malignant neoplasm of bone: Secondary | ICD-10-CM | POA: Diagnosis not present

## 2013-08-03 DIAGNOSIS — E119 Type 2 diabetes mellitus without complications: Secondary | ICD-10-CM | POA: Diagnosis not present

## 2013-08-03 DIAGNOSIS — E785 Hyperlipidemia, unspecified: Secondary | ICD-10-CM | POA: Diagnosis not present

## 2013-08-03 DIAGNOSIS — I10 Essential (primary) hypertension: Secondary | ICD-10-CM | POA: Diagnosis not present

## 2013-08-11 DIAGNOSIS — C50919 Malignant neoplasm of unspecified site of unspecified female breast: Secondary | ICD-10-CM | POA: Diagnosis not present

## 2013-08-11 DIAGNOSIS — C801 Malignant (primary) neoplasm, unspecified: Secondary | ICD-10-CM | POA: Diagnosis not present

## 2013-08-12 DIAGNOSIS — C7952 Secondary malignant neoplasm of bone marrow: Secondary | ICD-10-CM

## 2013-08-12 DIAGNOSIS — Z5111 Encounter for antineoplastic chemotherapy: Secondary | ICD-10-CM

## 2013-08-12 DIAGNOSIS — C7951 Secondary malignant neoplasm of bone: Secondary | ICD-10-CM | POA: Diagnosis not present

## 2013-08-12 DIAGNOSIS — C50919 Malignant neoplasm of unspecified site of unspecified female breast: Secondary | ICD-10-CM

## 2013-08-19 DIAGNOSIS — C7951 Secondary malignant neoplasm of bone: Secondary | ICD-10-CM | POA: Diagnosis not present

## 2013-08-19 DIAGNOSIS — C50919 Malignant neoplasm of unspecified site of unspecified female breast: Secondary | ICD-10-CM

## 2013-08-19 DIAGNOSIS — C7952 Secondary malignant neoplasm of bone marrow: Secondary | ICD-10-CM

## 2013-08-19 DIAGNOSIS — Z5111 Encounter for antineoplastic chemotherapy: Secondary | ICD-10-CM

## 2013-08-26 DIAGNOSIS — C7951 Secondary malignant neoplasm of bone: Secondary | ICD-10-CM

## 2013-08-26 DIAGNOSIS — Z5111 Encounter for antineoplastic chemotherapy: Secondary | ICD-10-CM | POA: Diagnosis not present

## 2013-08-26 DIAGNOSIS — C50919 Malignant neoplasm of unspecified site of unspecified female breast: Secondary | ICD-10-CM | POA: Diagnosis not present

## 2013-08-26 DIAGNOSIS — C7952 Secondary malignant neoplasm of bone marrow: Secondary | ICD-10-CM

## 2013-08-28 DIAGNOSIS — E785 Hyperlipidemia, unspecified: Secondary | ICD-10-CM | POA: Diagnosis not present

## 2013-08-28 DIAGNOSIS — C50919 Malignant neoplasm of unspecified site of unspecified female breast: Secondary | ICD-10-CM | POA: Diagnosis not present

## 2013-08-28 DIAGNOSIS — K6812 Psoas muscle abscess: Secondary | ICD-10-CM | POA: Diagnosis not present

## 2013-08-28 DIAGNOSIS — E119 Type 2 diabetes mellitus without complications: Secondary | ICD-10-CM | POA: Diagnosis not present

## 2013-08-28 DIAGNOSIS — C7951 Secondary malignant neoplasm of bone: Secondary | ICD-10-CM | POA: Diagnosis not present

## 2013-08-28 DIAGNOSIS — I10 Essential (primary) hypertension: Secondary | ICD-10-CM | POA: Diagnosis not present

## 2013-08-31 DIAGNOSIS — C7952 Secondary malignant neoplasm of bone marrow: Secondary | ICD-10-CM

## 2013-08-31 DIAGNOSIS — C7951 Secondary malignant neoplasm of bone: Secondary | ICD-10-CM

## 2013-08-31 DIAGNOSIS — E119 Type 2 diabetes mellitus without complications: Secondary | ICD-10-CM

## 2013-08-31 DIAGNOSIS — C801 Malignant (primary) neoplasm, unspecified: Secondary | ICD-10-CM | POA: Diagnosis not present

## 2013-08-31 DIAGNOSIS — C50919 Malignant neoplasm of unspecified site of unspecified female breast: Secondary | ICD-10-CM

## 2013-08-31 DIAGNOSIS — M629 Disorder of muscle, unspecified: Secondary | ICD-10-CM | POA: Diagnosis not present

## 2013-08-31 DIAGNOSIS — Z79899 Other long term (current) drug therapy: Secondary | ICD-10-CM | POA: Diagnosis not present

## 2013-08-31 DIAGNOSIS — M549 Dorsalgia, unspecified: Secondary | ICD-10-CM | POA: Diagnosis not present

## 2013-08-31 DIAGNOSIS — E871 Hypo-osmolality and hyponatremia: Secondary | ICD-10-CM | POA: Diagnosis not present

## 2013-09-02 DIAGNOSIS — C7951 Secondary malignant neoplasm of bone: Secondary | ICD-10-CM

## 2013-09-02 DIAGNOSIS — C50919 Malignant neoplasm of unspecified site of unspecified female breast: Secondary | ICD-10-CM | POA: Diagnosis not present

## 2013-09-02 DIAGNOSIS — Z5111 Encounter for antineoplastic chemotherapy: Secondary | ICD-10-CM | POA: Diagnosis not present

## 2013-09-02 DIAGNOSIS — C7952 Secondary malignant neoplasm of bone marrow: Secondary | ICD-10-CM

## 2013-09-09 DIAGNOSIS — C801 Malignant (primary) neoplasm, unspecified: Secondary | ICD-10-CM | POA: Diagnosis not present

## 2013-09-09 DIAGNOSIS — Z5111 Encounter for antineoplastic chemotherapy: Secondary | ICD-10-CM

## 2013-09-09 DIAGNOSIS — M549 Dorsalgia, unspecified: Secondary | ICD-10-CM | POA: Diagnosis not present

## 2013-09-09 DIAGNOSIS — E871 Hypo-osmolality and hyponatremia: Secondary | ICD-10-CM | POA: Diagnosis not present

## 2013-09-09 DIAGNOSIS — Z79899 Other long term (current) drug therapy: Secondary | ICD-10-CM | POA: Diagnosis not present

## 2013-09-09 DIAGNOSIS — C50919 Malignant neoplasm of unspecified site of unspecified female breast: Secondary | ICD-10-CM

## 2013-09-09 DIAGNOSIS — C7952 Secondary malignant neoplasm of bone marrow: Secondary | ICD-10-CM

## 2013-09-09 DIAGNOSIS — C7951 Secondary malignant neoplasm of bone: Secondary | ICD-10-CM

## 2013-09-10 IMAGING — CT CT T SPINE W/O CM
4 series · 16 of 33 positions shown, 19 images · non-contrast
Comparison: MRI from today as well as prior MRI studies.

CLINICAL DATA: Metastatic breast cancer.  Fracture T12 with
surgical fusion.  Back pain

CT THORACIC SPINE WITHOUT CONTRAST
TECHNIQUE: Multidetector CT imaging of the thoracic spine was
performed without intravenous contrast administration. Multiplanar
CT image reconstructions were also generated

[Series 2: t spine bone · axial · 0.33mm/px · z∈[-178,+2]mm · 5 of 109 slices shown, 7 images]
[im 19/109  soft-tissue]
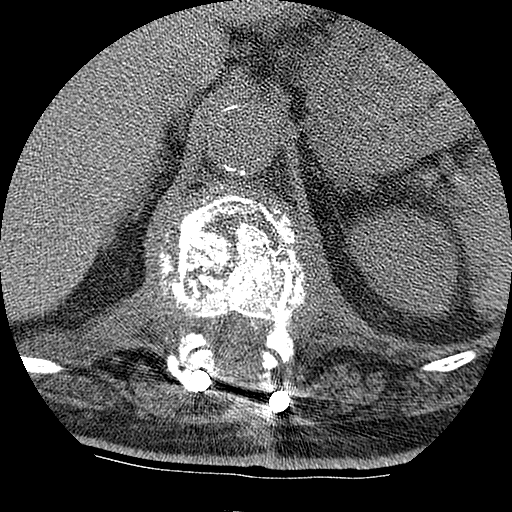
[im 19/109  bone]
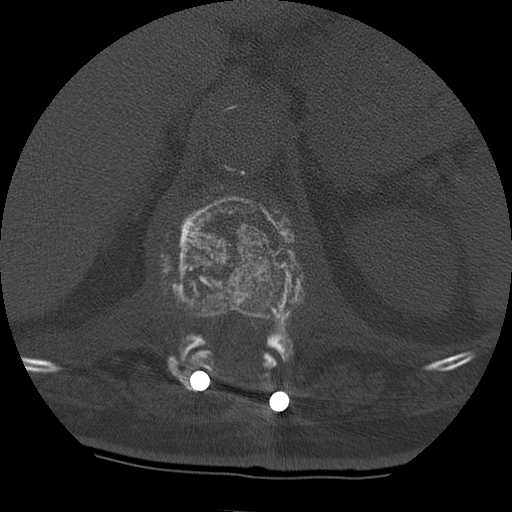
[im 37/109  bone]
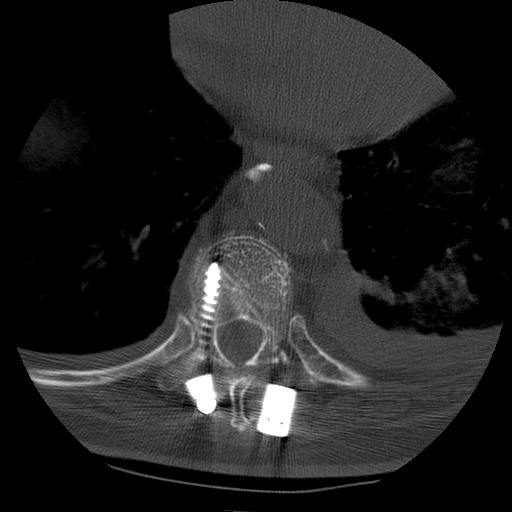
[im 55/109  bone]
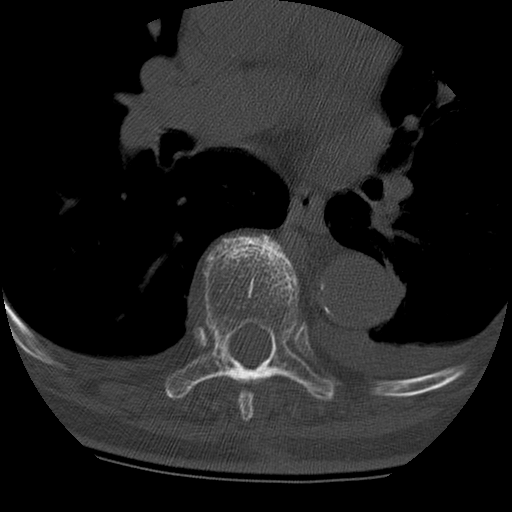
[im 73/109  bone]
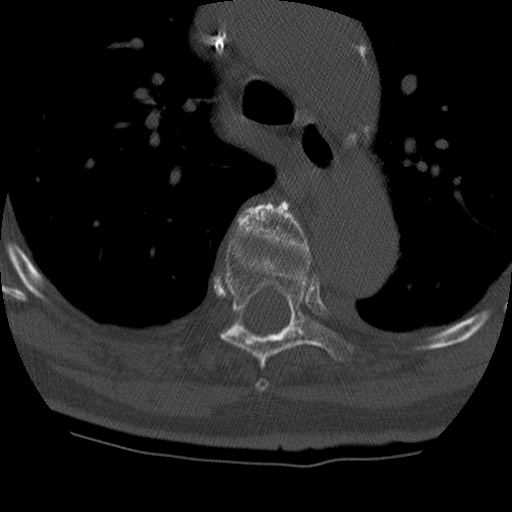
[im 91/109  soft-tissue]
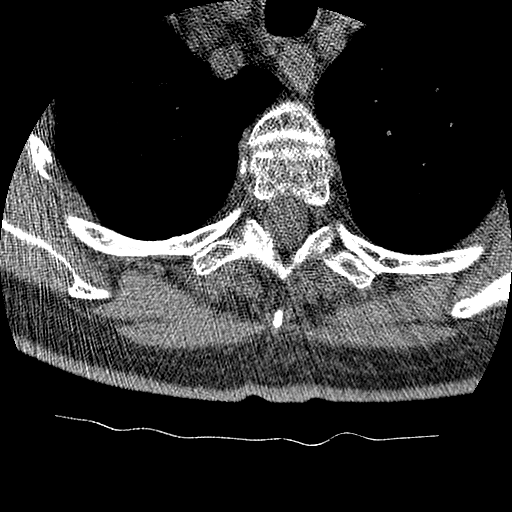
[im 91/109  bone]
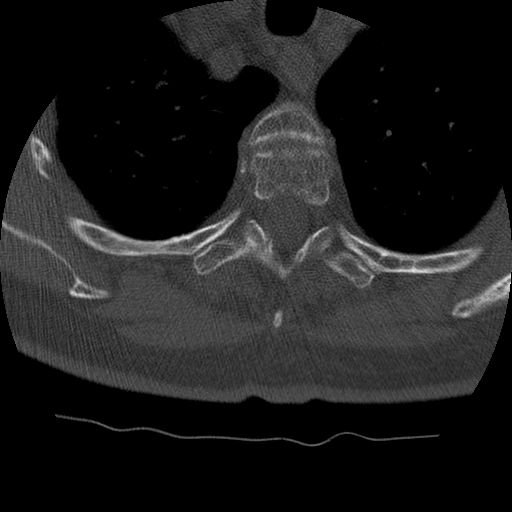

[Series 3: t spine soft · axial · 0.33mm/px · z∈[-178,-88]mm · 3 of 109 slices shown]
[im 19/109  soft-tissue]
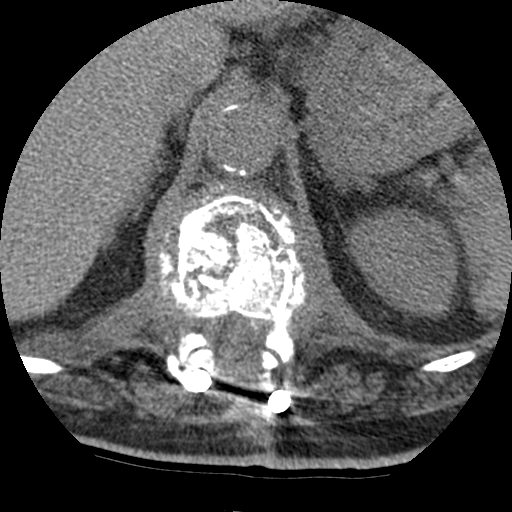
[im 37/109  soft-tissue]
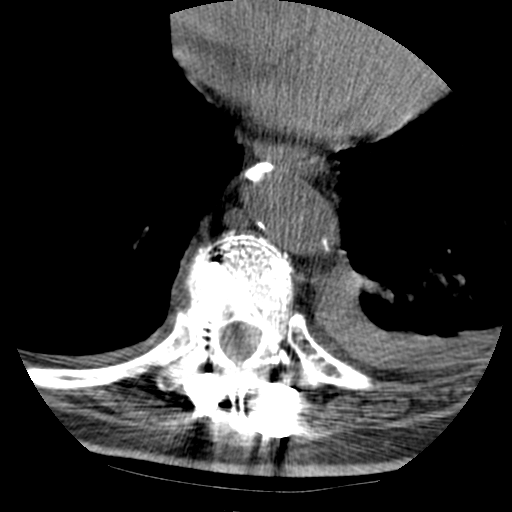
[im 55/109  soft-tissue]
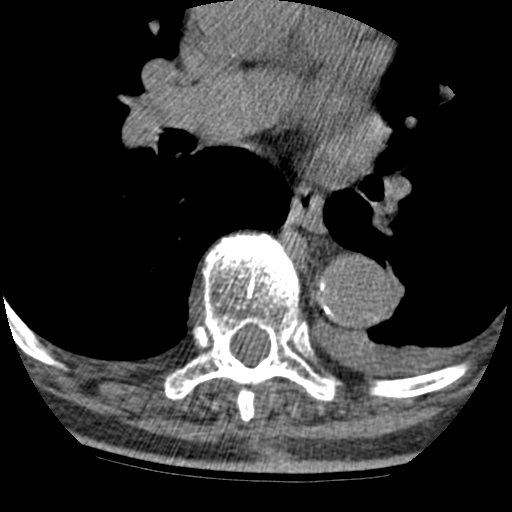

[Series 400: sagittal · sagittal · 0.54mm/px · 5 of 30 slices shown, 6 images]
[im 10/30  bone]
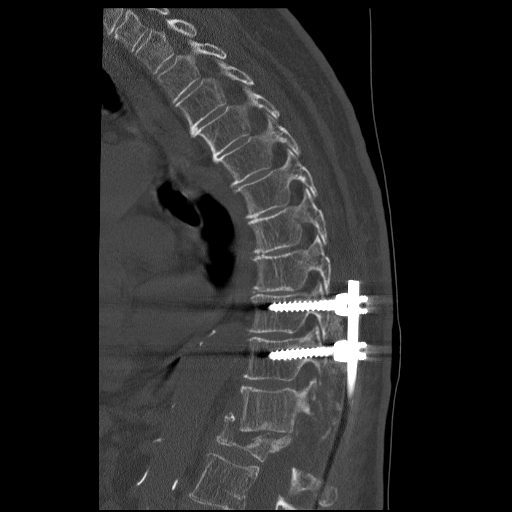
[im 13/30  bone]
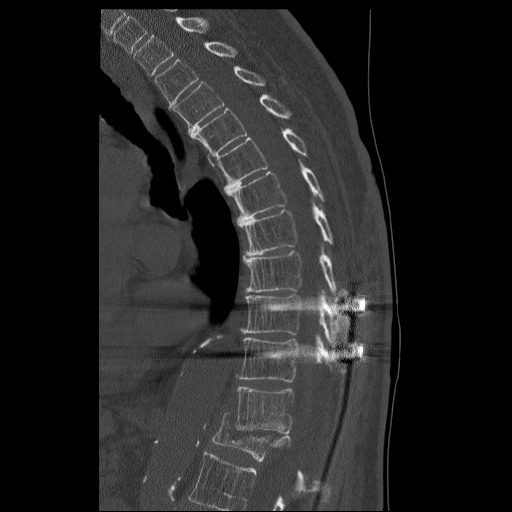
[im 15/30  soft-tissue]
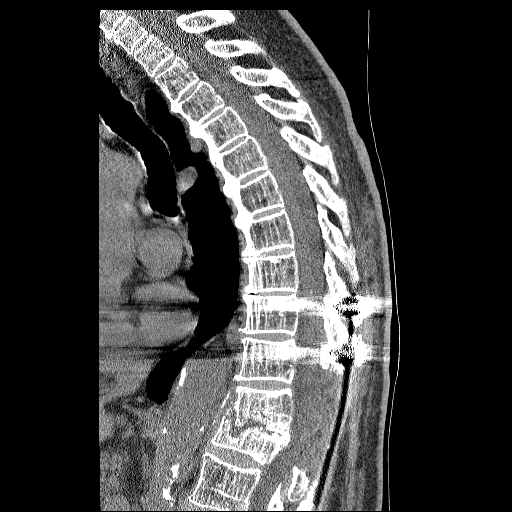
[im 15/30  bone]
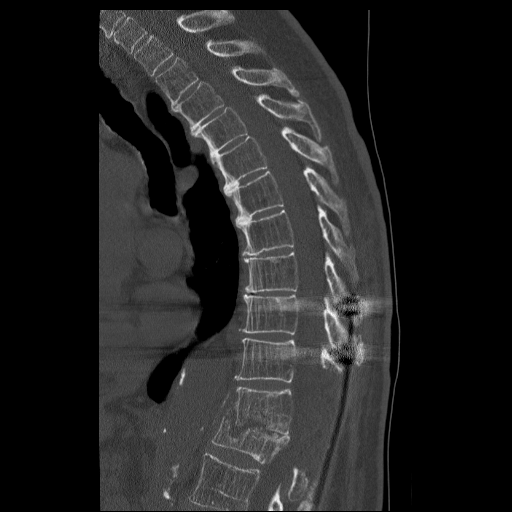
[im 17/30  bone]
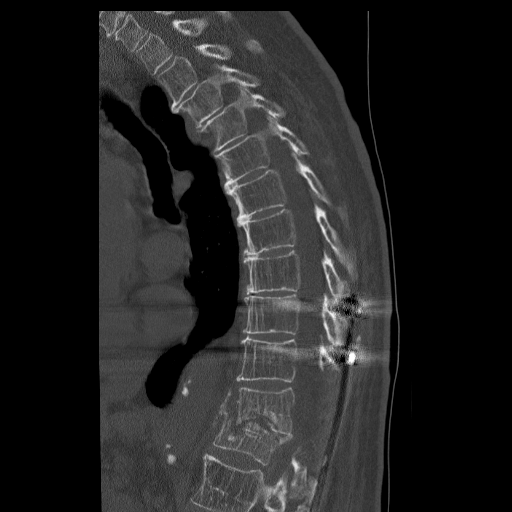
[im 20/30  bone]
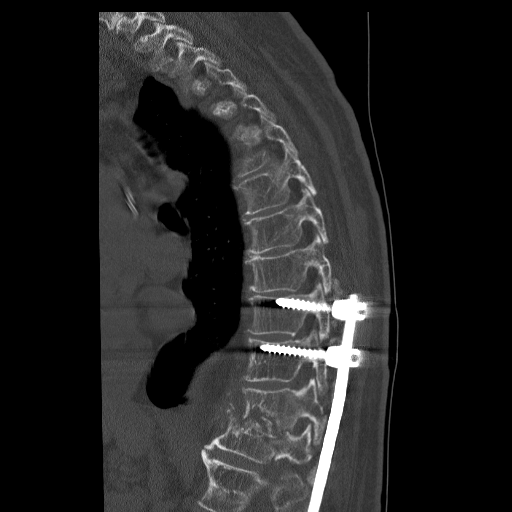

[Series 401: coronal · coronal · 0.54mm/px · 3 of 61 slices shown]
[im 13/61  bone]
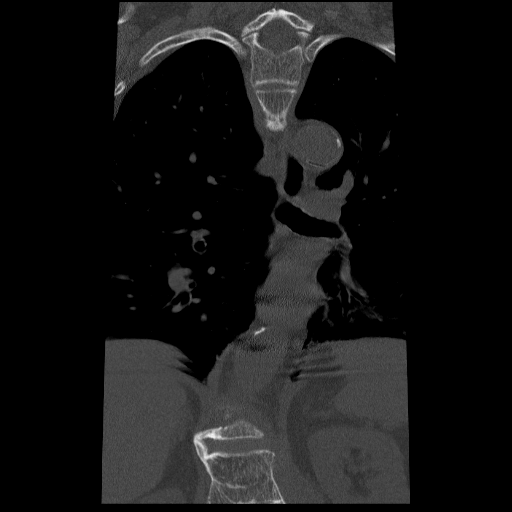
[im 25/61  bone]
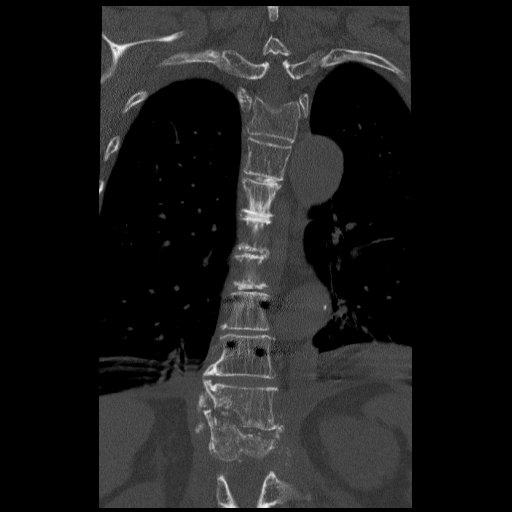
[im 37/61  bone]
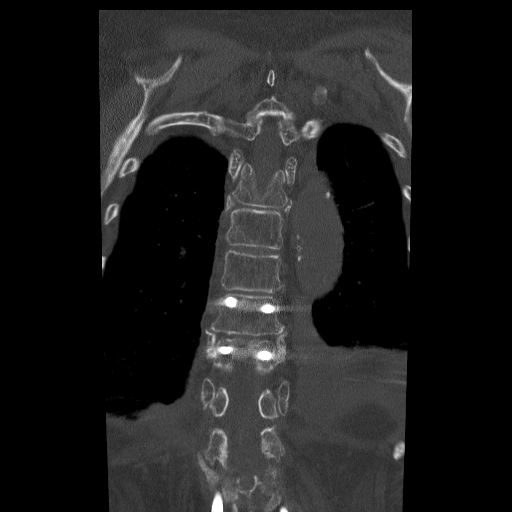

[16 of 33 positions shown; findings below may reference images not displayed]

FINDINGS: Pathologic fracture of T11 and T12 with endplate
destruction at T11-T12 disc space.  There are fractures to the
pedicles of T12 bilaterally.  There is 8 mm of posterior slip of
T11 relative to T12.  There is retropulsion of bone into the spinal
canal.  There is been prior surgical decompression of the canal.

Pedicle screw and posterior rod fusion T9-T10 extending down to L2.
Hardware is in satisfactory position.

No other fracture or mass lesion.
IMPRESSION: Pathologic fractures of T11 and T12 with endplate destruction.
This pattern is suggestive of infection.  Metastatic disease
considered less likely.  Pathologic fractures in the pedicles of
T12 bilaterally.

## 2013-09-17 DIAGNOSIS — C50919 Malignant neoplasm of unspecified site of unspecified female breast: Secondary | ICD-10-CM | POA: Diagnosis not present

## 2013-09-23 DIAGNOSIS — C50919 Malignant neoplasm of unspecified site of unspecified female breast: Secondary | ICD-10-CM | POA: Diagnosis not present

## 2013-09-30 DIAGNOSIS — C50919 Malignant neoplasm of unspecified site of unspecified female breast: Secondary | ICD-10-CM | POA: Diagnosis not present

## 2013-09-30 DIAGNOSIS — R630 Anorexia: Secondary | ICD-10-CM | POA: Diagnosis not present

## 2013-10-06 DIAGNOSIS — C50919 Malignant neoplasm of unspecified site of unspecified female breast: Secondary | ICD-10-CM | POA: Diagnosis not present

## 2013-10-06 DIAGNOSIS — R630 Anorexia: Secondary | ICD-10-CM | POA: Diagnosis not present

## 2013-10-06 DIAGNOSIS — J90 Pleural effusion, not elsewhere classified: Secondary | ICD-10-CM | POA: Diagnosis not present

## 2013-10-06 DIAGNOSIS — R918 Other nonspecific abnormal finding of lung field: Secondary | ICD-10-CM | POA: Diagnosis not present

## 2013-10-21 DIAGNOSIS — C50919 Malignant neoplasm of unspecified site of unspecified female breast: Secondary | ICD-10-CM | POA: Diagnosis not present

## 2013-10-21 DIAGNOSIS — C7951 Secondary malignant neoplasm of bone: Secondary | ICD-10-CM | POA: Diagnosis not present

## 2013-11-05 DIAGNOSIS — I10 Essential (primary) hypertension: Secondary | ICD-10-CM | POA: Diagnosis not present

## 2013-11-05 DIAGNOSIS — IMO0001 Reserved for inherently not codable concepts without codable children: Secondary | ICD-10-CM | POA: Diagnosis not present

## 2013-11-05 DIAGNOSIS — Z0000 Encounter for general adult medical examination without abnormal findings: Secondary | ICD-10-CM | POA: Diagnosis not present

## 2013-11-18 DIAGNOSIS — C7951 Secondary malignant neoplasm of bone: Secondary | ICD-10-CM | POA: Diagnosis not present

## 2013-11-18 DIAGNOSIS — C50919 Malignant neoplasm of unspecified site of unspecified female breast: Secondary | ICD-10-CM | POA: Diagnosis not present

## 2013-12-14 DIAGNOSIS — C7952 Secondary malignant neoplasm of bone marrow: Secondary | ICD-10-CM | POA: Diagnosis not present

## 2013-12-14 DIAGNOSIS — C7951 Secondary malignant neoplasm of bone: Secondary | ICD-10-CM | POA: Diagnosis not present

## 2013-12-14 DIAGNOSIS — R948 Abnormal results of function studies of other organs and systems: Secondary | ICD-10-CM | POA: Diagnosis not present

## 2013-12-14 DIAGNOSIS — C50919 Malignant neoplasm of unspecified site of unspecified female breast: Secondary | ICD-10-CM | POA: Diagnosis not present

## 2013-12-16 ENCOUNTER — Other Ambulatory Visit (HOSPITAL_COMMUNITY): Payer: Self-pay | Admitting: Internal Medicine

## 2013-12-16 DIAGNOSIS — C50919 Malignant neoplasm of unspecified site of unspecified female breast: Secondary | ICD-10-CM | POA: Diagnosis not present

## 2013-12-16 DIAGNOSIS — C7952 Secondary malignant neoplasm of bone marrow: Secondary | ICD-10-CM | POA: Diagnosis not present

## 2013-12-16 DIAGNOSIS — C7951 Secondary malignant neoplasm of bone: Secondary | ICD-10-CM | POA: Diagnosis not present

## 2013-12-28 ENCOUNTER — Ambulatory Visit (HOSPITAL_COMMUNITY): Payer: Medicare Other

## 2013-12-29 ENCOUNTER — Ambulatory Visit (HOSPITAL_COMMUNITY)
Admission: RE | Admit: 2013-12-29 | Discharge: 2013-12-29 | Disposition: A | Discharge status: Home / Self Care | Payer: Medicare Other | Source: Ambulatory Visit | Attending: Internal Medicine | Admitting: Internal Medicine

## 2013-12-29 DIAGNOSIS — J90 Pleural effusion, not elsewhere classified: Secondary | ICD-10-CM | POA: Diagnosis not present

## 2013-12-29 DIAGNOSIS — C50919 Malignant neoplasm of unspecified site of unspecified female breast: Secondary | ICD-10-CM | POA: Insufficient documentation

## 2013-12-29 DIAGNOSIS — C7951 Secondary malignant neoplasm of bone: Secondary | ICD-10-CM | POA: Diagnosis not present

## 2013-12-29 DIAGNOSIS — C7952 Secondary malignant neoplasm of bone marrow: Secondary | ICD-10-CM

## 2013-12-29 DIAGNOSIS — K8020 Calculus of gallbladder without cholecystitis without obstruction: Secondary | ICD-10-CM | POA: Diagnosis not present

## 2013-12-29 LAB — GLUCOSE, CAPILLARY: GLUCOSE-CAPILLARY: 100 mg/dL — AB (ref 70–99)

## 2013-12-29 MED ORDER — FLUDEOXYGLUCOSE F - 18 (FDG) INJECTION
6.1000 | Freq: Once | INTRAVENOUS | Status: AC | PRN
  Administered 2013-12-29: 6.1 via INTRAVENOUS

## 2014-01-08 DIAGNOSIS — C50919 Malignant neoplasm of unspecified site of unspecified female breast: Secondary | ICD-10-CM | POA: Diagnosis not present

## 2014-01-27 DIAGNOSIS — C7951 Secondary malignant neoplasm of bone: Secondary | ICD-10-CM | POA: Diagnosis not present

## 2014-01-27 DIAGNOSIS — Z9889 Other specified postprocedural states: Secondary | ICD-10-CM | POA: Diagnosis not present

## 2014-01-27 DIAGNOSIS — C7952 Secondary malignant neoplasm of bone marrow: Secondary | ICD-10-CM | POA: Diagnosis not present

## 2014-01-27 DIAGNOSIS — C50919 Malignant neoplasm of unspecified site of unspecified female breast: Secondary | ICD-10-CM | POA: Diagnosis not present

## 2014-02-05 DIAGNOSIS — I10 Essential (primary) hypertension: Secondary | ICD-10-CM | POA: Diagnosis not present

## 2014-03-10 DIAGNOSIS — C7951 Secondary malignant neoplasm of bone: Secondary | ICD-10-CM | POA: Diagnosis not present

## 2014-03-10 DIAGNOSIS — C7952 Secondary malignant neoplasm of bone marrow: Secondary | ICD-10-CM | POA: Diagnosis not present

## 2014-03-10 DIAGNOSIS — C50919 Malignant neoplasm of unspecified site of unspecified female breast: Secondary | ICD-10-CM | POA: Diagnosis not present

## 2014-04-19 DIAGNOSIS — C7951 Secondary malignant neoplasm of bone: Secondary | ICD-10-CM | POA: Diagnosis not present

## 2014-04-19 DIAGNOSIS — C7952 Secondary malignant neoplasm of bone marrow: Secondary | ICD-10-CM | POA: Diagnosis not present

## 2014-04-19 DIAGNOSIS — C50919 Malignant neoplasm of unspecified site of unspecified female breast: Secondary | ICD-10-CM | POA: Diagnosis not present

## 2014-04-22 DIAGNOSIS — C7952 Secondary malignant neoplasm of bone marrow: Secondary | ICD-10-CM | POA: Diagnosis not present

## 2014-04-22 DIAGNOSIS — C50919 Malignant neoplasm of unspecified site of unspecified female breast: Secondary | ICD-10-CM | POA: Diagnosis not present

## 2014-04-22 DIAGNOSIS — C7951 Secondary malignant neoplasm of bone: Secondary | ICD-10-CM | POA: Diagnosis not present

## 2014-04-22 DIAGNOSIS — Z9889 Other specified postprocedural states: Secondary | ICD-10-CM | POA: Diagnosis not present

## 2014-05-05 DIAGNOSIS — C7952 Secondary malignant neoplasm of bone marrow: Secondary | ICD-10-CM | POA: Diagnosis not present

## 2014-05-05 DIAGNOSIS — C7951 Secondary malignant neoplasm of bone: Secondary | ICD-10-CM | POA: Diagnosis not present

## 2014-05-05 DIAGNOSIS — C50919 Malignant neoplasm of unspecified site of unspecified female breast: Secondary | ICD-10-CM | POA: Diagnosis not present

## 2014-05-14 DIAGNOSIS — I10 Essential (primary) hypertension: Secondary | ICD-10-CM | POA: Diagnosis not present

## 2014-05-14 DIAGNOSIS — IMO0001 Reserved for inherently not codable concepts without codable children: Secondary | ICD-10-CM | POA: Diagnosis not present

## 2014-06-02 DIAGNOSIS — C7952 Secondary malignant neoplasm of bone marrow: Secondary | ICD-10-CM | POA: Diagnosis not present

## 2014-06-02 DIAGNOSIS — C50919 Malignant neoplasm of unspecified site of unspecified female breast: Secondary | ICD-10-CM | POA: Diagnosis not present

## 2014-06-02 DIAGNOSIS — C7951 Secondary malignant neoplasm of bone: Secondary | ICD-10-CM | POA: Diagnosis not present

## 2014-07-14 DIAGNOSIS — Z23 Encounter for immunization: Secondary | ICD-10-CM | POA: Diagnosis not present

## 2014-08-16 DIAGNOSIS — M549 Dorsalgia, unspecified: Secondary | ICD-10-CM | POA: Diagnosis not present

## 2014-08-16 DIAGNOSIS — G8929 Other chronic pain: Secondary | ICD-10-CM | POA: Diagnosis not present

## 2014-08-16 DIAGNOSIS — Z95828 Presence of other vascular implants and grafts: Secondary | ICD-10-CM | POA: Diagnosis not present

## 2014-08-16 DIAGNOSIS — D508 Other iron deficiency anemias: Secondary | ICD-10-CM | POA: Diagnosis not present

## 2014-08-16 DIAGNOSIS — C50919 Malignant neoplasm of unspecified site of unspecified female breast: Secondary | ICD-10-CM | POA: Diagnosis not present

## 2014-08-17 DIAGNOSIS — E1165 Type 2 diabetes mellitus with hyperglycemia: Secondary | ICD-10-CM | POA: Diagnosis not present

## 2014-08-17 DIAGNOSIS — I10 Essential (primary) hypertension: Secondary | ICD-10-CM | POA: Diagnosis not present

## 2014-08-19 DIAGNOSIS — C50911 Malignant neoplasm of unspecified site of right female breast: Secondary | ICD-10-CM | POA: Diagnosis not present

## 2014-08-19 DIAGNOSIS — M810 Age-related osteoporosis without current pathological fracture: Secondary | ICD-10-CM | POA: Diagnosis not present

## 2014-08-19 DIAGNOSIS — C50912 Malignant neoplasm of unspecified site of left female breast: Secondary | ICD-10-CM | POA: Diagnosis not present

## 2014-08-19 DIAGNOSIS — M549 Dorsalgia, unspecified: Secondary | ICD-10-CM | POA: Diagnosis not present

## 2014-09-27 DIAGNOSIS — C50912 Malignant neoplasm of unspecified site of left female breast: Secondary | ICD-10-CM | POA: Diagnosis not present

## 2014-09-27 DIAGNOSIS — C7951 Secondary malignant neoplasm of bone: Secondary | ICD-10-CM | POA: Diagnosis not present

## 2014-11-02 DIAGNOSIS — M810 Age-related osteoporosis without current pathological fracture: Secondary | ICD-10-CM | POA: Diagnosis not present

## 2014-11-02 DIAGNOSIS — C50911 Malignant neoplasm of unspecified site of right female breast: Secondary | ICD-10-CM | POA: Diagnosis not present

## 2014-11-02 DIAGNOSIS — C50912 Malignant neoplasm of unspecified site of left female breast: Secondary | ICD-10-CM | POA: Diagnosis not present

## 2014-11-19 DIAGNOSIS — Z0000 Encounter for general adult medical examination without abnormal findings: Secondary | ICD-10-CM | POA: Diagnosis not present

## 2014-11-19 DIAGNOSIS — Z1389 Encounter for screening for other disorder: Secondary | ICD-10-CM | POA: Diagnosis not present

## 2014-11-19 DIAGNOSIS — E1165 Type 2 diabetes mellitus with hyperglycemia: Secondary | ICD-10-CM | POA: Diagnosis not present

## 2014-11-19 DIAGNOSIS — I10 Essential (primary) hypertension: Secondary | ICD-10-CM | POA: Diagnosis not present

## 2014-12-14 DIAGNOSIS — C7951 Secondary malignant neoplasm of bone: Secondary | ICD-10-CM | POA: Diagnosis not present

## 2014-12-14 DIAGNOSIS — C50912 Malignant neoplasm of unspecified site of left female breast: Secondary | ICD-10-CM | POA: Diagnosis not present

## 2014-12-14 DIAGNOSIS — Z95828 Presence of other vascular implants and grafts: Secondary | ICD-10-CM | POA: Diagnosis not present

## 2014-12-21 DIAGNOSIS — E11319 Type 2 diabetes mellitus with unspecified diabetic retinopathy without macular edema: Secondary | ICD-10-CM | POA: Diagnosis not present

## 2015-01-25 DIAGNOSIS — C50912 Malignant neoplasm of unspecified site of left female breast: Secondary | ICD-10-CM | POA: Diagnosis not present

## 2015-01-25 DIAGNOSIS — C7951 Secondary malignant neoplasm of bone: Secondary | ICD-10-CM | POA: Diagnosis not present

## 2015-02-18 DIAGNOSIS — I10 Essential (primary) hypertension: Secondary | ICD-10-CM | POA: Diagnosis not present

## 2015-02-18 DIAGNOSIS — E1165 Type 2 diabetes mellitus with hyperglycemia: Secondary | ICD-10-CM | POA: Diagnosis not present

## 2015-03-07 DIAGNOSIS — C50919 Malignant neoplasm of unspecified site of unspecified female breast: Secondary | ICD-10-CM | POA: Diagnosis not present

## 2015-03-07 DIAGNOSIS — C7951 Secondary malignant neoplasm of bone: Secondary | ICD-10-CM | POA: Diagnosis not present

## 2015-05-04 ENCOUNTER — Other Ambulatory Visit (HOSPITAL_COMMUNITY): Payer: Self-pay | Admitting: Internal Medicine

## 2015-05-04 DIAGNOSIS — M810 Age-related osteoporosis without current pathological fracture: Secondary | ICD-10-CM | POA: Diagnosis not present

## 2015-05-04 DIAGNOSIS — M549 Dorsalgia, unspecified: Secondary | ICD-10-CM | POA: Diagnosis not present

## 2015-05-04 DIAGNOSIS — G8929 Other chronic pain: Secondary | ICD-10-CM | POA: Diagnosis not present

## 2015-05-04 DIAGNOSIS — M1990 Unspecified osteoarthritis, unspecified site: Secondary | ICD-10-CM | POA: Diagnosis not present

## 2015-05-04 DIAGNOSIS — C50911 Malignant neoplasm of unspecified site of right female breast: Secondary | ICD-10-CM

## 2015-05-04 DIAGNOSIS — C7951 Secondary malignant neoplasm of bone: Secondary | ICD-10-CM | POA: Diagnosis not present

## 2015-05-04 DIAGNOSIS — D508 Other iron deficiency anemias: Secondary | ICD-10-CM | POA: Diagnosis not present

## 2015-05-17 ENCOUNTER — Ambulatory Visit (HOSPITAL_COMMUNITY)
Admission: RE | Admit: 2015-05-17 | Discharge: 2015-05-17 | Disposition: A | Discharge status: Home / Self Care | Payer: Medicare Other | Source: Ambulatory Visit | Attending: Internal Medicine | Admitting: Internal Medicine

## 2015-05-17 DIAGNOSIS — Z9013 Acquired absence of bilateral breasts and nipples: Secondary | ICD-10-CM | POA: Insufficient documentation

## 2015-05-17 DIAGNOSIS — I2510 Atherosclerotic heart disease of native coronary artery without angina pectoris: Secondary | ICD-10-CM | POA: Diagnosis not present

## 2015-05-17 DIAGNOSIS — K5730 Diverticulosis of large intestine without perforation or abscess without bleeding: Secondary | ICD-10-CM | POA: Insufficient documentation

## 2015-05-17 DIAGNOSIS — Z853 Personal history of malignant neoplasm of breast: Secondary | ICD-10-CM | POA: Diagnosis not present

## 2015-05-17 DIAGNOSIS — C50911 Malignant neoplasm of unspecified site of right female breast: Secondary | ICD-10-CM | POA: Diagnosis not present

## 2015-05-17 DIAGNOSIS — K8020 Calculus of gallbladder without cholecystitis without obstruction: Secondary | ICD-10-CM | POA: Insufficient documentation

## 2015-05-17 DIAGNOSIS — I700 Atherosclerosis of aorta: Secondary | ICD-10-CM | POA: Diagnosis not present

## 2015-05-17 DIAGNOSIS — K4020 Bilateral inguinal hernia, without obstruction or gangrene, not specified as recurrent: Secondary | ICD-10-CM | POA: Diagnosis not present

## 2015-05-17 DIAGNOSIS — M899 Disorder of bone, unspecified: Secondary | ICD-10-CM | POA: Insufficient documentation

## 2015-05-17 LAB — GLUCOSE, CAPILLARY: GLUCOSE-CAPILLARY: 107 mg/dL — AB (ref 65–99)

## 2015-05-17 MED ORDER — FLUDEOXYGLUCOSE F - 18 (FDG) INJECTION
6.1000 | Freq: Once | INTRAVENOUS | Status: DC | PRN
  Administered 2015-05-17: 6.1 via INTRAVENOUS
  Filled 2015-05-17: qty 6.1

## 2015-05-20 DIAGNOSIS — E1165 Type 2 diabetes mellitus with hyperglycemia: Secondary | ICD-10-CM | POA: Diagnosis not present

## 2015-05-20 DIAGNOSIS — I10 Essential (primary) hypertension: Secondary | ICD-10-CM | POA: Diagnosis not present

## 2015-05-27 DIAGNOSIS — M810 Age-related osteoporosis without current pathological fracture: Secondary | ICD-10-CM | POA: Diagnosis not present

## 2015-05-27 DIAGNOSIS — M1990 Unspecified osteoarthritis, unspecified site: Secondary | ICD-10-CM | POA: Diagnosis not present

## 2015-05-27 DIAGNOSIS — C50919 Malignant neoplasm of unspecified site of unspecified female breast: Secondary | ICD-10-CM | POA: Diagnosis not present

## 2015-05-27 DIAGNOSIS — M549 Dorsalgia, unspecified: Secondary | ICD-10-CM | POA: Diagnosis not present

## 2015-05-27 DIAGNOSIS — G8929 Other chronic pain: Secondary | ICD-10-CM | POA: Diagnosis not present

## 2015-06-13 DIAGNOSIS — C50919 Malignant neoplasm of unspecified site of unspecified female breast: Secondary | ICD-10-CM | POA: Diagnosis not present

## 2015-06-13 DIAGNOSIS — C7951 Secondary malignant neoplasm of bone: Secondary | ICD-10-CM | POA: Diagnosis not present

## 2015-07-25 DIAGNOSIS — Z23 Encounter for immunization: Secondary | ICD-10-CM | POA: Diagnosis not present

## 2015-07-25 DIAGNOSIS — M810 Age-related osteoporosis without current pathological fracture: Secondary | ICD-10-CM | POA: Diagnosis not present

## 2015-07-25 DIAGNOSIS — C50919 Malignant neoplasm of unspecified site of unspecified female breast: Secondary | ICD-10-CM | POA: Diagnosis not present

## 2015-08-24 DIAGNOSIS — I10 Essential (primary) hypertension: Secondary | ICD-10-CM | POA: Diagnosis not present

## 2015-08-24 DIAGNOSIS — M62830 Muscle spasm of back: Secondary | ICD-10-CM | POA: Diagnosis not present

## 2015-08-24 DIAGNOSIS — E1165 Type 2 diabetes mellitus with hyperglycemia: Secondary | ICD-10-CM | POA: Diagnosis not present

## 2015-09-05 DIAGNOSIS — C7951 Secondary malignant neoplasm of bone: Secondary | ICD-10-CM | POA: Diagnosis not present

## 2015-09-05 DIAGNOSIS — C50919 Malignant neoplasm of unspecified site of unspecified female breast: Secondary | ICD-10-CM | POA: Diagnosis not present

## 2015-10-17 DIAGNOSIS — Z95828 Presence of other vascular implants and grafts: Secondary | ICD-10-CM | POA: Diagnosis not present

## 2015-11-04 DIAGNOSIS — G893 Neoplasm related pain (acute) (chronic): Secondary | ICD-10-CM | POA: Diagnosis not present

## 2015-11-04 DIAGNOSIS — D508 Other iron deficiency anemias: Secondary | ICD-10-CM | POA: Diagnosis not present

## 2015-11-04 DIAGNOSIS — C7951 Secondary malignant neoplasm of bone: Secondary | ICD-10-CM | POA: Diagnosis not present

## 2015-11-04 DIAGNOSIS — C50911 Malignant neoplasm of unspecified site of right female breast: Secondary | ICD-10-CM | POA: Diagnosis not present

## 2015-11-04 DIAGNOSIS — M810 Age-related osteoporosis without current pathological fracture: Secondary | ICD-10-CM | POA: Diagnosis not present

## 2015-11-10 DIAGNOSIS — E1165 Type 2 diabetes mellitus with hyperglycemia: Secondary | ICD-10-CM | POA: Diagnosis not present

## 2015-11-10 DIAGNOSIS — I10 Essential (primary) hypertension: Secondary | ICD-10-CM | POA: Diagnosis not present

## 2015-11-10 DIAGNOSIS — M62830 Muscle spasm of back: Secondary | ICD-10-CM | POA: Diagnosis not present

## 2015-11-16 DIAGNOSIS — E1165 Type 2 diabetes mellitus with hyperglycemia: Secondary | ICD-10-CM | POA: Diagnosis not present

## 2015-11-16 DIAGNOSIS — I10 Essential (primary) hypertension: Secondary | ICD-10-CM | POA: Diagnosis not present

## 2015-12-01 DIAGNOSIS — H1089 Other conjunctivitis: Secondary | ICD-10-CM | POA: Diagnosis not present

## 2015-12-01 DIAGNOSIS — M4804 Spinal stenosis, thoracic region: Secondary | ICD-10-CM | POA: Diagnosis not present

## 2015-12-01 DIAGNOSIS — I10 Essential (primary) hypertension: Secondary | ICD-10-CM | POA: Diagnosis not present

## 2015-12-01 DIAGNOSIS — E119 Type 2 diabetes mellitus without complications: Secondary | ICD-10-CM | POA: Diagnosis not present

## 2015-12-01 DIAGNOSIS — M545 Low back pain: Secondary | ICD-10-CM | POA: Diagnosis not present

## 2015-12-01 DIAGNOSIS — H109 Unspecified conjunctivitis: Secondary | ICD-10-CM | POA: Diagnosis not present

## 2015-12-01 DIAGNOSIS — W1839XA Other fall on same level, initial encounter: Secondary | ICD-10-CM | POA: Diagnosis not present

## 2015-12-01 DIAGNOSIS — Z9013 Acquired absence of bilateral breasts and nipples: Secondary | ICD-10-CM | POA: Diagnosis not present

## 2015-12-01 DIAGNOSIS — R079 Chest pain, unspecified: Secondary | ICD-10-CM | POA: Diagnosis not present

## 2015-12-01 DIAGNOSIS — S300XXA Contusion of lower back and pelvis, initial encounter: Secondary | ICD-10-CM | POA: Diagnosis not present

## 2015-12-01 DIAGNOSIS — M4807 Spinal stenosis, lumbosacral region: Secondary | ICD-10-CM | POA: Diagnosis not present

## 2015-12-01 DIAGNOSIS — Z780 Asymptomatic menopausal state: Secondary | ICD-10-CM | POA: Diagnosis not present

## 2015-12-01 DIAGNOSIS — R7889 Finding of other specified substances, not normally found in blood: Secondary | ICD-10-CM | POA: Diagnosis not present

## 2015-12-01 DIAGNOSIS — M4854XS Collapsed vertebra, not elsewhere classified, thoracic region, sequela of fracture: Secondary | ICD-10-CM | POA: Diagnosis not present

## 2015-12-01 DIAGNOSIS — R748 Abnormal levels of other serum enzymes: Secondary | ICD-10-CM | POA: Diagnosis not present

## 2015-12-01 DIAGNOSIS — E785 Hyperlipidemia, unspecified: Secondary | ICD-10-CM | POA: Diagnosis not present

## 2015-12-01 DIAGNOSIS — Z8249 Family history of ischemic heart disease and other diseases of the circulatory system: Secondary | ICD-10-CM | POA: Diagnosis not present

## 2015-12-01 DIAGNOSIS — Z79899 Other long term (current) drug therapy: Secondary | ICD-10-CM | POA: Diagnosis not present

## 2015-12-01 DIAGNOSIS — Z853 Personal history of malignant neoplasm of breast: Secondary | ICD-10-CM | POA: Diagnosis not present

## 2015-12-02 DIAGNOSIS — H1089 Other conjunctivitis: Secondary | ICD-10-CM | POA: Diagnosis not present

## 2015-12-02 DIAGNOSIS — S300XXA Contusion of lower back and pelvis, initial encounter: Secondary | ICD-10-CM | POA: Diagnosis not present

## 2015-12-02 DIAGNOSIS — E119 Type 2 diabetes mellitus without complications: Secondary | ICD-10-CM | POA: Diagnosis not present

## 2015-12-02 DIAGNOSIS — R748 Abnormal levels of other serum enzymes: Secondary | ICD-10-CM | POA: Diagnosis not present

## 2015-12-02 DIAGNOSIS — W1839XA Other fall on same level, initial encounter: Secondary | ICD-10-CM | POA: Diagnosis not present

## 2015-12-09 DIAGNOSIS — M62830 Muscle spasm of back: Secondary | ICD-10-CM | POA: Diagnosis not present

## 2015-12-09 DIAGNOSIS — M545 Low back pain: Secondary | ICD-10-CM | POA: Diagnosis not present

## 2015-12-20 DIAGNOSIS — Z95828 Presence of other vascular implants and grafts: Secondary | ICD-10-CM | POA: Diagnosis not present

## 2016-02-07 DIAGNOSIS — C7951 Secondary malignant neoplasm of bone: Secondary | ICD-10-CM | POA: Diagnosis not present

## 2016-02-07 DIAGNOSIS — C50919 Malignant neoplasm of unspecified site of unspecified female breast: Secondary | ICD-10-CM | POA: Diagnosis not present

## 2016-02-09 DIAGNOSIS — I10 Essential (primary) hypertension: Secondary | ICD-10-CM | POA: Diagnosis not present

## 2016-02-09 DIAGNOSIS — Z0000 Encounter for general adult medical examination without abnormal findings: Secondary | ICD-10-CM | POA: Diagnosis not present

## 2016-02-09 DIAGNOSIS — K210 Gastro-esophageal reflux disease with esophagitis: Secondary | ICD-10-CM | POA: Diagnosis not present

## 2016-02-09 DIAGNOSIS — E1142 Type 2 diabetes mellitus with diabetic polyneuropathy: Secondary | ICD-10-CM | POA: Diagnosis not present

## 2016-02-09 DIAGNOSIS — Z1389 Encounter for screening for other disorder: Secondary | ICD-10-CM | POA: Diagnosis not present

## 2016-03-23 DIAGNOSIS — Z95828 Presence of other vascular implants and grafts: Secondary | ICD-10-CM | POA: Diagnosis not present

## 2016-05-03 DIAGNOSIS — Z452 Encounter for adjustment and management of vascular access device: Secondary | ICD-10-CM | POA: Diagnosis not present

## 2016-05-15 DIAGNOSIS — E1142 Type 2 diabetes mellitus with diabetic polyneuropathy: Secondary | ICD-10-CM | POA: Diagnosis not present

## 2016-05-15 DIAGNOSIS — I10 Essential (primary) hypertension: Secondary | ICD-10-CM | POA: Diagnosis not present

## 2016-05-15 DIAGNOSIS — K210 Gastro-esophageal reflux disease with esophagitis: Secondary | ICD-10-CM | POA: Diagnosis not present

## 2016-06-14 DIAGNOSIS — Z452 Encounter for adjustment and management of vascular access device: Secondary | ICD-10-CM | POA: Diagnosis not present

## 2016-06-28 DIAGNOSIS — M85832 Other specified disorders of bone density and structure, left forearm: Secondary | ICD-10-CM | POA: Diagnosis not present

## 2016-06-28 DIAGNOSIS — Z780 Asymptomatic menopausal state: Secondary | ICD-10-CM | POA: Diagnosis not present

## 2016-06-28 DIAGNOSIS — C50919 Malignant neoplasm of unspecified site of unspecified female breast: Secondary | ICD-10-CM | POA: Diagnosis not present

## 2016-06-28 DIAGNOSIS — M85851 Other specified disorders of bone density and structure, right thigh: Secondary | ICD-10-CM | POA: Diagnosis not present

## 2016-06-28 DIAGNOSIS — Z7984 Long term (current) use of oral hypoglycemic drugs: Secondary | ICD-10-CM | POA: Diagnosis not present

## 2016-06-28 DIAGNOSIS — E7800 Pure hypercholesterolemia, unspecified: Secondary | ICD-10-CM | POA: Diagnosis not present

## 2016-06-28 DIAGNOSIS — Z79899 Other long term (current) drug therapy: Secondary | ICD-10-CM | POA: Diagnosis not present

## 2016-06-28 DIAGNOSIS — M810 Age-related osteoporosis without current pathological fracture: Secondary | ICD-10-CM | POA: Diagnosis not present

## 2016-06-28 DIAGNOSIS — I10 Essential (primary) hypertension: Secondary | ICD-10-CM | POA: Diagnosis not present

## 2016-06-28 DIAGNOSIS — E119 Type 2 diabetes mellitus without complications: Secondary | ICD-10-CM | POA: Diagnosis not present

## 2016-07-26 DIAGNOSIS — Z452 Encounter for adjustment and management of vascular access device: Secondary | ICD-10-CM | POA: Diagnosis not present

## 2016-08-16 DIAGNOSIS — I10 Essential (primary) hypertension: Secondary | ICD-10-CM | POA: Diagnosis not present

## 2016-08-16 DIAGNOSIS — E1142 Type 2 diabetes mellitus with diabetic polyneuropathy: Secondary | ICD-10-CM | POA: Diagnosis not present

## 2016-08-16 DIAGNOSIS — K210 Gastro-esophageal reflux disease with esophagitis: Secondary | ICD-10-CM | POA: Diagnosis not present

## 2016-08-21 DIAGNOSIS — Z23 Encounter for immunization: Secondary | ICD-10-CM | POA: Diagnosis not present

## 2016-09-04 DIAGNOSIS — Z452 Encounter for adjustment and management of vascular access device: Secondary | ICD-10-CM | POA: Diagnosis not present

## 2016-10-10 DIAGNOSIS — I10 Essential (primary) hypertension: Secondary | ICD-10-CM | POA: Diagnosis not present

## 2016-10-10 DIAGNOSIS — E1142 Type 2 diabetes mellitus with diabetic polyneuropathy: Secondary | ICD-10-CM | POA: Diagnosis not present

## 2016-10-10 DIAGNOSIS — K210 Gastro-esophageal reflux disease with esophagitis: Secondary | ICD-10-CM | POA: Diagnosis not present

## 2016-10-16 DIAGNOSIS — Z452 Encounter for adjustment and management of vascular access device: Secondary | ICD-10-CM | POA: Diagnosis not present

## 2016-11-13 DIAGNOSIS — E1142 Type 2 diabetes mellitus with diabetic polyneuropathy: Secondary | ICD-10-CM | POA: Diagnosis not present

## 2016-11-13 DIAGNOSIS — K210 Gastro-esophageal reflux disease with esophagitis: Secondary | ICD-10-CM | POA: Diagnosis not present

## 2016-11-13 DIAGNOSIS — N3000 Acute cystitis without hematuria: Secondary | ICD-10-CM | POA: Diagnosis not present

## 2016-11-13 DIAGNOSIS — I10 Essential (primary) hypertension: Secondary | ICD-10-CM | POA: Diagnosis not present

## 2016-11-19 DIAGNOSIS — E1142 Type 2 diabetes mellitus with diabetic polyneuropathy: Secondary | ICD-10-CM | POA: Diagnosis not present

## 2016-11-19 DIAGNOSIS — I10 Essential (primary) hypertension: Secondary | ICD-10-CM | POA: Diagnosis not present

## 2016-11-19 DIAGNOSIS — K210 Gastro-esophageal reflux disease with esophagitis: Secondary | ICD-10-CM | POA: Diagnosis not present

## 2016-11-21 DIAGNOSIS — I10 Essential (primary) hypertension: Secondary | ICD-10-CM | POA: Diagnosis not present

## 2016-11-21 DIAGNOSIS — E1142 Type 2 diabetes mellitus with diabetic polyneuropathy: Secondary | ICD-10-CM | POA: Diagnosis not present

## 2016-11-27 DIAGNOSIS — Z452 Encounter for adjustment and management of vascular access device: Secondary | ICD-10-CM | POA: Diagnosis not present

## 2016-12-17 DIAGNOSIS — K210 Gastro-esophageal reflux disease with esophagitis: Secondary | ICD-10-CM | POA: Diagnosis not present

## 2016-12-17 DIAGNOSIS — I10 Essential (primary) hypertension: Secondary | ICD-10-CM | POA: Diagnosis not present

## 2016-12-17 DIAGNOSIS — E1142 Type 2 diabetes mellitus with diabetic polyneuropathy: Secondary | ICD-10-CM | POA: Diagnosis not present

## 2017-01-14 DIAGNOSIS — Z452 Encounter for adjustment and management of vascular access device: Secondary | ICD-10-CM | POA: Diagnosis not present

## 2017-02-11 DIAGNOSIS — E1142 Type 2 diabetes mellitus with diabetic polyneuropathy: Secondary | ICD-10-CM | POA: Diagnosis not present

## 2017-02-11 DIAGNOSIS — I10 Essential (primary) hypertension: Secondary | ICD-10-CM | POA: Diagnosis not present

## 2017-02-11 DIAGNOSIS — K210 Gastro-esophageal reflux disease with esophagitis: Secondary | ICD-10-CM | POA: Diagnosis not present

## 2017-02-20 DIAGNOSIS — I10 Essential (primary) hypertension: Secondary | ICD-10-CM | POA: Diagnosis not present

## 2017-02-20 DIAGNOSIS — E119 Type 2 diabetes mellitus without complications: Secondary | ICD-10-CM | POA: Diagnosis not present

## 2017-02-26 DIAGNOSIS — Z452 Encounter for adjustment and management of vascular access device: Secondary | ICD-10-CM | POA: Diagnosis not present

## 2017-04-05 DIAGNOSIS — I10 Essential (primary) hypertension: Secondary | ICD-10-CM | POA: Diagnosis not present

## 2017-04-05 DIAGNOSIS — Z7984 Long term (current) use of oral hypoglycemic drugs: Secondary | ICD-10-CM | POA: Diagnosis not present

## 2017-04-05 DIAGNOSIS — H43393 Other vitreous opacities, bilateral: Secondary | ICD-10-CM | POA: Diagnosis not present

## 2017-04-05 DIAGNOSIS — H524 Presbyopia: Secondary | ICD-10-CM | POA: Diagnosis not present

## 2017-04-05 DIAGNOSIS — H26491 Other secondary cataract, right eye: Secondary | ICD-10-CM | POA: Diagnosis not present

## 2017-04-05 DIAGNOSIS — H35033 Hypertensive retinopathy, bilateral: Secondary | ICD-10-CM | POA: Diagnosis not present

## 2017-04-05 DIAGNOSIS — E119 Type 2 diabetes mellitus without complications: Secondary | ICD-10-CM | POA: Diagnosis not present

## 2017-04-05 DIAGNOSIS — H5310 Unspecified subjective visual disturbances: Secondary | ICD-10-CM | POA: Diagnosis not present

## 2017-04-05 DIAGNOSIS — Z961 Presence of intraocular lens: Secondary | ICD-10-CM | POA: Diagnosis not present

## 2017-04-05 DIAGNOSIS — Z9842 Cataract extraction status, left eye: Secondary | ICD-10-CM | POA: Diagnosis not present

## 2017-04-05 DIAGNOSIS — H43813 Vitreous degeneration, bilateral: Secondary | ICD-10-CM | POA: Diagnosis not present

## 2017-04-09 DIAGNOSIS — Z452 Encounter for adjustment and management of vascular access device: Secondary | ICD-10-CM | POA: Diagnosis not present

## 2017-04-19 DIAGNOSIS — E119 Type 2 diabetes mellitus without complications: Secondary | ICD-10-CM | POA: Diagnosis not present

## 2017-04-19 DIAGNOSIS — I10 Essential (primary) hypertension: Secondary | ICD-10-CM | POA: Diagnosis not present

## 2017-05-14 DIAGNOSIS — I10 Essential (primary) hypertension: Secondary | ICD-10-CM | POA: Diagnosis not present

## 2017-05-14 DIAGNOSIS — E119 Type 2 diabetes mellitus without complications: Secondary | ICD-10-CM | POA: Diagnosis not present

## 2017-05-16 DIAGNOSIS — R5383 Other fatigue: Secondary | ICD-10-CM | POA: Diagnosis not present

## 2017-05-16 DIAGNOSIS — E1142 Type 2 diabetes mellitus with diabetic polyneuropathy: Secondary | ICD-10-CM | POA: Diagnosis not present

## 2017-05-16 DIAGNOSIS — K210 Gastro-esophageal reflux disease with esophagitis: Secondary | ICD-10-CM | POA: Diagnosis not present

## 2017-05-16 DIAGNOSIS — I10 Essential (primary) hypertension: Secondary | ICD-10-CM | POA: Diagnosis not present

## 2017-05-21 DIAGNOSIS — Z452 Encounter for adjustment and management of vascular access device: Secondary | ICD-10-CM | POA: Diagnosis not present

## 2017-07-02 DIAGNOSIS — Z452 Encounter for adjustment and management of vascular access device: Secondary | ICD-10-CM | POA: Diagnosis not present

## 2017-07-26 DIAGNOSIS — Z23 Encounter for immunization: Secondary | ICD-10-CM | POA: Diagnosis not present

## 2017-08-06 DIAGNOSIS — K210 Gastro-esophageal reflux disease with esophagitis: Secondary | ICD-10-CM | POA: Diagnosis not present

## 2017-08-06 DIAGNOSIS — I10 Essential (primary) hypertension: Secondary | ICD-10-CM | POA: Diagnosis not present

## 2017-08-06 DIAGNOSIS — E1142 Type 2 diabetes mellitus with diabetic polyneuropathy: Secondary | ICD-10-CM | POA: Diagnosis not present

## 2017-08-19 DIAGNOSIS — K210 Gastro-esophageal reflux disease with esophagitis: Secondary | ICD-10-CM | POA: Diagnosis not present

## 2017-08-19 DIAGNOSIS — Z452 Encounter for adjustment and management of vascular access device: Secondary | ICD-10-CM | POA: Diagnosis not present

## 2017-08-19 DIAGNOSIS — Z0000 Encounter for general adult medical examination without abnormal findings: Secondary | ICD-10-CM | POA: Diagnosis not present

## 2017-08-19 DIAGNOSIS — Z1389 Encounter for screening for other disorder: Secondary | ICD-10-CM | POA: Diagnosis not present

## 2017-08-19 DIAGNOSIS — E1142 Type 2 diabetes mellitus with diabetic polyneuropathy: Secondary | ICD-10-CM | POA: Diagnosis not present

## 2017-08-19 DIAGNOSIS — I10 Essential (primary) hypertension: Secondary | ICD-10-CM | POA: Diagnosis not present

## 2017-09-04 DIAGNOSIS — Z1389 Encounter for screening for other disorder: Secondary | ICD-10-CM | POA: Diagnosis not present

## 2017-09-04 DIAGNOSIS — K210 Gastro-esophageal reflux disease with esophagitis: Secondary | ICD-10-CM | POA: Diagnosis not present

## 2017-09-04 DIAGNOSIS — I10 Essential (primary) hypertension: Secondary | ICD-10-CM | POA: Diagnosis not present

## 2017-09-04 DIAGNOSIS — Z0000 Encounter for general adult medical examination without abnormal findings: Secondary | ICD-10-CM | POA: Diagnosis not present

## 2017-09-04 DIAGNOSIS — E1142 Type 2 diabetes mellitus with diabetic polyneuropathy: Secondary | ICD-10-CM | POA: Diagnosis not present

## 2017-09-30 DIAGNOSIS — Z452 Encounter for adjustment and management of vascular access device: Secondary | ICD-10-CM | POA: Diagnosis not present

## 2017-10-03 DIAGNOSIS — E1142 Type 2 diabetes mellitus with diabetic polyneuropathy: Secondary | ICD-10-CM | POA: Diagnosis not present

## 2017-10-03 DIAGNOSIS — K210 Gastro-esophageal reflux disease with esophagitis: Secondary | ICD-10-CM | POA: Diagnosis not present

## 2017-10-03 DIAGNOSIS — I10 Essential (primary) hypertension: Secondary | ICD-10-CM | POA: Diagnosis not present

## 2017-11-05 DIAGNOSIS — E1142 Type 2 diabetes mellitus with diabetic polyneuropathy: Secondary | ICD-10-CM | POA: Diagnosis not present

## 2017-11-05 DIAGNOSIS — I10 Essential (primary) hypertension: Secondary | ICD-10-CM | POA: Diagnosis not present

## 2017-11-05 DIAGNOSIS — K210 Gastro-esophageal reflux disease with esophagitis: Secondary | ICD-10-CM | POA: Diagnosis not present

## 2017-11-11 DIAGNOSIS — Z452 Encounter for adjustment and management of vascular access device: Secondary | ICD-10-CM | POA: Diagnosis not present

## 2017-11-26 DIAGNOSIS — I10 Essential (primary) hypertension: Secondary | ICD-10-CM | POA: Diagnosis not present

## 2017-11-26 DIAGNOSIS — K210 Gastro-esophageal reflux disease with esophagitis: Secondary | ICD-10-CM | POA: Diagnosis not present

## 2017-11-26 DIAGNOSIS — E1142 Type 2 diabetes mellitus with diabetic polyneuropathy: Secondary | ICD-10-CM | POA: Diagnosis not present

## 2017-12-23 DIAGNOSIS — Z452 Encounter for adjustment and management of vascular access device: Secondary | ICD-10-CM | POA: Diagnosis not present

## 2018-02-03 DIAGNOSIS — Z452 Encounter for adjustment and management of vascular access device: Secondary | ICD-10-CM | POA: Diagnosis not present

## 2018-02-26 DIAGNOSIS — I10 Essential (primary) hypertension: Secondary | ICD-10-CM | POA: Diagnosis not present

## 2018-02-26 DIAGNOSIS — E1142 Type 2 diabetes mellitus with diabetic polyneuropathy: Secondary | ICD-10-CM | POA: Diagnosis not present

## 2018-02-26 DIAGNOSIS — K210 Gastro-esophageal reflux disease with esophagitis: Secondary | ICD-10-CM | POA: Diagnosis not present

## 2018-03-03 DIAGNOSIS — E1142 Type 2 diabetes mellitus with diabetic polyneuropathy: Secondary | ICD-10-CM | POA: Diagnosis not present

## 2018-03-03 DIAGNOSIS — K210 Gastro-esophageal reflux disease with esophagitis: Secondary | ICD-10-CM | POA: Diagnosis not present

## 2018-03-03 DIAGNOSIS — I10 Essential (primary) hypertension: Secondary | ICD-10-CM | POA: Diagnosis not present

## 2018-03-03 DIAGNOSIS — R590 Localized enlarged lymph nodes: Secondary | ICD-10-CM | POA: Diagnosis not present

## 2018-03-20 DIAGNOSIS — Z452 Encounter for adjustment and management of vascular access device: Secondary | ICD-10-CM | POA: Diagnosis not present

## 2018-03-20 DIAGNOSIS — I10 Essential (primary) hypertension: Secondary | ICD-10-CM | POA: Diagnosis not present

## 2018-03-20 DIAGNOSIS — C50911 Malignant neoplasm of unspecified site of right female breast: Secondary | ICD-10-CM | POA: Diagnosis not present

## 2018-03-20 DIAGNOSIS — E1142 Type 2 diabetes mellitus with diabetic polyneuropathy: Secondary | ICD-10-CM | POA: Diagnosis not present

## 2018-03-20 DIAGNOSIS — C50912 Malignant neoplasm of unspecified site of left female breast: Secondary | ICD-10-CM | POA: Diagnosis not present

## 2018-03-20 DIAGNOSIS — R590 Localized enlarged lymph nodes: Secondary | ICD-10-CM | POA: Diagnosis not present

## 2018-03-20 DIAGNOSIS — Z170 Estrogen receptor positive status [ER+]: Secondary | ICD-10-CM | POA: Diagnosis not present

## 2018-03-20 DIAGNOSIS — M810 Age-related osteoporosis without current pathological fracture: Secondary | ICD-10-CM | POA: Diagnosis not present

## 2018-04-09 DIAGNOSIS — R229 Localized swelling, mass and lump, unspecified: Secondary | ICD-10-CM | POA: Diagnosis not present

## 2018-04-09 DIAGNOSIS — Z170 Estrogen receptor positive status [ER+]: Secondary | ICD-10-CM | POA: Diagnosis not present

## 2018-04-09 DIAGNOSIS — C50912 Malignant neoplasm of unspecified site of left female breast: Secondary | ICD-10-CM | POA: Diagnosis not present

## 2018-04-09 DIAGNOSIS — N6320 Unspecified lump in the left breast, unspecified quadrant: Secondary | ICD-10-CM | POA: Diagnosis not present

## 2018-04-09 DIAGNOSIS — R590 Localized enlarged lymph nodes: Secondary | ICD-10-CM | POA: Diagnosis not present

## 2018-04-09 DIAGNOSIS — C50911 Malignant neoplasm of unspecified site of right female breast: Secondary | ICD-10-CM | POA: Diagnosis not present

## 2018-04-21 DIAGNOSIS — C50912 Malignant neoplasm of unspecified site of left female breast: Secondary | ICD-10-CM | POA: Diagnosis not present

## 2018-04-21 DIAGNOSIS — Z170 Estrogen receptor positive status [ER+]: Secondary | ICD-10-CM | POA: Diagnosis not present

## 2018-04-21 DIAGNOSIS — C50911 Malignant neoplasm of unspecified site of right female breast: Secondary | ICD-10-CM | POA: Diagnosis not present

## 2018-04-21 DIAGNOSIS — C7951 Secondary malignant neoplasm of bone: Secondary | ICD-10-CM | POA: Diagnosis not present

## 2018-04-21 DIAGNOSIS — R590 Localized enlarged lymph nodes: Secondary | ICD-10-CM | POA: Diagnosis not present

## 2018-04-21 DIAGNOSIS — M810 Age-related osteoporosis without current pathological fracture: Secondary | ICD-10-CM | POA: Diagnosis not present

## 2018-04-29 DIAGNOSIS — E1142 Type 2 diabetes mellitus with diabetic polyneuropathy: Secondary | ICD-10-CM | POA: Diagnosis not present

## 2018-04-29 DIAGNOSIS — I10 Essential (primary) hypertension: Secondary | ICD-10-CM | POA: Diagnosis not present

## 2018-04-29 DIAGNOSIS — K210 Gastro-esophageal reflux disease with esophagitis: Secondary | ICD-10-CM | POA: Diagnosis not present

## 2018-05-01 DIAGNOSIS — Z452 Encounter for adjustment and management of vascular access device: Secondary | ICD-10-CM | POA: Diagnosis not present

## 2018-05-22 DIAGNOSIS — R590 Localized enlarged lymph nodes: Secondary | ICD-10-CM | POA: Diagnosis not present

## 2018-05-22 DIAGNOSIS — C50912 Malignant neoplasm of unspecified site of left female breast: Secondary | ICD-10-CM | POA: Diagnosis not present

## 2018-05-22 DIAGNOSIS — C7951 Secondary malignant neoplasm of bone: Secondary | ICD-10-CM | POA: Diagnosis not present

## 2018-05-22 DIAGNOSIS — Z79899 Other long term (current) drug therapy: Secondary | ICD-10-CM | POA: Diagnosis not present

## 2018-05-22 DIAGNOSIS — Z95828 Presence of other vascular implants and grafts: Secondary | ICD-10-CM | POA: Diagnosis not present

## 2018-05-22 DIAGNOSIS — Z79811 Long term (current) use of aromatase inhibitors: Secondary | ICD-10-CM | POA: Diagnosis not present

## 2018-05-22 DIAGNOSIS — M546 Pain in thoracic spine: Secondary | ICD-10-CM | POA: Diagnosis not present

## 2018-05-22 DIAGNOSIS — Z9849 Cataract extraction status, unspecified eye: Secondary | ICD-10-CM | POA: Diagnosis not present

## 2018-05-22 DIAGNOSIS — R591 Generalized enlarged lymph nodes: Secondary | ICD-10-CM | POA: Diagnosis not present

## 2018-05-22 DIAGNOSIS — M810 Age-related osteoporosis without current pathological fracture: Secondary | ICD-10-CM | POA: Diagnosis not present

## 2018-05-22 DIAGNOSIS — C50911 Malignant neoplasm of unspecified site of right female breast: Secondary | ICD-10-CM | POA: Diagnosis not present

## 2018-05-22 DIAGNOSIS — I10 Essential (primary) hypertension: Secondary | ICD-10-CM | POA: Diagnosis not present

## 2018-05-22 DIAGNOSIS — E7800 Pure hypercholesterolemia, unspecified: Secondary | ICD-10-CM | POA: Diagnosis not present

## 2018-05-22 DIAGNOSIS — E1136 Type 2 diabetes mellitus with diabetic cataract: Secondary | ICD-10-CM | POA: Diagnosis not present

## 2018-05-22 DIAGNOSIS — Z7984 Long term (current) use of oral hypoglycemic drugs: Secondary | ICD-10-CM | POA: Diagnosis not present

## 2018-05-22 DIAGNOSIS — Z9013 Acquired absence of bilateral breasts and nipples: Secondary | ICD-10-CM | POA: Diagnosis not present

## 2018-05-22 DIAGNOSIS — Z170 Estrogen receptor positive status [ER+]: Secondary | ICD-10-CM | POA: Diagnosis not present

## 2018-06-10 DIAGNOSIS — K210 Gastro-esophageal reflux disease with esophagitis: Secondary | ICD-10-CM | POA: Diagnosis not present

## 2018-06-10 DIAGNOSIS — E1142 Type 2 diabetes mellitus with diabetic polyneuropathy: Secondary | ICD-10-CM | POA: Diagnosis not present

## 2018-06-10 DIAGNOSIS — I10 Essential (primary) hypertension: Secondary | ICD-10-CM | POA: Diagnosis not present

## 2018-06-11 DIAGNOSIS — K210 Gastro-esophageal reflux disease with esophagitis: Secondary | ICD-10-CM | POA: Diagnosis not present

## 2018-06-11 DIAGNOSIS — Z452 Encounter for adjustment and management of vascular access device: Secondary | ICD-10-CM | POA: Diagnosis not present

## 2018-06-11 DIAGNOSIS — E1142 Type 2 diabetes mellitus with diabetic polyneuropathy: Secondary | ICD-10-CM | POA: Diagnosis not present

## 2018-06-11 DIAGNOSIS — I10 Essential (primary) hypertension: Secondary | ICD-10-CM | POA: Diagnosis not present

## 2018-06-25 DIAGNOSIS — C50912 Malignant neoplasm of unspecified site of left female breast: Secondary | ICD-10-CM | POA: Diagnosis not present

## 2018-06-25 DIAGNOSIS — C801 Malignant (primary) neoplasm, unspecified: Secondary | ICD-10-CM | POA: Diagnosis not present

## 2018-06-25 DIAGNOSIS — Z79811 Long term (current) use of aromatase inhibitors: Secondary | ICD-10-CM | POA: Diagnosis not present

## 2018-06-25 DIAGNOSIS — C7951 Secondary malignant neoplasm of bone: Secondary | ICD-10-CM | POA: Diagnosis not present

## 2018-06-25 DIAGNOSIS — C50911 Malignant neoplasm of unspecified site of right female breast: Secondary | ICD-10-CM | POA: Diagnosis not present

## 2018-06-25 DIAGNOSIS — C7981 Secondary malignant neoplasm of breast: Secondary | ICD-10-CM | POA: Diagnosis not present

## 2018-06-25 DIAGNOSIS — R590 Localized enlarged lymph nodes: Secondary | ICD-10-CM | POA: Diagnosis not present

## 2018-07-03 DIAGNOSIS — C50911 Malignant neoplasm of unspecified site of right female breast: Secondary | ICD-10-CM | POA: Diagnosis not present

## 2018-07-03 DIAGNOSIS — C50812 Malignant neoplasm of overlapping sites of left female breast: Secondary | ICD-10-CM | POA: Diagnosis not present

## 2018-07-03 DIAGNOSIS — Z23 Encounter for immunization: Secondary | ICD-10-CM | POA: Diagnosis not present

## 2018-07-03 DIAGNOSIS — C50912 Malignant neoplasm of unspecified site of left female breast: Secondary | ICD-10-CM | POA: Diagnosis not present

## 2018-07-03 DIAGNOSIS — C7951 Secondary malignant neoplasm of bone: Secondary | ICD-10-CM | POA: Diagnosis not present

## 2018-07-03 DIAGNOSIS — C50811 Malignant neoplasm of overlapping sites of right female breast: Secondary | ICD-10-CM | POA: Diagnosis not present

## 2018-07-03 DIAGNOSIS — Z7951 Long term (current) use of inhaled steroids: Secondary | ICD-10-CM | POA: Diagnosis not present

## 2018-07-03 DIAGNOSIS — R590 Localized enlarged lymph nodes: Secondary | ICD-10-CM | POA: Diagnosis not present

## 2018-07-03 DIAGNOSIS — Z79811 Long term (current) use of aromatase inhibitors: Secondary | ICD-10-CM | POA: Diagnosis not present

## 2018-07-03 DIAGNOSIS — Z170 Estrogen receptor positive status [ER+]: Secondary | ICD-10-CM | POA: Diagnosis not present

## 2018-07-10 DIAGNOSIS — I10 Essential (primary) hypertension: Secondary | ICD-10-CM | POA: Diagnosis not present

## 2018-07-10 DIAGNOSIS — E1142 Type 2 diabetes mellitus with diabetic polyneuropathy: Secondary | ICD-10-CM | POA: Diagnosis not present

## 2018-07-10 DIAGNOSIS — K210 Gastro-esophageal reflux disease with esophagitis: Secondary | ICD-10-CM | POA: Diagnosis not present

## 2018-07-23 DIAGNOSIS — Z452 Encounter for adjustment and management of vascular access device: Secondary | ICD-10-CM | POA: Diagnosis not present

## 2018-08-12 DIAGNOSIS — I213 ST elevation (STEMI) myocardial infarction of unspecified site: Secondary | ICD-10-CM | POA: Diagnosis not present

## 2018-08-12 DIAGNOSIS — Z9013 Acquired absence of bilateral breasts and nipples: Secondary | ICD-10-CM | POA: Diagnosis not present

## 2018-08-12 DIAGNOSIS — R410 Disorientation, unspecified: Secondary | ICD-10-CM | POA: Diagnosis not present

## 2018-08-12 DIAGNOSIS — Z853 Personal history of malignant neoplasm of breast: Secondary | ICD-10-CM | POA: Diagnosis not present

## 2018-08-12 DIAGNOSIS — R0689 Other abnormalities of breathing: Secondary | ICD-10-CM | POA: Diagnosis not present

## 2018-08-12 DIAGNOSIS — R404 Transient alteration of awareness: Secondary | ICD-10-CM | POA: Diagnosis not present

## 2018-08-12 DIAGNOSIS — R079 Chest pain, unspecified: Secondary | ICD-10-CM | POA: Diagnosis not present

## 2018-08-12 DIAGNOSIS — Z85830 Personal history of malignant neoplasm of bone: Secondary | ICD-10-CM | POA: Diagnosis not present

## 2018-08-12 DIAGNOSIS — Z7984 Long term (current) use of oral hypoglycemic drugs: Secondary | ICD-10-CM | POA: Diagnosis not present

## 2018-08-12 DIAGNOSIS — M549 Dorsalgia, unspecified: Secondary | ICD-10-CM | POA: Diagnosis not present

## 2018-08-12 DIAGNOSIS — Z79899 Other long term (current) drug therapy: Secondary | ICD-10-CM | POA: Diagnosis not present

## 2018-08-12 DIAGNOSIS — R0902 Hypoxemia: Secondary | ICD-10-CM | POA: Diagnosis not present

## 2018-08-12 DIAGNOSIS — R7989 Other specified abnormal findings of blood chemistry: Secondary | ICD-10-CM | POA: Diagnosis not present

## 2018-08-12 DIAGNOSIS — I10 Essential (primary) hypertension: Secondary | ICD-10-CM | POA: Diagnosis not present

## 2018-08-12 DIAGNOSIS — E119 Type 2 diabetes mellitus without complications: Secondary | ICD-10-CM | POA: Diagnosis not present

## 2018-08-12 DIAGNOSIS — I4891 Unspecified atrial fibrillation: Secondary | ICD-10-CM | POA: Diagnosis not present

## 2018-08-13 DIAGNOSIS — Z9013 Acquired absence of bilateral breasts and nipples: Secondary | ICD-10-CM | POA: Diagnosis not present

## 2018-08-13 DIAGNOSIS — L03312 Cellulitis of back [any part except buttock]: Secondary | ICD-10-CM | POA: Diagnosis not present

## 2018-08-13 DIAGNOSIS — M4644 Discitis, unspecified, thoracic region: Secondary | ICD-10-CM | POA: Diagnosis not present

## 2018-08-13 DIAGNOSIS — Z780 Asymptomatic menopausal state: Secondary | ICD-10-CM | POA: Diagnosis not present

## 2018-08-13 DIAGNOSIS — E119 Type 2 diabetes mellitus without complications: Secondary | ICD-10-CM | POA: Diagnosis not present

## 2018-08-13 DIAGNOSIS — C50919 Malignant neoplasm of unspecified site of unspecified female breast: Secondary | ICD-10-CM | POA: Diagnosis not present

## 2018-08-13 DIAGNOSIS — S22080D Wedge compression fracture of T11-T12 vertebra, subsequent encounter for fracture with routine healing: Secondary | ICD-10-CM | POA: Diagnosis not present

## 2018-08-13 DIAGNOSIS — L0390 Cellulitis, unspecified: Secondary | ICD-10-CM | POA: Diagnosis not present

## 2018-08-13 DIAGNOSIS — Z853 Personal history of malignant neoplasm of breast: Secondary | ICD-10-CM | POA: Diagnosis not present

## 2018-08-13 DIAGNOSIS — I10 Essential (primary) hypertension: Secondary | ICD-10-CM | POA: Diagnosis not present

## 2018-08-13 DIAGNOSIS — L03012 Cellulitis of left finger: Secondary | ICD-10-CM | POA: Diagnosis not present

## 2018-08-13 DIAGNOSIS — Z79899 Other long term (current) drug therapy: Secondary | ICD-10-CM | POA: Diagnosis not present

## 2018-08-13 DIAGNOSIS — Z8249 Family history of ischemic heart disease and other diseases of the circulatory system: Secondary | ICD-10-CM | POA: Diagnosis not present

## 2018-08-13 DIAGNOSIS — R262 Difficulty in walking, not elsewhere classified: Secondary | ICD-10-CM | POA: Diagnosis not present

## 2018-08-13 DIAGNOSIS — Z886 Allergy status to analgesic agent status: Secondary | ICD-10-CM | POA: Diagnosis not present

## 2018-08-13 DIAGNOSIS — Z833 Family history of diabetes mellitus: Secondary | ICD-10-CM | POA: Diagnosis not present

## 2018-08-13 DIAGNOSIS — E785 Hyperlipidemia, unspecified: Secondary | ICD-10-CM | POA: Diagnosis not present

## 2018-08-13 DIAGNOSIS — J90 Pleural effusion, not elsewhere classified: Secondary | ICD-10-CM | POA: Diagnosis not present

## 2018-08-13 DIAGNOSIS — M810 Age-related osteoporosis without current pathological fracture: Secondary | ICD-10-CM | POA: Diagnosis not present

## 2018-08-13 DIAGNOSIS — Z7984 Long term (current) use of oral hypoglycemic drugs: Secondary | ICD-10-CM | POA: Diagnosis not present

## 2018-08-14 DIAGNOSIS — E119 Type 2 diabetes mellitus without complications: Secondary | ICD-10-CM | POA: Diagnosis not present

## 2018-08-14 DIAGNOSIS — L03012 Cellulitis of left finger: Secondary | ICD-10-CM | POA: Diagnosis not present

## 2018-08-14 DIAGNOSIS — I10 Essential (primary) hypertension: Secondary | ICD-10-CM | POA: Diagnosis not present

## 2018-08-14 DIAGNOSIS — S22080D Wedge compression fracture of T11-T12 vertebra, subsequent encounter for fracture with routine healing: Secondary | ICD-10-CM | POA: Diagnosis not present

## 2018-08-14 DIAGNOSIS — C50919 Malignant neoplasm of unspecified site of unspecified female breast: Secondary | ICD-10-CM | POA: Diagnosis not present

## 2018-08-14 DIAGNOSIS — E785 Hyperlipidemia, unspecified: Secondary | ICD-10-CM | POA: Diagnosis not present

## 2018-08-14 DIAGNOSIS — L03312 Cellulitis of back [any part except buttock]: Secondary | ICD-10-CM | POA: Diagnosis not present

## 2018-08-14 DIAGNOSIS — M810 Age-related osteoporosis without current pathological fracture: Secondary | ICD-10-CM | POA: Diagnosis not present

## 2018-08-14 DIAGNOSIS — L0390 Cellulitis, unspecified: Secondary | ICD-10-CM | POA: Diagnosis not present

## 2018-08-14 DIAGNOSIS — R410 Disorientation, unspecified: Secondary | ICD-10-CM | POA: Diagnosis not present

## 2018-08-15 DIAGNOSIS — M810 Age-related osteoporosis without current pathological fracture: Secondary | ICD-10-CM | POA: Diagnosis not present

## 2018-08-15 DIAGNOSIS — L03312 Cellulitis of back [any part except buttock]: Secondary | ICD-10-CM | POA: Diagnosis not present

## 2018-08-15 DIAGNOSIS — C50919 Malignant neoplasm of unspecified site of unspecified female breast: Secondary | ICD-10-CM | POA: Diagnosis not present

## 2018-08-15 DIAGNOSIS — R410 Disorientation, unspecified: Secondary | ICD-10-CM | POA: Diagnosis not present

## 2018-08-15 DIAGNOSIS — E785 Hyperlipidemia, unspecified: Secondary | ICD-10-CM | POA: Diagnosis not present

## 2018-08-15 DIAGNOSIS — E119 Type 2 diabetes mellitus without complications: Secondary | ICD-10-CM | POA: Diagnosis not present

## 2018-08-15 DIAGNOSIS — I10 Essential (primary) hypertension: Secondary | ICD-10-CM | POA: Diagnosis not present

## 2018-08-15 DIAGNOSIS — S22080D Wedge compression fracture of T11-T12 vertebra, subsequent encounter for fracture with routine healing: Secondary | ICD-10-CM | POA: Diagnosis not present

## 2018-08-15 DIAGNOSIS — L0390 Cellulitis, unspecified: Secondary | ICD-10-CM | POA: Diagnosis not present

## 2018-08-16 DIAGNOSIS — C50919 Malignant neoplasm of unspecified site of unspecified female breast: Secondary | ICD-10-CM | POA: Diagnosis not present

## 2018-08-16 DIAGNOSIS — L03312 Cellulitis of back [any part except buttock]: Secondary | ICD-10-CM | POA: Diagnosis not present

## 2018-08-16 DIAGNOSIS — E119 Type 2 diabetes mellitus without complications: Secondary | ICD-10-CM | POA: Diagnosis not present

## 2018-08-16 DIAGNOSIS — E785 Hyperlipidemia, unspecified: Secondary | ICD-10-CM | POA: Diagnosis not present

## 2018-08-16 DIAGNOSIS — S22080D Wedge compression fracture of T11-T12 vertebra, subsequent encounter for fracture with routine healing: Secondary | ICD-10-CM | POA: Diagnosis not present

## 2018-08-16 DIAGNOSIS — I10 Essential (primary) hypertension: Secondary | ICD-10-CM | POA: Diagnosis not present

## 2018-08-17 DIAGNOSIS — Z7984 Long term (current) use of oral hypoglycemic drugs: Secondary | ICD-10-CM | POA: Diagnosis not present

## 2018-08-17 DIAGNOSIS — S22080D Wedge compression fracture of T11-T12 vertebra, subsequent encounter for fracture with routine healing: Secondary | ICD-10-CM | POA: Diagnosis not present

## 2018-08-17 DIAGNOSIS — I10 Essential (primary) hypertension: Secondary | ICD-10-CM | POA: Diagnosis not present

## 2018-08-17 DIAGNOSIS — E785 Hyperlipidemia, unspecified: Secondary | ICD-10-CM | POA: Diagnosis not present

## 2018-08-17 DIAGNOSIS — R2689 Other abnormalities of gait and mobility: Secondary | ICD-10-CM | POA: Diagnosis not present

## 2018-08-17 DIAGNOSIS — C50912 Malignant neoplasm of unspecified site of left female breast: Secondary | ICD-10-CM | POA: Diagnosis not present

## 2018-08-17 DIAGNOSIS — C50911 Malignant neoplasm of unspecified site of right female breast: Secondary | ICD-10-CM | POA: Diagnosis not present

## 2018-08-17 DIAGNOSIS — C50919 Malignant neoplasm of unspecified site of unspecified female breast: Secondary | ICD-10-CM | POA: Diagnosis not present

## 2018-08-17 DIAGNOSIS — L03312 Cellulitis of back [any part except buttock]: Secondary | ICD-10-CM | POA: Diagnosis not present

## 2018-08-17 DIAGNOSIS — L03012 Cellulitis of left finger: Secondary | ICD-10-CM | POA: Diagnosis not present

## 2018-08-17 DIAGNOSIS — E119 Type 2 diabetes mellitus without complications: Secondary | ICD-10-CM | POA: Diagnosis not present

## 2018-08-19 DIAGNOSIS — E119 Type 2 diabetes mellitus without complications: Secondary | ICD-10-CM | POA: Diagnosis not present

## 2018-08-19 DIAGNOSIS — S22080D Wedge compression fracture of T11-T12 vertebra, subsequent encounter for fracture with routine healing: Secondary | ICD-10-CM | POA: Diagnosis not present

## 2018-08-19 DIAGNOSIS — C50911 Malignant neoplasm of unspecified site of right female breast: Secondary | ICD-10-CM | POA: Diagnosis not present

## 2018-08-19 DIAGNOSIS — I10 Essential (primary) hypertension: Secondary | ICD-10-CM | POA: Diagnosis not present

## 2018-08-19 DIAGNOSIS — R2689 Other abnormalities of gait and mobility: Secondary | ICD-10-CM | POA: Diagnosis not present

## 2018-08-19 DIAGNOSIS — L03312 Cellulitis of back [any part except buttock]: Secondary | ICD-10-CM | POA: Diagnosis not present

## 2018-08-22 DIAGNOSIS — L03312 Cellulitis of back [any part except buttock]: Secondary | ICD-10-CM | POA: Diagnosis not present

## 2018-08-22 DIAGNOSIS — S22080D Wedge compression fracture of T11-T12 vertebra, subsequent encounter for fracture with routine healing: Secondary | ICD-10-CM | POA: Diagnosis not present

## 2018-08-22 DIAGNOSIS — I10 Essential (primary) hypertension: Secondary | ICD-10-CM | POA: Diagnosis not present

## 2018-08-22 DIAGNOSIS — C50911 Malignant neoplasm of unspecified site of right female breast: Secondary | ICD-10-CM | POA: Diagnosis not present

## 2018-08-22 DIAGNOSIS — E119 Type 2 diabetes mellitus without complications: Secondary | ICD-10-CM | POA: Diagnosis not present

## 2018-08-22 DIAGNOSIS — R2689 Other abnormalities of gait and mobility: Secondary | ICD-10-CM | POA: Diagnosis not present

## 2018-08-26 DIAGNOSIS — L03312 Cellulitis of back [any part except buttock]: Secondary | ICD-10-CM | POA: Diagnosis not present

## 2018-08-26 DIAGNOSIS — R2689 Other abnormalities of gait and mobility: Secondary | ICD-10-CM | POA: Diagnosis not present

## 2018-08-26 DIAGNOSIS — C50911 Malignant neoplasm of unspecified site of right female breast: Secondary | ICD-10-CM | POA: Diagnosis not present

## 2018-08-26 DIAGNOSIS — I10 Essential (primary) hypertension: Secondary | ICD-10-CM | POA: Diagnosis not present

## 2018-08-26 DIAGNOSIS — E119 Type 2 diabetes mellitus without complications: Secondary | ICD-10-CM | POA: Diagnosis not present

## 2018-08-26 DIAGNOSIS — S22080D Wedge compression fracture of T11-T12 vertebra, subsequent encounter for fracture with routine healing: Secondary | ICD-10-CM | POA: Diagnosis not present

## 2018-08-27 DIAGNOSIS — L03313 Cellulitis of chest wall: Secondary | ICD-10-CM | POA: Diagnosis not present

## 2018-08-29 DIAGNOSIS — E119 Type 2 diabetes mellitus without complications: Secondary | ICD-10-CM | POA: Diagnosis not present

## 2018-08-29 DIAGNOSIS — I10 Essential (primary) hypertension: Secondary | ICD-10-CM | POA: Diagnosis not present

## 2018-08-29 DIAGNOSIS — L03312 Cellulitis of back [any part except buttock]: Secondary | ICD-10-CM | POA: Diagnosis not present

## 2018-08-29 DIAGNOSIS — C50911 Malignant neoplasm of unspecified site of right female breast: Secondary | ICD-10-CM | POA: Diagnosis not present

## 2018-08-29 DIAGNOSIS — R2689 Other abnormalities of gait and mobility: Secondary | ICD-10-CM | POA: Diagnosis not present

## 2018-08-29 DIAGNOSIS — S22080D Wedge compression fracture of T11-T12 vertebra, subsequent encounter for fracture with routine healing: Secondary | ICD-10-CM | POA: Diagnosis not present

## 2018-09-01 DIAGNOSIS — S22080D Wedge compression fracture of T11-T12 vertebra, subsequent encounter for fracture with routine healing: Secondary | ICD-10-CM | POA: Diagnosis not present

## 2018-09-01 DIAGNOSIS — R2689 Other abnormalities of gait and mobility: Secondary | ICD-10-CM | POA: Diagnosis not present

## 2018-09-01 DIAGNOSIS — L03312 Cellulitis of back [any part except buttock]: Secondary | ICD-10-CM | POA: Diagnosis not present

## 2018-09-01 DIAGNOSIS — C50911 Malignant neoplasm of unspecified site of right female breast: Secondary | ICD-10-CM | POA: Diagnosis not present

## 2018-09-01 DIAGNOSIS — I10 Essential (primary) hypertension: Secondary | ICD-10-CM | POA: Diagnosis not present

## 2018-09-01 DIAGNOSIS — E119 Type 2 diabetes mellitus without complications: Secondary | ICD-10-CM | POA: Diagnosis not present

## 2018-09-02 DIAGNOSIS — R197 Diarrhea, unspecified: Secondary | ICD-10-CM | POA: Diagnosis not present

## 2018-09-02 DIAGNOSIS — Z170 Estrogen receptor positive status [ER+]: Secondary | ICD-10-CM | POA: Diagnosis not present

## 2018-09-02 DIAGNOSIS — Z9013 Acquired absence of bilateral breasts and nipples: Secondary | ICD-10-CM | POA: Diagnosis not present

## 2018-09-02 DIAGNOSIS — Z79899 Other long term (current) drug therapy: Secondary | ICD-10-CM | POA: Diagnosis not present

## 2018-09-02 DIAGNOSIS — C50012 Malignant neoplasm of nipple and areola, left female breast: Secondary | ICD-10-CM | POA: Diagnosis not present

## 2018-09-02 DIAGNOSIS — M810 Age-related osteoporosis without current pathological fracture: Secondary | ICD-10-CM | POA: Diagnosis not present

## 2018-09-02 DIAGNOSIS — M546 Pain in thoracic spine: Secondary | ICD-10-CM | POA: Diagnosis not present

## 2018-09-02 DIAGNOSIS — C50811 Malignant neoplasm of overlapping sites of right female breast: Secondary | ICD-10-CM | POA: Diagnosis not present

## 2018-09-02 DIAGNOSIS — R591 Generalized enlarged lymph nodes: Secondary | ICD-10-CM | POA: Diagnosis not present

## 2018-09-02 DIAGNOSIS — C50011 Malignant neoplasm of nipple and areola, right female breast: Secondary | ICD-10-CM | POA: Diagnosis not present

## 2018-09-02 DIAGNOSIS — Z9849 Cataract extraction status, unspecified eye: Secondary | ICD-10-CM | POA: Diagnosis not present

## 2018-09-02 DIAGNOSIS — C50812 Malignant neoplasm of overlapping sites of left female breast: Secondary | ICD-10-CM | POA: Diagnosis not present

## 2018-09-02 DIAGNOSIS — E1136 Type 2 diabetes mellitus with diabetic cataract: Secondary | ICD-10-CM | POA: Diagnosis not present

## 2018-09-02 DIAGNOSIS — E7800 Pure hypercholesterolemia, unspecified: Secondary | ICD-10-CM | POA: Diagnosis not present

## 2018-09-02 DIAGNOSIS — R590 Localized enlarged lymph nodes: Secondary | ICD-10-CM | POA: Diagnosis not present

## 2018-09-02 DIAGNOSIS — Z79811 Long term (current) use of aromatase inhibitors: Secondary | ICD-10-CM | POA: Diagnosis not present

## 2018-09-02 DIAGNOSIS — I10 Essential (primary) hypertension: Secondary | ICD-10-CM | POA: Diagnosis not present

## 2018-09-02 DIAGNOSIS — Z7984 Long term (current) use of oral hypoglycemic drugs: Secondary | ICD-10-CM | POA: Diagnosis not present

## 2018-09-02 DIAGNOSIS — C7951 Secondary malignant neoplasm of bone: Secondary | ICD-10-CM | POA: Diagnosis not present

## 2018-09-02 DIAGNOSIS — C50919 Malignant neoplasm of unspecified site of unspecified female breast: Secondary | ICD-10-CM | POA: Diagnosis not present

## 2018-09-03 DIAGNOSIS — C50911 Malignant neoplasm of unspecified site of right female breast: Secondary | ICD-10-CM | POA: Diagnosis not present

## 2018-09-03 DIAGNOSIS — L03312 Cellulitis of back [any part except buttock]: Secondary | ICD-10-CM | POA: Diagnosis not present

## 2018-09-03 DIAGNOSIS — R2689 Other abnormalities of gait and mobility: Secondary | ICD-10-CM | POA: Diagnosis not present

## 2018-09-03 DIAGNOSIS — S22080D Wedge compression fracture of T11-T12 vertebra, subsequent encounter for fracture with routine healing: Secondary | ICD-10-CM | POA: Diagnosis not present

## 2018-09-03 DIAGNOSIS — I10 Essential (primary) hypertension: Secondary | ICD-10-CM | POA: Diagnosis not present

## 2018-09-03 DIAGNOSIS — E119 Type 2 diabetes mellitus without complications: Secondary | ICD-10-CM | POA: Diagnosis not present

## 2018-09-08 DIAGNOSIS — S22080D Wedge compression fracture of T11-T12 vertebra, subsequent encounter for fracture with routine healing: Secondary | ICD-10-CM | POA: Diagnosis not present

## 2018-09-08 DIAGNOSIS — L03312 Cellulitis of back [any part except buttock]: Secondary | ICD-10-CM | POA: Diagnosis not present

## 2018-09-08 DIAGNOSIS — R2689 Other abnormalities of gait and mobility: Secondary | ICD-10-CM | POA: Diagnosis not present

## 2018-09-08 DIAGNOSIS — C50911 Malignant neoplasm of unspecified site of right female breast: Secondary | ICD-10-CM | POA: Diagnosis not present

## 2018-09-08 DIAGNOSIS — I10 Essential (primary) hypertension: Secondary | ICD-10-CM | POA: Diagnosis not present

## 2018-09-08 DIAGNOSIS — E119 Type 2 diabetes mellitus without complications: Secondary | ICD-10-CM | POA: Diagnosis not present

## 2018-09-10 DIAGNOSIS — R2689 Other abnormalities of gait and mobility: Secondary | ICD-10-CM | POA: Diagnosis not present

## 2018-09-10 DIAGNOSIS — E119 Type 2 diabetes mellitus without complications: Secondary | ICD-10-CM | POA: Diagnosis not present

## 2018-09-10 DIAGNOSIS — L03312 Cellulitis of back [any part except buttock]: Secondary | ICD-10-CM | POA: Diagnosis not present

## 2018-09-10 DIAGNOSIS — I10 Essential (primary) hypertension: Secondary | ICD-10-CM | POA: Diagnosis not present

## 2018-09-10 DIAGNOSIS — C50911 Malignant neoplasm of unspecified site of right female breast: Secondary | ICD-10-CM | POA: Diagnosis not present

## 2018-09-10 DIAGNOSIS — S22080D Wedge compression fracture of T11-T12 vertebra, subsequent encounter for fracture with routine healing: Secondary | ICD-10-CM | POA: Diagnosis not present

## 2018-09-15 DIAGNOSIS — L03312 Cellulitis of back [any part except buttock]: Secondary | ICD-10-CM | POA: Diagnosis not present

## 2018-09-15 DIAGNOSIS — I10 Essential (primary) hypertension: Secondary | ICD-10-CM | POA: Diagnosis not present

## 2018-09-15 DIAGNOSIS — C50911 Malignant neoplasm of unspecified site of right female breast: Secondary | ICD-10-CM | POA: Diagnosis not present

## 2018-09-15 DIAGNOSIS — S22080D Wedge compression fracture of T11-T12 vertebra, subsequent encounter for fracture with routine healing: Secondary | ICD-10-CM | POA: Diagnosis not present

## 2018-09-15 DIAGNOSIS — E119 Type 2 diabetes mellitus without complications: Secondary | ICD-10-CM | POA: Diagnosis not present

## 2018-09-15 DIAGNOSIS — R2689 Other abnormalities of gait and mobility: Secondary | ICD-10-CM | POA: Diagnosis not present

## 2018-09-16 DIAGNOSIS — I10 Essential (primary) hypertension: Secondary | ICD-10-CM | POA: Diagnosis not present

## 2018-09-16 DIAGNOSIS — R2689 Other abnormalities of gait and mobility: Secondary | ICD-10-CM | POA: Diagnosis not present

## 2018-09-16 DIAGNOSIS — C50911 Malignant neoplasm of unspecified site of right female breast: Secondary | ICD-10-CM | POA: Diagnosis not present

## 2018-09-16 DIAGNOSIS — E119 Type 2 diabetes mellitus without complications: Secondary | ICD-10-CM | POA: Diagnosis not present

## 2018-09-16 DIAGNOSIS — S22080D Wedge compression fracture of T11-T12 vertebra, subsequent encounter for fracture with routine healing: Secondary | ICD-10-CM | POA: Diagnosis not present

## 2018-09-16 DIAGNOSIS — L03312 Cellulitis of back [any part except buttock]: Secondary | ICD-10-CM | POA: Diagnosis not present

## 2018-09-17 DIAGNOSIS — R2689 Other abnormalities of gait and mobility: Secondary | ICD-10-CM | POA: Diagnosis not present

## 2018-09-17 DIAGNOSIS — I10 Essential (primary) hypertension: Secondary | ICD-10-CM | POA: Diagnosis not present

## 2018-09-17 DIAGNOSIS — S22080D Wedge compression fracture of T11-T12 vertebra, subsequent encounter for fracture with routine healing: Secondary | ICD-10-CM | POA: Diagnosis not present

## 2018-09-17 DIAGNOSIS — L03312 Cellulitis of back [any part except buttock]: Secondary | ICD-10-CM | POA: Diagnosis not present

## 2018-09-17 DIAGNOSIS — E119 Type 2 diabetes mellitus without complications: Secondary | ICD-10-CM | POA: Diagnosis not present

## 2018-09-17 DIAGNOSIS — C50911 Malignant neoplasm of unspecified site of right female breast: Secondary | ICD-10-CM | POA: Diagnosis not present

## 2018-10-07 DIAGNOSIS — E119 Type 2 diabetes mellitus without complications: Secondary | ICD-10-CM | POA: Diagnosis not present

## 2018-10-07 DIAGNOSIS — I10 Essential (primary) hypertension: Secondary | ICD-10-CM | POA: Diagnosis not present

## 2018-10-14 DIAGNOSIS — Z452 Encounter for adjustment and management of vascular access device: Secondary | ICD-10-CM | POA: Diagnosis not present

## 2018-11-03 DIAGNOSIS — C7951 Secondary malignant neoplasm of bone: Secondary | ICD-10-CM | POA: Diagnosis not present

## 2018-11-03 DIAGNOSIS — Z9849 Cataract extraction status, unspecified eye: Secondary | ICD-10-CM | POA: Diagnosis not present

## 2018-11-03 DIAGNOSIS — C50012 Malignant neoplasm of nipple and areola, left female breast: Secondary | ICD-10-CM | POA: Diagnosis not present

## 2018-11-03 DIAGNOSIS — R591 Generalized enlarged lymph nodes: Secondary | ICD-10-CM | POA: Diagnosis not present

## 2018-11-03 DIAGNOSIS — E7800 Pure hypercholesterolemia, unspecified: Secondary | ICD-10-CM | POA: Diagnosis not present

## 2018-11-03 DIAGNOSIS — M1990 Unspecified osteoarthritis, unspecified site: Secondary | ICD-10-CM | POA: Diagnosis not present

## 2018-11-03 DIAGNOSIS — E1136 Type 2 diabetes mellitus with diabetic cataract: Secondary | ICD-10-CM | POA: Diagnosis not present

## 2018-11-03 DIAGNOSIS — C50011 Malignant neoplasm of nipple and areola, right female breast: Secondary | ICD-10-CM | POA: Diagnosis not present

## 2018-11-03 DIAGNOSIS — I10 Essential (primary) hypertension: Secondary | ICD-10-CM | POA: Diagnosis not present

## 2018-11-03 DIAGNOSIS — M810 Age-related osteoporosis without current pathological fracture: Secondary | ICD-10-CM | POA: Diagnosis not present

## 2018-11-03 DIAGNOSIS — Z79811 Long term (current) use of aromatase inhibitors: Secondary | ICD-10-CM | POA: Diagnosis not present

## 2018-11-03 DIAGNOSIS — C50919 Malignant neoplasm of unspecified site of unspecified female breast: Secondary | ICD-10-CM | POA: Diagnosis not present

## 2018-11-03 DIAGNOSIS — M546 Pain in thoracic spine: Secondary | ICD-10-CM | POA: Diagnosis not present

## 2018-11-03 DIAGNOSIS — R590 Localized enlarged lymph nodes: Secondary | ICD-10-CM | POA: Diagnosis not present

## 2018-11-03 DIAGNOSIS — Z9013 Acquired absence of bilateral breasts and nipples: Secondary | ICD-10-CM | POA: Diagnosis not present

## 2018-11-03 DIAGNOSIS — Z170 Estrogen receptor positive status [ER+]: Secondary | ICD-10-CM | POA: Diagnosis not present

## 2018-11-06 DIAGNOSIS — I10 Essential (primary) hypertension: Secondary | ICD-10-CM | POA: Diagnosis not present

## 2018-11-06 DIAGNOSIS — E119 Type 2 diabetes mellitus without complications: Secondary | ICD-10-CM | POA: Diagnosis not present

## 2018-11-25 DIAGNOSIS — Z452 Encounter for adjustment and management of vascular access device: Secondary | ICD-10-CM | POA: Diagnosis not present

## 2018-12-01 DIAGNOSIS — Z1389 Encounter for screening for other disorder: Secondary | ICD-10-CM | POA: Diagnosis not present

## 2018-12-01 DIAGNOSIS — I10 Essential (primary) hypertension: Secondary | ICD-10-CM | POA: Diagnosis not present

## 2018-12-01 DIAGNOSIS — E785 Hyperlipidemia, unspecified: Secondary | ICD-10-CM | POA: Diagnosis not present

## 2018-12-01 DIAGNOSIS — L03313 Cellulitis of chest wall: Secondary | ICD-10-CM | POA: Diagnosis not present

## 2018-12-01 DIAGNOSIS — E1142 Type 2 diabetes mellitus with diabetic polyneuropathy: Secondary | ICD-10-CM | POA: Diagnosis not present

## 2018-12-01 DIAGNOSIS — Z0000 Encounter for general adult medical examination without abnormal findings: Secondary | ICD-10-CM | POA: Diagnosis not present

## 2018-12-05 DIAGNOSIS — I10 Essential (primary) hypertension: Secondary | ICD-10-CM | POA: Diagnosis not present

## 2018-12-05 DIAGNOSIS — E785 Hyperlipidemia, unspecified: Secondary | ICD-10-CM | POA: Diagnosis not present

## 2018-12-05 DIAGNOSIS — E1142 Type 2 diabetes mellitus with diabetic polyneuropathy: Secondary | ICD-10-CM | POA: Diagnosis not present

## 2019-01-02 DIAGNOSIS — E785 Hyperlipidemia, unspecified: Secondary | ICD-10-CM | POA: Diagnosis not present

## 2019-01-02 DIAGNOSIS — I10 Essential (primary) hypertension: Secondary | ICD-10-CM | POA: Diagnosis not present

## 2019-01-02 DIAGNOSIS — E1142 Type 2 diabetes mellitus with diabetic polyneuropathy: Secondary | ICD-10-CM | POA: Diagnosis not present

## 2019-01-13 DIAGNOSIS — Z452 Encounter for adjustment and management of vascular access device: Secondary | ICD-10-CM | POA: Diagnosis not present

## 2019-02-10 DIAGNOSIS — E785 Hyperlipidemia, unspecified: Secondary | ICD-10-CM | POA: Diagnosis not present

## 2019-02-10 DIAGNOSIS — E1142 Type 2 diabetes mellitus with diabetic polyneuropathy: Secondary | ICD-10-CM | POA: Diagnosis not present

## 2019-02-10 DIAGNOSIS — I10 Essential (primary) hypertension: Secondary | ICD-10-CM | POA: Diagnosis not present

## 2019-02-24 DIAGNOSIS — Z452 Encounter for adjustment and management of vascular access device: Secondary | ICD-10-CM | POA: Diagnosis not present

## 2019-03-02 DIAGNOSIS — Z794 Long term (current) use of insulin: Secondary | ICD-10-CM | POA: Diagnosis not present

## 2019-03-02 DIAGNOSIS — C7951 Secondary malignant neoplasm of bone: Secondary | ICD-10-CM | POA: Diagnosis not present

## 2019-03-02 DIAGNOSIS — R590 Localized enlarged lymph nodes: Secondary | ICD-10-CM | POA: Diagnosis not present

## 2019-03-02 DIAGNOSIS — Z79811 Long term (current) use of aromatase inhibitors: Secondary | ICD-10-CM | POA: Diagnosis not present

## 2019-03-02 DIAGNOSIS — Z170 Estrogen receptor positive status [ER+]: Secondary | ICD-10-CM | POA: Diagnosis not present

## 2019-03-02 DIAGNOSIS — R591 Generalized enlarged lymph nodes: Secondary | ICD-10-CM | POA: Diagnosis not present

## 2019-03-02 DIAGNOSIS — Z9013 Acquired absence of bilateral breasts and nipples: Secondary | ICD-10-CM | POA: Diagnosis not present

## 2019-03-02 DIAGNOSIS — Z79899 Other long term (current) drug therapy: Secondary | ICD-10-CM | POA: Diagnosis not present

## 2019-03-02 DIAGNOSIS — C50012 Malignant neoplasm of nipple and areola, left female breast: Secondary | ICD-10-CM | POA: Diagnosis not present

## 2019-03-02 DIAGNOSIS — E7800 Pure hypercholesterolemia, unspecified: Secondary | ICD-10-CM | POA: Diagnosis not present

## 2019-03-02 DIAGNOSIS — E1136 Type 2 diabetes mellitus with diabetic cataract: Secondary | ICD-10-CM | POA: Diagnosis not present

## 2019-03-02 DIAGNOSIS — I10 Essential (primary) hypertension: Secondary | ICD-10-CM | POA: Diagnosis not present

## 2019-03-02 DIAGNOSIS — C50011 Malignant neoplasm of nipple and areola, right female breast: Secondary | ICD-10-CM | POA: Diagnosis not present

## 2019-03-02 DIAGNOSIS — Z95828 Presence of other vascular implants and grafts: Secondary | ICD-10-CM | POA: Diagnosis not present

## 2019-03-02 DIAGNOSIS — C50919 Malignant neoplasm of unspecified site of unspecified female breast: Secondary | ICD-10-CM | POA: Diagnosis not present

## 2019-03-02 DIAGNOSIS — M810 Age-related osteoporosis without current pathological fracture: Secondary | ICD-10-CM | POA: Diagnosis not present

## 2019-03-02 DIAGNOSIS — M546 Pain in thoracic spine: Secondary | ICD-10-CM | POA: Diagnosis not present

## 2019-03-02 DIAGNOSIS — M1990 Unspecified osteoarthritis, unspecified site: Secondary | ICD-10-CM | POA: Diagnosis not present

## 2019-03-02 DIAGNOSIS — E559 Vitamin D deficiency, unspecified: Secondary | ICD-10-CM | POA: Diagnosis not present

## 2019-03-03 DIAGNOSIS — I10 Essential (primary) hypertension: Secondary | ICD-10-CM | POA: Diagnosis not present

## 2019-03-03 DIAGNOSIS — M545 Low back pain: Secondary | ICD-10-CM | POA: Diagnosis not present

## 2019-03-03 DIAGNOSIS — E1142 Type 2 diabetes mellitus with diabetic polyneuropathy: Secondary | ICD-10-CM | POA: Diagnosis not present

## 2019-03-03 DIAGNOSIS — E785 Hyperlipidemia, unspecified: Secondary | ICD-10-CM | POA: Diagnosis not present

## 2019-03-04 DIAGNOSIS — M810 Age-related osteoporosis without current pathological fracture: Secondary | ICD-10-CM | POA: Diagnosis not present

## 2019-03-11 DIAGNOSIS — M545 Low back pain: Secondary | ICD-10-CM | POA: Diagnosis not present

## 2019-03-11 DIAGNOSIS — E1142 Type 2 diabetes mellitus with diabetic polyneuropathy: Secondary | ICD-10-CM | POA: Diagnosis not present

## 2019-03-11 DIAGNOSIS — I10 Essential (primary) hypertension: Secondary | ICD-10-CM | POA: Diagnosis not present

## 2019-03-11 DIAGNOSIS — E785 Hyperlipidemia, unspecified: Secondary | ICD-10-CM | POA: Diagnosis not present

## 2019-04-07 DIAGNOSIS — Z452 Encounter for adjustment and management of vascular access device: Secondary | ICD-10-CM | POA: Diagnosis not present

## 2019-04-29 DIAGNOSIS — M545 Low back pain: Secondary | ICD-10-CM | POA: Diagnosis not present

## 2019-04-29 DIAGNOSIS — E785 Hyperlipidemia, unspecified: Secondary | ICD-10-CM | POA: Diagnosis not present

## 2019-04-29 DIAGNOSIS — I10 Essential (primary) hypertension: Secondary | ICD-10-CM | POA: Diagnosis not present

## 2019-04-29 DIAGNOSIS — E1142 Type 2 diabetes mellitus with diabetic polyneuropathy: Secondary | ICD-10-CM | POA: Diagnosis not present

## 2019-05-01 ENCOUNTER — Other Ambulatory Visit: Payer: Self-pay

## 2019-05-19 DIAGNOSIS — Z452 Encounter for adjustment and management of vascular access device: Secondary | ICD-10-CM | POA: Diagnosis not present

## 2019-06-09 DIAGNOSIS — M545 Low back pain: Secondary | ICD-10-CM | POA: Diagnosis not present

## 2019-06-09 DIAGNOSIS — I10 Essential (primary) hypertension: Secondary | ICD-10-CM | POA: Diagnosis not present

## 2019-06-09 DIAGNOSIS — N183 Chronic kidney disease, stage 3 (moderate): Secondary | ICD-10-CM | POA: Diagnosis not present

## 2019-06-09 DIAGNOSIS — E1142 Type 2 diabetes mellitus with diabetic polyneuropathy: Secondary | ICD-10-CM | POA: Diagnosis not present

## 2019-06-09 DIAGNOSIS — E785 Hyperlipidemia, unspecified: Secondary | ICD-10-CM | POA: Diagnosis not present

## 2019-07-01 DIAGNOSIS — C7951 Secondary malignant neoplasm of bone: Secondary | ICD-10-CM | POA: Diagnosis not present

## 2019-07-01 DIAGNOSIS — Z23 Encounter for immunization: Secondary | ICD-10-CM | POA: Diagnosis not present

## 2019-07-01 DIAGNOSIS — R590 Localized enlarged lymph nodes: Secondary | ICD-10-CM | POA: Diagnosis not present

## 2019-07-01 DIAGNOSIS — I10 Essential (primary) hypertension: Secondary | ICD-10-CM | POA: Diagnosis not present

## 2019-07-01 DIAGNOSIS — C50919 Malignant neoplasm of unspecified site of unspecified female breast: Secondary | ICD-10-CM | POA: Diagnosis not present

## 2019-07-01 DIAGNOSIS — E559 Vitamin D deficiency, unspecified: Secondary | ICD-10-CM | POA: Diagnosis not present

## 2019-07-01 DIAGNOSIS — Z79811 Long term (current) use of aromatase inhibitors: Secondary | ICD-10-CM | POA: Diagnosis not present

## 2019-07-01 DIAGNOSIS — M810 Age-related osteoporosis without current pathological fracture: Secondary | ICD-10-CM | POA: Diagnosis not present

## 2019-07-01 DIAGNOSIS — C50012 Malignant neoplasm of nipple and areola, left female breast: Secondary | ICD-10-CM | POA: Diagnosis not present

## 2019-07-01 DIAGNOSIS — C50011 Malignant neoplasm of nipple and areola, right female breast: Secondary | ICD-10-CM | POA: Diagnosis not present

## 2019-07-01 DIAGNOSIS — E119 Type 2 diabetes mellitus without complications: Secondary | ICD-10-CM | POA: Diagnosis not present

## 2019-07-01 DIAGNOSIS — E7800 Pure hypercholesterolemia, unspecified: Secondary | ICD-10-CM | POA: Diagnosis not present

## 2019-07-01 DIAGNOSIS — E079 Disorder of thyroid, unspecified: Secondary | ICD-10-CM | POA: Diagnosis not present

## 2019-07-01 DIAGNOSIS — Z452 Encounter for adjustment and management of vascular access device: Secondary | ICD-10-CM | POA: Diagnosis not present

## 2019-07-01 DIAGNOSIS — Z79899 Other long term (current) drug therapy: Secondary | ICD-10-CM | POA: Diagnosis not present

## 2019-07-14 DIAGNOSIS — Z452 Encounter for adjustment and management of vascular access device: Secondary | ICD-10-CM | POA: Diagnosis not present

## 2019-08-03 DIAGNOSIS — I10 Essential (primary) hypertension: Secondary | ICD-10-CM | POA: Diagnosis not present

## 2019-08-03 DIAGNOSIS — E1142 Type 2 diabetes mellitus with diabetic polyneuropathy: Secondary | ICD-10-CM | POA: Diagnosis not present

## 2019-08-03 DIAGNOSIS — E785 Hyperlipidemia, unspecified: Secondary | ICD-10-CM | POA: Diagnosis not present

## 2019-08-03 DIAGNOSIS — M545 Low back pain: Secondary | ICD-10-CM | POA: Diagnosis not present

## 2019-08-25 DIAGNOSIS — Z452 Encounter for adjustment and management of vascular access device: Secondary | ICD-10-CM | POA: Diagnosis not present

## 2019-09-01 DIAGNOSIS — E7800 Pure hypercholesterolemia, unspecified: Secondary | ICD-10-CM | POA: Diagnosis not present

## 2019-09-01 DIAGNOSIS — M810 Age-related osteoporosis without current pathological fracture: Secondary | ICD-10-CM | POA: Diagnosis not present

## 2019-09-01 DIAGNOSIS — E079 Disorder of thyroid, unspecified: Secondary | ICD-10-CM | POA: Diagnosis not present

## 2019-09-01 DIAGNOSIS — Z170 Estrogen receptor positive status [ER+]: Secondary | ICD-10-CM | POA: Diagnosis not present

## 2019-09-01 DIAGNOSIS — C50012 Malignant neoplasm of nipple and areola, left female breast: Secondary | ICD-10-CM | POA: Diagnosis not present

## 2019-09-01 DIAGNOSIS — C50011 Malignant neoplasm of nipple and areola, right female breast: Secondary | ICD-10-CM | POA: Diagnosis not present

## 2019-09-01 DIAGNOSIS — Z79811 Long term (current) use of aromatase inhibitors: Secondary | ICD-10-CM | POA: Diagnosis not present

## 2019-09-01 DIAGNOSIS — K529 Noninfective gastroenteritis and colitis, unspecified: Secondary | ICD-10-CM | POA: Diagnosis not present

## 2019-09-01 DIAGNOSIS — Z9849 Cataract extraction status, unspecified eye: Secondary | ICD-10-CM | POA: Diagnosis not present

## 2019-09-01 DIAGNOSIS — C50919 Malignant neoplasm of unspecified site of unspecified female breast: Secondary | ICD-10-CM | POA: Diagnosis not present

## 2019-09-01 DIAGNOSIS — R591 Generalized enlarged lymph nodes: Secondary | ICD-10-CM | POA: Diagnosis not present

## 2019-09-01 DIAGNOSIS — R590 Localized enlarged lymph nodes: Secondary | ICD-10-CM | POA: Diagnosis not present

## 2019-09-01 DIAGNOSIS — E1136 Type 2 diabetes mellitus with diabetic cataract: Secondary | ICD-10-CM | POA: Diagnosis not present

## 2019-09-01 DIAGNOSIS — C7951 Secondary malignant neoplasm of bone: Secondary | ICD-10-CM | POA: Diagnosis not present

## 2019-09-01 DIAGNOSIS — I10 Essential (primary) hypertension: Secondary | ICD-10-CM | POA: Diagnosis not present

## 2019-09-01 DIAGNOSIS — M546 Pain in thoracic spine: Secondary | ICD-10-CM | POA: Diagnosis not present

## 2019-09-01 DIAGNOSIS — K6812 Psoas muscle abscess: Secondary | ICD-10-CM | POA: Diagnosis not present

## 2019-09-01 DIAGNOSIS — Z9013 Acquired absence of bilateral breasts and nipples: Secondary | ICD-10-CM | POA: Diagnosis not present

## 2019-09-03 DIAGNOSIS — M810 Age-related osteoporosis without current pathological fracture: Secondary | ICD-10-CM | POA: Diagnosis not present

## 2019-09-07 DIAGNOSIS — E1142 Type 2 diabetes mellitus with diabetic polyneuropathy: Secondary | ICD-10-CM | POA: Diagnosis not present

## 2019-09-07 DIAGNOSIS — E785 Hyperlipidemia, unspecified: Secondary | ICD-10-CM | POA: Diagnosis not present

## 2019-09-07 DIAGNOSIS — M545 Low back pain: Secondary | ICD-10-CM | POA: Diagnosis not present

## 2019-09-07 DIAGNOSIS — I10 Essential (primary) hypertension: Secondary | ICD-10-CM | POA: Diagnosis not present

## 2019-09-07 DIAGNOSIS — N183 Chronic kidney disease, stage 3 unspecified: Secondary | ICD-10-CM | POA: Diagnosis not present

## 2019-09-09 DIAGNOSIS — E1142 Type 2 diabetes mellitus with diabetic polyneuropathy: Secondary | ICD-10-CM | POA: Diagnosis not present

## 2019-09-09 DIAGNOSIS — E785 Hyperlipidemia, unspecified: Secondary | ICD-10-CM | POA: Diagnosis not present

## 2019-09-09 DIAGNOSIS — I10 Essential (primary) hypertension: Secondary | ICD-10-CM | POA: Diagnosis not present

## 2019-10-06 DIAGNOSIS — Z452 Encounter for adjustment and management of vascular access device: Secondary | ICD-10-CM | POA: Diagnosis not present

## 2019-10-08 DIAGNOSIS — E785 Hyperlipidemia, unspecified: Secondary | ICD-10-CM | POA: Diagnosis not present

## 2019-10-08 DIAGNOSIS — E1142 Type 2 diabetes mellitus with diabetic polyneuropathy: Secondary | ICD-10-CM | POA: Diagnosis not present

## 2019-10-08 DIAGNOSIS — I10 Essential (primary) hypertension: Secondary | ICD-10-CM | POA: Diagnosis not present

## 2019-11-17 DIAGNOSIS — Z452 Encounter for adjustment and management of vascular access device: Secondary | ICD-10-CM | POA: Diagnosis not present

## 2019-12-07 DIAGNOSIS — M25561 Pain in right knee: Secondary | ICD-10-CM | POA: Diagnosis not present

## 2019-12-07 DIAGNOSIS — Z0000 Encounter for general adult medical examination without abnormal findings: Secondary | ICD-10-CM | POA: Diagnosis not present

## 2019-12-07 DIAGNOSIS — Z1331 Encounter for screening for depression: Secondary | ICD-10-CM | POA: Diagnosis not present

## 2019-12-07 DIAGNOSIS — I10 Essential (primary) hypertension: Secondary | ICD-10-CM | POA: Diagnosis not present

## 2019-12-07 DIAGNOSIS — E1142 Type 2 diabetes mellitus with diabetic polyneuropathy: Secondary | ICD-10-CM | POA: Diagnosis not present

## 2019-12-07 DIAGNOSIS — S8991XA Unspecified injury of right lower leg, initial encounter: Secondary | ICD-10-CM | POA: Diagnosis not present

## 2019-12-07 DIAGNOSIS — E7849 Other hyperlipidemia: Secondary | ICD-10-CM | POA: Diagnosis not present

## 2019-12-07 DIAGNOSIS — M545 Low back pain: Secondary | ICD-10-CM | POA: Diagnosis not present

## 2019-12-07 DIAGNOSIS — M11261 Other chondrocalcinosis, right knee: Secondary | ICD-10-CM | POA: Diagnosis not present

## 2019-12-07 DIAGNOSIS — N1831 Chronic kidney disease, stage 3a: Secondary | ICD-10-CM | POA: Diagnosis not present

## 2019-12-08 DIAGNOSIS — C50012 Malignant neoplasm of nipple and areola, left female breast: Secondary | ICD-10-CM | POA: Diagnosis not present

## 2019-12-08 DIAGNOSIS — R590 Localized enlarged lymph nodes: Secondary | ICD-10-CM | POA: Diagnosis not present

## 2019-12-08 DIAGNOSIS — Z9013 Acquired absence of bilateral breasts and nipples: Secondary | ICD-10-CM | POA: Diagnosis not present

## 2019-12-08 DIAGNOSIS — Z170 Estrogen receptor positive status [ER+]: Secondary | ICD-10-CM | POA: Diagnosis not present

## 2019-12-08 DIAGNOSIS — R591 Generalized enlarged lymph nodes: Secondary | ICD-10-CM | POA: Diagnosis not present

## 2019-12-08 DIAGNOSIS — I10 Essential (primary) hypertension: Secondary | ICD-10-CM | POA: Diagnosis not present

## 2019-12-08 DIAGNOSIS — E079 Disorder of thyroid, unspecified: Secondary | ICD-10-CM | POA: Diagnosis not present

## 2019-12-08 DIAGNOSIS — C7951 Secondary malignant neoplasm of bone: Secondary | ICD-10-CM | POA: Diagnosis not present

## 2019-12-08 DIAGNOSIS — Z79811 Long term (current) use of aromatase inhibitors: Secondary | ICD-10-CM | POA: Diagnosis not present

## 2019-12-08 DIAGNOSIS — Z9849 Cataract extraction status, unspecified eye: Secondary | ICD-10-CM | POA: Diagnosis not present

## 2019-12-08 DIAGNOSIS — C50011 Malignant neoplasm of nipple and areola, right female breast: Secondary | ICD-10-CM | POA: Diagnosis not present

## 2019-12-08 DIAGNOSIS — M810 Age-related osteoporosis without current pathological fracture: Secondary | ICD-10-CM | POA: Diagnosis not present

## 2019-12-08 DIAGNOSIS — Z79899 Other long term (current) drug therapy: Secondary | ICD-10-CM | POA: Diagnosis not present

## 2019-12-08 DIAGNOSIS — W19XXXA Unspecified fall, initial encounter: Secondary | ICD-10-CM | POA: Diagnosis not present

## 2019-12-08 DIAGNOSIS — E1136 Type 2 diabetes mellitus with diabetic cataract: Secondary | ICD-10-CM | POA: Diagnosis not present

## 2019-12-08 DIAGNOSIS — E7800 Pure hypercholesterolemia, unspecified: Secondary | ICD-10-CM | POA: Diagnosis not present

## 2019-12-16 DIAGNOSIS — Z23 Encounter for immunization: Secondary | ICD-10-CM | POA: Diagnosis not present

## 2019-12-29 DIAGNOSIS — Z452 Encounter for adjustment and management of vascular access device: Secondary | ICD-10-CM | POA: Diagnosis not present

## 2019-12-30 DIAGNOSIS — E7849 Other hyperlipidemia: Secondary | ICD-10-CM | POA: Diagnosis not present

## 2019-12-30 DIAGNOSIS — E1142 Type 2 diabetes mellitus with diabetic polyneuropathy: Secondary | ICD-10-CM | POA: Diagnosis not present

## 2019-12-30 DIAGNOSIS — M545 Low back pain: Secondary | ICD-10-CM | POA: Diagnosis not present

## 2019-12-30 DIAGNOSIS — N1831 Chronic kidney disease, stage 3a: Secondary | ICD-10-CM | POA: Diagnosis not present

## 2019-12-30 DIAGNOSIS — I10 Essential (primary) hypertension: Secondary | ICD-10-CM | POA: Diagnosis not present

## 2020-01-13 DIAGNOSIS — Z23 Encounter for immunization: Secondary | ICD-10-CM | POA: Diagnosis not present

## 2020-02-09 DIAGNOSIS — Z452 Encounter for adjustment and management of vascular access device: Secondary | ICD-10-CM | POA: Diagnosis not present

## 2020-03-01 DIAGNOSIS — C50011 Malignant neoplasm of nipple and areola, right female breast: Secondary | ICD-10-CM | POA: Diagnosis not present

## 2020-03-01 DIAGNOSIS — C7951 Secondary malignant neoplasm of bone: Secondary | ICD-10-CM | POA: Diagnosis not present

## 2020-03-01 DIAGNOSIS — C50012 Malignant neoplasm of nipple and areola, left female breast: Secondary | ICD-10-CM | POA: Diagnosis not present

## 2020-03-01 DIAGNOSIS — M810 Age-related osteoporosis without current pathological fracture: Secondary | ICD-10-CM | POA: Diagnosis not present

## 2020-03-07 DIAGNOSIS — Z79811 Long term (current) use of aromatase inhibitors: Secondary | ICD-10-CM | POA: Diagnosis not present

## 2020-03-07 DIAGNOSIS — R590 Localized enlarged lymph nodes: Secondary | ICD-10-CM | POA: Diagnosis not present

## 2020-03-07 DIAGNOSIS — M810 Age-related osteoporosis without current pathological fracture: Secondary | ICD-10-CM | POA: Diagnosis not present

## 2020-03-07 DIAGNOSIS — C7951 Secondary malignant neoplasm of bone: Secondary | ICD-10-CM | POA: Diagnosis not present

## 2020-03-07 DIAGNOSIS — C50011 Malignant neoplasm of nipple and areola, right female breast: Secondary | ICD-10-CM | POA: Diagnosis not present

## 2020-03-07 DIAGNOSIS — C50012 Malignant neoplasm of nipple and areola, left female breast: Secondary | ICD-10-CM | POA: Diagnosis not present

## 2020-03-08 DIAGNOSIS — E7849 Other hyperlipidemia: Secondary | ICD-10-CM | POA: Diagnosis not present

## 2020-03-08 DIAGNOSIS — M545 Low back pain: Secondary | ICD-10-CM | POA: Diagnosis not present

## 2020-03-08 DIAGNOSIS — N1831 Chronic kidney disease, stage 3a: Secondary | ICD-10-CM | POA: Diagnosis not present

## 2020-03-08 DIAGNOSIS — I10 Essential (primary) hypertension: Secondary | ICD-10-CM | POA: Diagnosis not present

## 2020-03-08 DIAGNOSIS — E1142 Type 2 diabetes mellitus with diabetic polyneuropathy: Secondary | ICD-10-CM | POA: Diagnosis not present

## 2020-04-21 DIAGNOSIS — E119 Type 2 diabetes mellitus without complications: Secondary | ICD-10-CM | POA: Diagnosis not present

## 2020-04-21 DIAGNOSIS — I10 Essential (primary) hypertension: Secondary | ICD-10-CM | POA: Diagnosis not present

## 2020-05-04 DIAGNOSIS — Z452 Encounter for adjustment and management of vascular access device: Secondary | ICD-10-CM | POA: Diagnosis not present

## 2020-05-19 DIAGNOSIS — E119 Type 2 diabetes mellitus without complications: Secondary | ICD-10-CM | POA: Diagnosis not present

## 2020-05-19 DIAGNOSIS — I10 Essential (primary) hypertension: Secondary | ICD-10-CM | POA: Diagnosis not present

## 2020-06-14 DIAGNOSIS — E785 Hyperlipidemia, unspecified: Secondary | ICD-10-CM | POA: Diagnosis not present

## 2020-06-14 DIAGNOSIS — C50011 Malignant neoplasm of nipple and areola, right female breast: Secondary | ICD-10-CM | POA: Diagnosis not present

## 2020-06-14 DIAGNOSIS — C7951 Secondary malignant neoplasm of bone: Secondary | ICD-10-CM | POA: Diagnosis not present

## 2020-06-14 DIAGNOSIS — Z95828 Presence of other vascular implants and grafts: Secondary | ICD-10-CM | POA: Diagnosis not present

## 2020-06-14 DIAGNOSIS — Z79899 Other long term (current) drug therapy: Secondary | ICD-10-CM | POA: Diagnosis not present

## 2020-06-14 DIAGNOSIS — E559 Vitamin D deficiency, unspecified: Secondary | ICD-10-CM | POA: Diagnosis not present

## 2020-06-14 DIAGNOSIS — R590 Localized enlarged lymph nodes: Secondary | ICD-10-CM | POA: Diagnosis not present

## 2020-06-14 DIAGNOSIS — E538 Deficiency of other specified B group vitamins: Secondary | ICD-10-CM | POA: Diagnosis not present

## 2020-06-14 DIAGNOSIS — K6812 Psoas muscle abscess: Secondary | ICD-10-CM | POA: Diagnosis not present

## 2020-06-14 DIAGNOSIS — C50012 Malignant neoplasm of nipple and areola, left female breast: Secondary | ICD-10-CM | POA: Diagnosis not present

## 2020-06-14 DIAGNOSIS — Z79811 Long term (current) use of aromatase inhibitors: Secondary | ICD-10-CM | POA: Diagnosis not present

## 2020-06-14 DIAGNOSIS — M810 Age-related osteoporosis without current pathological fracture: Secondary | ICD-10-CM | POA: Diagnosis not present

## 2020-06-16 DIAGNOSIS — I10 Essential (primary) hypertension: Secondary | ICD-10-CM | POA: Diagnosis not present

## 2020-06-16 DIAGNOSIS — E119 Type 2 diabetes mellitus without complications: Secondary | ICD-10-CM | POA: Diagnosis not present

## 2020-07-04 DIAGNOSIS — E119 Type 2 diabetes mellitus without complications: Secondary | ICD-10-CM | POA: Diagnosis not present

## 2020-07-04 DIAGNOSIS — I10 Essential (primary) hypertension: Secondary | ICD-10-CM | POA: Diagnosis not present

## 2020-07-13 DIAGNOSIS — N1831 Chronic kidney disease, stage 3a: Secondary | ICD-10-CM | POA: Diagnosis not present

## 2020-07-13 DIAGNOSIS — Z23 Encounter for immunization: Secondary | ICD-10-CM | POA: Diagnosis not present

## 2020-07-13 DIAGNOSIS — E7849 Other hyperlipidemia: Secondary | ICD-10-CM | POA: Diagnosis not present

## 2020-07-13 DIAGNOSIS — E1142 Type 2 diabetes mellitus with diabetic polyneuropathy: Secondary | ICD-10-CM | POA: Diagnosis not present

## 2020-07-13 DIAGNOSIS — I10 Essential (primary) hypertension: Secondary | ICD-10-CM | POA: Diagnosis not present

## 2020-07-13 DIAGNOSIS — B3789 Other sites of candidiasis: Secondary | ICD-10-CM | POA: Diagnosis not present

## 2020-07-26 DIAGNOSIS — Z95828 Presence of other vascular implants and grafts: Secondary | ICD-10-CM | POA: Diagnosis not present

## 2020-08-12 DIAGNOSIS — E7849 Other hyperlipidemia: Secondary | ICD-10-CM | POA: Diagnosis not present

## 2020-08-12 DIAGNOSIS — I10 Essential (primary) hypertension: Secondary | ICD-10-CM | POA: Diagnosis not present

## 2020-08-12 DIAGNOSIS — N1831 Chronic kidney disease, stage 3a: Secondary | ICD-10-CM | POA: Diagnosis not present

## 2020-08-12 DIAGNOSIS — E1142 Type 2 diabetes mellitus with diabetic polyneuropathy: Secondary | ICD-10-CM | POA: Diagnosis not present

## 2020-08-23 DIAGNOSIS — Z23 Encounter for immunization: Secondary | ICD-10-CM | POA: Diagnosis not present

## 2020-08-31 DIAGNOSIS — N1831 Chronic kidney disease, stage 3a: Secondary | ICD-10-CM | POA: Diagnosis not present

## 2020-08-31 DIAGNOSIS — E1142 Type 2 diabetes mellitus with diabetic polyneuropathy: Secondary | ICD-10-CM | POA: Diagnosis not present

## 2020-08-31 DIAGNOSIS — I10 Essential (primary) hypertension: Secondary | ICD-10-CM | POA: Diagnosis not present

## 2020-08-31 DIAGNOSIS — E7849 Other hyperlipidemia: Secondary | ICD-10-CM | POA: Diagnosis not present

## 2020-09-01 DIAGNOSIS — E7800 Pure hypercholesterolemia, unspecified: Secondary | ICD-10-CM | POA: Diagnosis not present

## 2020-09-01 DIAGNOSIS — C50012 Malignant neoplasm of nipple and areola, left female breast: Secondary | ICD-10-CM | POA: Diagnosis not present

## 2020-09-01 DIAGNOSIS — M810 Age-related osteoporosis without current pathological fracture: Secondary | ICD-10-CM | POA: Diagnosis not present

## 2020-09-01 DIAGNOSIS — Z95828 Presence of other vascular implants and grafts: Secondary | ICD-10-CM | POA: Diagnosis not present

## 2020-09-01 DIAGNOSIS — D518 Other vitamin B12 deficiency anemias: Secondary | ICD-10-CM | POA: Diagnosis not present

## 2020-09-01 DIAGNOSIS — Z09 Encounter for follow-up examination after completed treatment for conditions other than malignant neoplasm: Secondary | ICD-10-CM | POA: Diagnosis not present

## 2020-09-01 DIAGNOSIS — C7951 Secondary malignant neoplasm of bone: Secondary | ICD-10-CM | POA: Diagnosis not present

## 2020-09-01 DIAGNOSIS — C50011 Malignant neoplasm of nipple and areola, right female breast: Secondary | ICD-10-CM | POA: Diagnosis not present

## 2020-09-01 DIAGNOSIS — E559 Vitamin D deficiency, unspecified: Secondary | ICD-10-CM | POA: Diagnosis not present

## 2020-09-01 DIAGNOSIS — E139 Other specified diabetes mellitus without complications: Secondary | ICD-10-CM | POA: Diagnosis not present

## 2020-09-05 DIAGNOSIS — Z170 Estrogen receptor positive status [ER+]: Secondary | ICD-10-CM | POA: Diagnosis not present

## 2020-09-05 DIAGNOSIS — C50919 Malignant neoplasm of unspecified site of unspecified female breast: Secondary | ICD-10-CM | POA: Diagnosis not present

## 2020-09-05 DIAGNOSIS — R591 Generalized enlarged lymph nodes: Secondary | ICD-10-CM | POA: Diagnosis not present

## 2020-09-05 DIAGNOSIS — E1136 Type 2 diabetes mellitus with diabetic cataract: Secondary | ICD-10-CM | POA: Diagnosis not present

## 2020-09-05 DIAGNOSIS — K529 Noninfective gastroenteritis and colitis, unspecified: Secondary | ICD-10-CM | POA: Diagnosis not present

## 2020-09-05 DIAGNOSIS — M546 Pain in thoracic spine: Secondary | ICD-10-CM | POA: Diagnosis not present

## 2020-09-05 DIAGNOSIS — D518 Other vitamin B12 deficiency anemias: Secondary | ICD-10-CM | POA: Diagnosis not present

## 2020-09-05 DIAGNOSIS — Z09 Encounter for follow-up examination after completed treatment for conditions other than malignant neoplasm: Secondary | ICD-10-CM | POA: Diagnosis not present

## 2020-09-05 DIAGNOSIS — E7800 Pure hypercholesterolemia, unspecified: Secondary | ICD-10-CM | POA: Diagnosis not present

## 2020-09-05 DIAGNOSIS — R54 Age-related physical debility: Secondary | ICD-10-CM | POA: Diagnosis not present

## 2020-09-05 DIAGNOSIS — C7951 Secondary malignant neoplasm of bone: Secondary | ICD-10-CM | POA: Diagnosis not present

## 2020-09-05 DIAGNOSIS — C50011 Malignant neoplasm of nipple and areola, right female breast: Secondary | ICD-10-CM | POA: Diagnosis not present

## 2020-09-05 DIAGNOSIS — E079 Disorder of thyroid, unspecified: Secondary | ICD-10-CM | POA: Diagnosis not present

## 2020-09-05 DIAGNOSIS — Z9013 Acquired absence of bilateral breasts and nipples: Secondary | ICD-10-CM | POA: Diagnosis not present

## 2020-09-05 DIAGNOSIS — E559 Vitamin D deficiency, unspecified: Secondary | ICD-10-CM | POA: Diagnosis not present

## 2020-09-05 DIAGNOSIS — Z79811 Long term (current) use of aromatase inhibitors: Secondary | ICD-10-CM | POA: Diagnosis not present

## 2020-09-05 DIAGNOSIS — I10 Essential (primary) hypertension: Secondary | ICD-10-CM | POA: Diagnosis not present

## 2020-09-05 DIAGNOSIS — Z95828 Presence of other vascular implants and grafts: Secondary | ICD-10-CM | POA: Diagnosis not present

## 2020-09-05 DIAGNOSIS — E139 Other specified diabetes mellitus without complications: Secondary | ICD-10-CM | POA: Diagnosis not present

## 2020-09-05 DIAGNOSIS — R590 Localized enlarged lymph nodes: Secondary | ICD-10-CM | POA: Diagnosis not present

## 2020-09-05 DIAGNOSIS — C50012 Malignant neoplasm of nipple and areola, left female breast: Secondary | ICD-10-CM | POA: Diagnosis not present

## 2020-09-05 DIAGNOSIS — Z9849 Cataract extraction status, unspecified eye: Secondary | ICD-10-CM | POA: Diagnosis not present

## 2020-09-05 DIAGNOSIS — M810 Age-related osteoporosis without current pathological fracture: Secondary | ICD-10-CM | POA: Diagnosis not present

## 2020-10-07 DIAGNOSIS — E1142 Type 2 diabetes mellitus with diabetic polyneuropathy: Secondary | ICD-10-CM | POA: Diagnosis not present

## 2020-10-07 DIAGNOSIS — E7849 Other hyperlipidemia: Secondary | ICD-10-CM | POA: Diagnosis not present

## 2020-10-07 DIAGNOSIS — E1121 Type 2 diabetes mellitus with diabetic nephropathy: Secondary | ICD-10-CM | POA: Diagnosis not present

## 2020-10-07 DIAGNOSIS — I10 Essential (primary) hypertension: Secondary | ICD-10-CM | POA: Diagnosis not present

## 2020-10-07 DIAGNOSIS — N1831 Chronic kidney disease, stage 3a: Secondary | ICD-10-CM | POA: Diagnosis not present

## 2020-10-14 DIAGNOSIS — Z452 Encounter for adjustment and management of vascular access device: Secondary | ICD-10-CM | POA: Diagnosis not present

## 2020-11-25 DIAGNOSIS — Z452 Encounter for adjustment and management of vascular access device: Secondary | ICD-10-CM | POA: Diagnosis not present

## 2020-12-01 DIAGNOSIS — E559 Vitamin D deficiency, unspecified: Secondary | ICD-10-CM | POA: Diagnosis not present

## 2020-12-01 DIAGNOSIS — M810 Age-related osteoporosis without current pathological fracture: Secondary | ICD-10-CM | POA: Diagnosis not present

## 2020-12-01 DIAGNOSIS — M8000XA Age-related osteoporosis with current pathological fracture, unspecified site, initial encounter for fracture: Secondary | ICD-10-CM | POA: Diagnosis not present

## 2020-12-01 DIAGNOSIS — C7951 Secondary malignant neoplasm of bone: Secondary | ICD-10-CM | POA: Diagnosis not present

## 2020-12-01 DIAGNOSIS — D518 Other vitamin B12 deficiency anemias: Secondary | ICD-10-CM | POA: Diagnosis not present

## 2020-12-01 DIAGNOSIS — C50011 Malignant neoplasm of nipple and areola, right female breast: Secondary | ICD-10-CM | POA: Diagnosis not present

## 2020-12-01 DIAGNOSIS — C50012 Malignant neoplasm of nipple and areola, left female breast: Secondary | ICD-10-CM | POA: Diagnosis not present

## 2020-12-05 DIAGNOSIS — R54 Age-related physical debility: Secondary | ICD-10-CM | POA: Diagnosis not present

## 2020-12-05 DIAGNOSIS — Z79811 Long term (current) use of aromatase inhibitors: Secondary | ICD-10-CM | POA: Diagnosis not present

## 2020-12-05 DIAGNOSIS — C50012 Malignant neoplasm of nipple and areola, left female breast: Secondary | ICD-10-CM | POA: Diagnosis not present

## 2020-12-05 DIAGNOSIS — C50011 Malignant neoplasm of nipple and areola, right female breast: Secondary | ICD-10-CM | POA: Diagnosis not present

## 2020-12-05 DIAGNOSIS — C7951 Secondary malignant neoplasm of bone: Secondary | ICD-10-CM | POA: Diagnosis not present

## 2020-12-05 DIAGNOSIS — R590 Localized enlarged lymph nodes: Secondary | ICD-10-CM | POA: Diagnosis not present

## 2021-01-06 DIAGNOSIS — Z452 Encounter for adjustment and management of vascular access device: Secondary | ICD-10-CM | POA: Diagnosis not present

## 2021-01-17 DIAGNOSIS — M5459 Other low back pain: Secondary | ICD-10-CM | POA: Diagnosis not present

## 2021-01-17 DIAGNOSIS — N1831 Chronic kidney disease, stage 3a: Secondary | ICD-10-CM | POA: Diagnosis not present

## 2021-01-17 DIAGNOSIS — E7849 Other hyperlipidemia: Secondary | ICD-10-CM | POA: Diagnosis not present

## 2021-01-17 DIAGNOSIS — Z1331 Encounter for screening for depression: Secondary | ICD-10-CM | POA: Diagnosis not present

## 2021-01-17 DIAGNOSIS — I10 Essential (primary) hypertension: Secondary | ICD-10-CM | POA: Diagnosis not present

## 2021-01-17 DIAGNOSIS — E1121 Type 2 diabetes mellitus with diabetic nephropathy: Secondary | ICD-10-CM | POA: Diagnosis not present

## 2021-01-17 DIAGNOSIS — Z0000 Encounter for general adult medical examination without abnormal findings: Secondary | ICD-10-CM | POA: Diagnosis not present

## 2021-01-17 DIAGNOSIS — E1142 Type 2 diabetes mellitus with diabetic polyneuropathy: Secondary | ICD-10-CM | POA: Diagnosis not present

## 2021-02-28 DIAGNOSIS — E139 Other specified diabetes mellitus without complications: Secondary | ICD-10-CM | POA: Diagnosis not present

## 2021-02-28 DIAGNOSIS — C7951 Secondary malignant neoplasm of bone: Secondary | ICD-10-CM | POA: Diagnosis not present

## 2021-02-28 DIAGNOSIS — E559 Vitamin D deficiency, unspecified: Secondary | ICD-10-CM | POA: Diagnosis not present

## 2021-02-28 DIAGNOSIS — C50012 Malignant neoplasm of nipple and areola, left female breast: Secondary | ICD-10-CM | POA: Diagnosis not present

## 2021-02-28 DIAGNOSIS — C50011 Malignant neoplasm of nipple and areola, right female breast: Secondary | ICD-10-CM | POA: Diagnosis not present

## 2021-02-28 DIAGNOSIS — M810 Age-related osteoporosis without current pathological fracture: Secondary | ICD-10-CM | POA: Diagnosis not present

## 2021-03-07 DIAGNOSIS — K529 Noninfective gastroenteritis and colitis, unspecified: Secondary | ICD-10-CM | POA: Diagnosis not present

## 2021-03-07 DIAGNOSIS — M546 Pain in thoracic spine: Secondary | ICD-10-CM | POA: Diagnosis not present

## 2021-03-07 DIAGNOSIS — Z79811 Long term (current) use of aromatase inhibitors: Secondary | ICD-10-CM | POA: Diagnosis not present

## 2021-03-07 DIAGNOSIS — K6812 Psoas muscle abscess: Secondary | ICD-10-CM | POA: Diagnosis not present

## 2021-03-07 DIAGNOSIS — I10 Essential (primary) hypertension: Secondary | ICD-10-CM | POA: Diagnosis not present

## 2021-03-07 DIAGNOSIS — D649 Anemia, unspecified: Secondary | ICD-10-CM | POA: Diagnosis not present

## 2021-03-07 DIAGNOSIS — E7800 Pure hypercholesterolemia, unspecified: Secondary | ICD-10-CM | POA: Diagnosis not present

## 2021-03-07 DIAGNOSIS — A419 Sepsis, unspecified organism: Secondary | ICD-10-CM | POA: Diagnosis not present

## 2021-03-07 DIAGNOSIS — E1165 Type 2 diabetes mellitus with hyperglycemia: Secondary | ICD-10-CM | POA: Diagnosis not present

## 2021-03-07 DIAGNOSIS — R591 Generalized enlarged lymph nodes: Secondary | ICD-10-CM | POA: Diagnosis not present

## 2021-03-07 DIAGNOSIS — Z7984 Long term (current) use of oral hypoglycemic drugs: Secondary | ICD-10-CM | POA: Diagnosis not present

## 2021-03-07 DIAGNOSIS — C50012 Malignant neoplasm of nipple and areola, left female breast: Secondary | ICD-10-CM | POA: Diagnosis not present

## 2021-03-07 DIAGNOSIS — E079 Disorder of thyroid, unspecified: Secondary | ICD-10-CM | POA: Diagnosis not present

## 2021-03-07 DIAGNOSIS — C50011 Malignant neoplasm of nipple and areola, right female breast: Secondary | ICD-10-CM | POA: Diagnosis not present

## 2021-03-07 DIAGNOSIS — M810 Age-related osteoporosis without current pathological fracture: Secondary | ICD-10-CM | POA: Diagnosis not present

## 2021-03-07 DIAGNOSIS — E1136 Type 2 diabetes mellitus with diabetic cataract: Secondary | ICD-10-CM | POA: Diagnosis not present

## 2021-03-07 DIAGNOSIS — Z170 Estrogen receptor positive status [ER+]: Secondary | ICD-10-CM | POA: Diagnosis not present

## 2021-04-05 ENCOUNTER — Emergency Department (HOSPITAL_COMMUNITY): Payer: Medicare Other

## 2021-04-05 ENCOUNTER — Inpatient Hospital Stay (HOSPITAL_COMMUNITY)
Admission: EM | Admit: 2021-04-05 | Discharge: 2021-04-10 | DRG: 683 | Disposition: A | Discharge status: Skilled Nursing Facility | Payer: Medicare Other | Attending: Internal Medicine | Admitting: Internal Medicine

## 2021-04-05 ENCOUNTER — Encounter (HOSPITAL_COMMUNITY): Payer: Self-pay

## 2021-04-05 DIAGNOSIS — Z20822 Contact with and (suspected) exposure to covid-19: Secondary | ICD-10-CM | POA: Diagnosis present

## 2021-04-05 DIAGNOSIS — E1122 Type 2 diabetes mellitus with diabetic chronic kidney disease: Secondary | ICD-10-CM

## 2021-04-05 DIAGNOSIS — Z66 Do not resuscitate: Secondary | ICD-10-CM | POA: Diagnosis not present

## 2021-04-05 DIAGNOSIS — T796XXA Traumatic ischemia of muscle, initial encounter: Secondary | ICD-10-CM

## 2021-04-05 DIAGNOSIS — C7949 Secondary malignant neoplasm of other parts of nervous system: Secondary | ICD-10-CM | POA: Diagnosis not present

## 2021-04-05 DIAGNOSIS — Z9010 Acquired absence of unspecified breast and nipple: Secondary | ICD-10-CM

## 2021-04-05 DIAGNOSIS — N179 Acute kidney failure, unspecified: Secondary | ICD-10-CM | POA: Diagnosis not present

## 2021-04-05 DIAGNOSIS — Z809 Family history of malignant neoplasm, unspecified: Secondary | ICD-10-CM

## 2021-04-05 DIAGNOSIS — E1121 Type 2 diabetes mellitus with diabetic nephropathy: Secondary | ICD-10-CM

## 2021-04-05 DIAGNOSIS — C7951 Secondary malignant neoplasm of bone: Secondary | ICD-10-CM | POA: Diagnosis present

## 2021-04-05 DIAGNOSIS — I10 Essential (primary) hypertension: Secondary | ICD-10-CM | POA: Diagnosis not present

## 2021-04-05 DIAGNOSIS — W19XXXA Unspecified fall, initial encounter: Secondary | ICD-10-CM | POA: Diagnosis present

## 2021-04-05 DIAGNOSIS — M6282 Rhabdomyolysis: Secondary | ICD-10-CM | POA: Diagnosis not present

## 2021-04-05 DIAGNOSIS — Z79899 Other long term (current) drug therapy: Secondary | ICD-10-CM

## 2021-04-05 DIAGNOSIS — E871 Hypo-osmolality and hyponatremia: Secondary | ICD-10-CM | POA: Diagnosis present

## 2021-04-05 DIAGNOSIS — R531 Weakness: Secondary | ICD-10-CM | POA: Diagnosis not present

## 2021-04-05 DIAGNOSIS — Z9221 Personal history of antineoplastic chemotherapy: Secondary | ICD-10-CM

## 2021-04-05 DIAGNOSIS — J9811 Atelectasis: Secondary | ICD-10-CM | POA: Diagnosis not present

## 2021-04-05 DIAGNOSIS — I471 Supraventricular tachycardia, unspecified: Secondary | ICD-10-CM

## 2021-04-05 DIAGNOSIS — Z923 Personal history of irradiation: Secondary | ICD-10-CM

## 2021-04-05 DIAGNOSIS — Z794 Long term (current) use of insulin: Secondary | ICD-10-CM

## 2021-04-05 DIAGNOSIS — R54 Age-related physical debility: Secondary | ICD-10-CM | POA: Diagnosis present

## 2021-04-05 DIAGNOSIS — Z79891 Long term (current) use of opiate analgesic: Secondary | ICD-10-CM

## 2021-04-05 DIAGNOSIS — R4182 Altered mental status, unspecified: Secondary | ICD-10-CM | POA: Diagnosis not present

## 2021-04-05 DIAGNOSIS — E785 Hyperlipidemia, unspecified: Secondary | ICD-10-CM | POA: Diagnosis present

## 2021-04-05 DIAGNOSIS — I491 Atrial premature depolarization: Secondary | ICD-10-CM | POA: Diagnosis not present

## 2021-04-05 DIAGNOSIS — K219 Gastro-esophageal reflux disease without esophagitis: Secondary | ICD-10-CM | POA: Diagnosis present

## 2021-04-05 DIAGNOSIS — I4891 Unspecified atrial fibrillation: Secondary | ICD-10-CM | POA: Diagnosis present

## 2021-04-05 DIAGNOSIS — R000 Tachycardia, unspecified: Secondary | ICD-10-CM | POA: Diagnosis not present

## 2021-04-05 DIAGNOSIS — S8011XA Contusion of right lower leg, initial encounter: Secondary | ICD-10-CM | POA: Diagnosis not present

## 2021-04-05 DIAGNOSIS — R410 Disorientation, unspecified: Secondary | ICD-10-CM | POA: Diagnosis not present

## 2021-04-05 DIAGNOSIS — R0902 Hypoxemia: Secondary | ICD-10-CM | POA: Diagnosis not present

## 2021-04-05 DIAGNOSIS — Z981 Arthrodesis status: Secondary | ICD-10-CM

## 2021-04-05 DIAGNOSIS — R5381 Other malaise: Secondary | ICD-10-CM

## 2021-04-05 DIAGNOSIS — E860 Dehydration: Secondary | ICD-10-CM | POA: Diagnosis present

## 2021-04-05 DIAGNOSIS — I129 Hypertensive chronic kidney disease with stage 1 through stage 4 chronic kidney disease, or unspecified chronic kidney disease: Secondary | ICD-10-CM | POA: Diagnosis present

## 2021-04-05 DIAGNOSIS — Z885 Allergy status to narcotic agent status: Secondary | ICD-10-CM

## 2021-04-05 DIAGNOSIS — N184 Chronic kidney disease, stage 4 (severe): Secondary | ICD-10-CM | POA: Diagnosis present

## 2021-04-05 DIAGNOSIS — Z79890 Hormone replacement therapy (postmenopausal): Secondary | ICD-10-CM

## 2021-04-05 DIAGNOSIS — Z853 Personal history of malignant neoplasm of breast: Secondary | ICD-10-CM

## 2021-04-05 DIAGNOSIS — E039 Hypothyroidism, unspecified: Secondary | ICD-10-CM | POA: Diagnosis present

## 2021-04-05 DIAGNOSIS — M7989 Other specified soft tissue disorders: Secondary | ICD-10-CM | POA: Diagnosis not present

## 2021-04-05 DIAGNOSIS — R0689 Other abnormalities of breathing: Secondary | ICD-10-CM | POA: Diagnosis not present

## 2021-04-05 LAB — URINALYSIS, ROUTINE W REFLEX MICROSCOPIC
Bilirubin Urine: NEGATIVE
Glucose, UA: NEGATIVE mg/dL
Ketones, ur: 20 mg/dL — AB
Nitrite: NEGATIVE
Protein, ur: 30 mg/dL — AB
Specific Gravity, Urine: 1.02 (ref 1.005–1.030)
pH: 5 (ref 5.0–8.0)

## 2021-04-05 LAB — CBC
HCT: 31.4 % — ABNORMAL LOW (ref 36.0–46.0)
Hemoglobin: 10.8 g/dL — ABNORMAL LOW (ref 12.0–15.0)
MCH: 33.8 pg (ref 26.0–34.0)
MCHC: 34.4 g/dL (ref 30.0–36.0)
MCV: 98.1 fL (ref 80.0–100.0)
Platelets: 218 10*3/uL (ref 150–400)
RBC: 3.2 MIL/uL — ABNORMAL LOW (ref 3.87–5.11)
RDW: 12.3 % (ref 11.5–15.5)
WBC: 7.4 10*3/uL (ref 4.0–10.5)
nRBC: 0 % (ref 0.0–0.2)

## 2021-04-05 LAB — PROTIME-INR
INR: 1.1 (ref 0.8–1.2)
Prothrombin Time: 13.7 seconds (ref 11.4–15.2)

## 2021-04-05 LAB — BASIC METABOLIC PANEL
Anion gap: 11 (ref 5–15)
BUN: 39 mg/dL — ABNORMAL HIGH (ref 8–23)
CO2: 25 mmol/L (ref 22–32)
Calcium: 9.3 mg/dL (ref 8.9–10.3)
Chloride: 96 mmol/L — ABNORMAL LOW (ref 98–111)
Creatinine, Ser: 1.32 mg/dL — ABNORMAL HIGH (ref 0.44–1.00)
GFR, Estimated: 39 mL/min — ABNORMAL LOW (ref >60)
Glucose, Bld: 160 mg/dL — ABNORMAL HIGH (ref 70–99)
Potassium: 4.1 mmol/L (ref 3.5–5.1)
Sodium: 132 mmol/L — ABNORMAL LOW (ref 135–145)

## 2021-04-05 LAB — RESP PANEL BY RT-PCR (FLU A&B, COVID) ARPGX2
Influenza A by PCR: NEGATIVE
Influenza B by PCR: NEGATIVE
SARS Coronavirus 2 by RT PCR: NEGATIVE

## 2021-04-05 LAB — APTT: aPTT: 31 seconds (ref 24–36)

## 2021-04-05 LAB — CK: Total CK: 1594 U/L — ABNORMAL HIGH (ref 38–234)

## 2021-04-05 LAB — CBG MONITORING, ED: Glucose-Capillary: 177 mg/dL — ABNORMAL HIGH (ref 70–99)

## 2021-04-05 LAB — TSH: TSH: 0.587 u[IU]/mL (ref 0.350–4.500)

## 2021-04-05 LAB — MAGNESIUM: Magnesium: 1.3 mg/dL — ABNORMAL LOW (ref 1.7–2.4)

## 2021-04-05 MED ORDER — HEPARIN SODIUM (PORCINE) 5000 UNIT/ML IJ SOLN
5000.0000 [IU] | Freq: Three times a day (TID) | INTRAMUSCULAR | Status: DC
  Administered 2021-04-06 – 2021-04-10 (×12): 5000 [IU] via SUBCUTANEOUS
  Filled 2021-04-05 (×12): qty 1

## 2021-04-05 MED ORDER — CYCLOBENZAPRINE HCL 10 MG PO TABS: 5.0000 mg | ORAL_TABLET | Freq: Three times a day (TID) | ORAL | Status: DC | PRN

## 2021-04-05 MED ORDER — SODIUM CHLORIDE 0.9 % IV BOLUS
500.0000 mL | Freq: Once | INTRAVENOUS | Status: AC
  Administered 2021-04-05: 500 mL via INTRAVENOUS

## 2021-04-05 MED ORDER — PANTOPRAZOLE SODIUM 40 MG PO TBEC
40.0000 mg | DELAYED_RELEASE_TABLET | Freq: Every day | ORAL | Status: DC
  Administered 2021-04-06 – 2021-04-10 (×5): 40 mg via ORAL
  Filled 2021-04-05 (×5): qty 1

## 2021-04-05 MED ORDER — ATORVASTATIN CALCIUM 10 MG PO TABS
10.0000 mg | ORAL_TABLET | Freq: Every day | ORAL | Status: DC
  Administered 2021-04-06 – 2021-04-10 (×5): 10 mg via ORAL
  Filled 2021-04-05 (×5): qty 1

## 2021-04-05 MED ORDER — ACETAMINOPHEN 650 MG RE SUPP: 650.0000 mg | Freq: Four times a day (QID) | RECTAL | Status: DC | PRN

## 2021-04-05 MED ORDER — MAGNESIUM SULFATE 2 GM/50ML IV SOLN: 2.0000 g | Freq: Once | INTRAVENOUS | Status: DC

## 2021-04-05 MED ORDER — ADULT MULTIVITAMIN W/MINERALS CH
1.0000 | ORAL_TABLET | Freq: Every day | ORAL | Status: DC
  Administered 2021-04-06 – 2021-04-10 (×6): 1 via ORAL
  Filled 2021-04-05 (×5): qty 1

## 2021-04-05 MED ORDER — MAGNESIUM SULFATE 4 GM/100ML IV SOLN
4.0000 g | Freq: Once | INTRAVENOUS | Status: AC
  Administered 2021-04-05: 4 g via INTRAVENOUS
  Filled 2021-04-05: qty 100

## 2021-04-05 MED ORDER — EXEMESTANE 25 MG PO TABS
25.0000 mg | ORAL_TABLET | Freq: Every day | ORAL | Status: DC
  Administered 2021-04-06 – 2021-04-10 (×5): 25 mg via ORAL
  Filled 2021-04-05 (×9): qty 1

## 2021-04-05 MED ORDER — SODIUM CHLORIDE 0.9 % IV SOLN: INTRAVENOUS | Status: DC

## 2021-04-05 MED ORDER — HYDRALAZINE HCL 20 MG/ML IJ SOLN: 5.0000 mg | Freq: Four times a day (QID) | INTRAMUSCULAR | Status: DC | PRN

## 2021-04-05 MED ORDER — ACETAMINOPHEN 325 MG PO TABS
650.0000 mg | ORAL_TABLET | Freq: Four times a day (QID) | ORAL | Status: DC | PRN
Start: 2021-04-05 — End: 2021-04-10

## 2021-04-05 MED ORDER — INSULIN ASPART 100 UNIT/ML IJ SOLN
0.0000 [IU] | Freq: Three times a day (TID) | INTRAMUSCULAR | Status: DC
  Administered 2021-04-06: 2 [IU] via SUBCUTANEOUS
  Administered 2021-04-06 – 2021-04-07 (×2): 5 [IU] via SUBCUTANEOUS
  Administered 2021-04-07 – 2021-04-09 (×4): 2 [IU] via SUBCUTANEOUS
  Administered 2021-04-10: 11 [IU] via SUBCUTANEOUS

## 2021-04-05 MED ORDER — LEVOTHYROXINE SODIUM 50 MCG PO TABS
50.0000 ug | ORAL_TABLET | Freq: Every day | ORAL | Status: DC
  Administered 2021-04-06 – 2021-04-10 (×5): 50 ug via ORAL
  Filled 2021-04-05 (×5): qty 1

## 2021-04-05 MED ORDER — LIDOCAINE 5 % EX PTCH
1.0000 | MEDICATED_PATCH | CUTANEOUS | Status: DC
  Administered 2021-04-05 – 2021-04-08 (×4): 1 via TRANSDERMAL
  Filled 2021-04-05 (×4): qty 1

## 2021-04-05 MED ORDER — SODIUM CHLORIDE 0.9 % IV BOLUS
1000.0000 mL | Freq: Once | INTRAVENOUS | Status: AC
  Administered 2021-04-05: 1000 mL via INTRAVENOUS

## 2021-04-05 MED ORDER — ACETAMINOPHEN 325 MG PO TABS: 650.0000 mg | ORAL_TABLET | Freq: Four times a day (QID) | ORAL | Status: DC | PRN

## 2021-04-05 MED ORDER — CALCIUM CARBONATE 1250 (500 CA) MG PO TABS
1250.0000 mg | ORAL_TABLET | Freq: Two times a day (BID) | ORAL | Status: DC
  Administered 2021-04-06 – 2021-04-10 (×9): 1250 mg via ORAL
  Filled 2021-04-05 (×12): qty 1

## 2021-04-05 NOTE — H&P (Signed)
TRH H&P   Patient Demographics:    Rita Thomas, is a 87 y.o. female  MRN: [id number]   DOB - 02-Sep-1932  Admit Date - 04/05/2021  Outpatient Primary MD for the patient is Pcp, No  Referring MD/NP/PA: Dr Sabra Heck  Patient coming from: Home  Chief Complaint  Patient presents with   Altered Mental Status      HPI:    Rita Thomas  is a 87 y.o. female, with medical history notable for cataracts, diabetes mellitus, increased cholesterol, hypertension, osteoporosis. Patient has a long standing history of Stage IV, hormone sensitive breast cancer, patient was brought by her daughter for altered mental status, and she was found on the floor, apparently patient had one of the shelves fell at her left leg yesterday when she was in the kitchen, and I am when she was going to bedside commode, apparently she fell, and remained on the floor overnight where her daughter found her and called EMS, as well daughter reports mother has been more confused than usual recently, but overall she has been eating or drinking good, patient denies any chest pain, nausea, vomiting, abdominal pain, fever or chills - in ED patient was noted with mild hyponatremia at 132, creatinine is higher than baseline at 1.32 (baseline 0.4), magnesium low at 1.3, total CK elevated at 1.594, hemoglobin at baseline of 10.8, urine analysis with 6 white blood cell, she denies any dysuria or polyuria, [facility] hospitalist consulted to admit.   Review of systems:    In addition to the HPI above,  No Fever-chills, she reports generalized weakness No Headache, No changes with Vision or hearing, No problems swallowing food or Liquids, No Chest pain, Cough or Shortness of Breath, No Abdominal pain, No Nausea or Vommitting, Bowel movements are regular, No Blood in stool or Urine, No dysuria, No new skin rashes or bruises, Complains of  chronic back pain, complains of right lower extremity pain as well. No new weakness, tingling, numbness in any extremity, No recent weight gain or loss, No polyuria, polydypsia or polyphagia, No significant Mental Stressors.  A full 10 point Review of Systems was done, except as stated above, all other Review of Systems were negative.   With Past History of the following :    Past Medical History:  Diagnosis Date   Breast cancer ([place]) 2006   Bilateral breast ca with mets to spine   Diabetes mellitus without complication (HCC)    GERD (gastroesophageal reflux disease)    H/O hiatal hernia    Hypertension    Metastasis to spinal cord ([place])    Metastatic cancer to spine ([place]) 03/13/2013   S/P chemotherapy, time since greater than 12 weeks    S/P radiation therapy       Past Surgical History:  Procedure Laterality Date   EYE SURGERY  1995   cataracts bilaterally   MASTECTOMY  2006  POSTERIOR LUMBAR FUSION 4 LEVEL N/A 03/06/2013   Procedure: POSTERIOR LUMBAR FUSION 4 LEVEL;  Surgeon: Winfield Cunas, MD;  Location: [facility];  Service: Neurosurgery;  Laterality: N/A;  T9-L3 posterior fusion with posterolateral arthrodesis and posterior segmental instrumentation      Social History:     Social History   Tobacco Use   Smoking status: Never   Smokeless tobacco: Never  Substance Use Topics   Alcohol use: No       Family History :     Family History  Problem Relation Age of Onset   Cancer Sister    Cancer Brother    Cancer Sister        breast     Home Medications:   Prior to Admission medications   Medication Sig Start Date End Date Taking? Authorizing Provider  acetaminophen (TYLENOL) 325 MG tablet Take 100 mg by mouth 2 (two) times daily.   Yes [provider]  atorvastatin (LIPITOR) 10 MG tablet Take 10 mg by mouth daily. 12/20/17  Yes [provider]  Calcium Carbonate (CALTRATE 600 PO) Take 1 tablet by mouth daily.   Yes [provider]  exemestane (AROMASIN) 25 MG tablet Take 25 mg by mouth daily. 02/24/21  Yes [provider]  levothyroxine (SYNTHROID) 50 MCG tablet Take 50 mcg by mouth daily. 02/24/21  Yes [provider]  lidocaine (LIDODERM) 5 % Place 1 patch onto the skin daily. Remove & Discard patch within 12 hours or as directed by MD   Yes [provider]  lisinopril (PRINIVIL,ZESTRIL) 20 MG tablet Take 20 mg by mouth daily.   Yes [provider]  Multiple Vitamin (MULTIVITAMIN WITH MINERALS) TABS Take 1 tablet by mouth daily.   Yes [provider]  pantoprazole (PROTONIX) 40 MG tablet Take 40 mg by mouth daily.   Yes [provider]  sitaGLIPtin (JANUVIA) 50 MG tablet Take 50 mg by mouth daily. 02/24/21  Yes [provider]  Calcium Carbonate-Vitamin D (CALTRATE 600+D PO) Take 1 tablet by mouth 2 (two) times daily. Patient not taking: No sig reported    [provider]  cholecalciferol (VITAMIN D) 1000 UNITS tablet Take 1,000 Units by mouth daily. Patient not taking: No sig reported    [provider]  cyclobenzaprine (FLEXERIL) 5 MG tablet Take 5 mg by mouth every 8 (eight) hours as needed for muscle spasms. Patient not taking: No sig reported    [provider]  fentaNYL (DURAGESIC - DOSED MCG/HR) 100 MCG/HR Place 1 patch onto the skin every 3 (three) days. Uses along with a 75 mcg patch to equal 175 mcg every 72 hours. Patient not taking: Reported on 04/05/2021    [provider]  fentaNYL (DURAGESIC - DOSED MCG/HR) 75 MCG/HR Place 1 patch onto the skin every 3 (three) days. Uses along with a 100 mcg patch to equal 175 mcg every 72 hours. Patient not taking: Reported on 04/05/2021    [provider]  insulin aspart (NOVOLOG) 100 UNIT/ML injection Inject 0-12 Units into the skin 3 (three) times daily with meals. Sliding scale Patient not taking: Reported on 04/05/2021    [provider]   morphine (MS CONTIN) 15 MG 12 hr tablet Take 15 mg by mouth every 12 (twelve) hours. Patient not taking: Reported on 04/05/2021    [provider]  oxyCODONE (ROXICODONE) 15 MG immediate release tablet Take 15 mg by mouth every 4 (four) hours as needed for pain.  Patient not taking: Reported on 04/05/2021    [provider]  vitamin C (ASCORBIC ACID) 500 MG tablet Take 500 mg by mouth daily. Patient not taking: Reported on 04/05/2021    [provider]  Zinc 50 MG CAPS Take 1 capsule by mouth daily. Patient not taking: Reported on 04/05/2021    [provider]     Allergies:     Allergies  Allergen Reactions   Morphine And Related Other (See Comments)    "Made me crazy"     Physical Exam:   Vitals  Blood pressure 135/72, pulse 83, temperature 98.4 F (36.9 C), temperature source Oral, resp. rate (!) 23, height 4\' 8"  (1.422 m), weight 59 kg, SpO2 94 %.   1. General frail elderly female, lying in bed, no apparent distress  2. Normal affect and insight, Not Suicidal or Homicidal, Awake Alert, Oriented X 3.  3. No F.N deficits, ALL C.Nerves Intact, Strength 5/5 all 4 extremities, Sensation intact all 4 extremities, Plantars down going.  4. Ears and Eyes appear Normal, Conjunctivae clear, PERRLA. Moist Oral Mucosa.  5. Supple Neck, No JVD, No cervical lymphadenopathy appriciated, No Carotid Bruits.  6. Symmetrical Chest wall movement, Good air movement bilaterally, CTAB.  Port-A-Cath in the right upper chest  7. RRR, No Gallops, Rubs or Murmurs, No Parasternal Heave.  8. Positive Bowel Sounds, Abdomen Soft, No tenderness, No organomegaly appriciated,No rebound -guarding or rigidity.  9.  No Cyanosis, Normal Skin Turgor, No Skin Rash or Bruise.  10. Good muscle tone,  joints appear normal , no effusions, Normal ROM.  Bruising right lower extremity, please see picture below.       Data Review:    CBC Recent Labs  Lab 04/05/21 1429  WBC  7.4  HGB 10.8*  HCT 31.4*  PLT 218  MCV 98.1  MCH 33.8  MCHC 34.4  RDW 12.3   ------------------------------------------------------------------------------------------------------------------  Chemistries  Recent Labs  Lab 04/05/21 1429  NA 132*  K 4.1  CL 96*  CO2 25  GLUCOSE 160*  BUN 39*  CREATININE 1.32*  CALCIUM 9.3  MG 1.3*   ------------------------------------------------------------------------------------------------------------------ estimated creatinine clearance is 21.5 mL/min (A) (by C-G formula based on SCr of 1.32 mg/dL (H)). ------------------------------------------------------------------------------------------------------------------ Recent Labs    04/05/21 1429  TSH 0.587    Coagulation profile Recent Labs  Lab 04/05/21 1429  INR 1.1   ------------------------------------------------------------------------------------------------------------------- No results for input(s): DDIMER in the last 72 hours. -------------------------------------------------------------------------------------------------------------------  Cardiac Enzymes No results for input(s): CKMB, TROPONINI, MYOGLOBIN in the last 168 hours.  Invalid input(s): CK ------------------------------------------------------------------------------------------------------------------ No results found for: BNP   ---------------------------------------------------------------------------------------------------------------  Urinalysis    Component Value Date/Time   COLORURINE YELLOW 04/05/2021 1448   APPEARANCEUR HAZY (A) 04/05/2021 1448   LABSPEC 1.020 04/05/2021 1448   PHURINE 5.0 04/05/2021 1448   GLUCOSEU NEGATIVE 04/05/2021 1448   HGBUR MODERATE (A) 04/05/2021 1448   BILIRUBINUR NEGATIVE 04/05/2021 1448   KETONESUR 20 (A) 04/05/2021 1448   PROTEINUR 30 (A) 04/05/2021 1448   NITRITE NEGATIVE 04/05/2021 1448   LEUKOCYTESUR SMALL (A) 04/05/2021 1448     ----------------------------------------------------------------------------------------------------------------   Imaging Results:    DG Chest 1 View  Result Date: 04/05/2021 CLINICAL DATA:  Fall today. EXAM: CHEST  1 VIEW COMPARISON:  Head CT 05/17/2015 FINDINGS: Right chest port with tip in the SVC. Lung volumes are low. Heart is normal in size. Suspected retrocardiac hiatal hernia. Streaky opacity at both lung bases likely atelectasis. No pneumothorax or pleural effusion.  Surgical hardware in the lower thoracic spine is partially included. No acute osseous abnormalities are seen. IMPRESSION: 1. Low lung volumes with bibasilar atelectasis. No traumatic findings. 2. Suspected retrocardiac hiatal hernia. 3. Right chest port with tip in the SVC. Electronically Signed   By: Keith Rake M.D.   On: 04/05/2021 16:53   DG Tibia/Fibula Right  Result Date: 04/05/2021 CLINICAL DATA:  Fall today.  Right leg bruising and swelling. EXAM: RIGHT TIBIA AND FIBULA - 2 VIEW COMPARISON:  None. FINDINGS: Cortical margins of the tibia and fibula are intact. There is no evidence of fracture or other focal bone lesions. Knee and ankle alignment are maintained. Generalized soft tissue edema. IMPRESSION: Generalized soft tissue edema. No fracture or acute osseous abnormality. Electronically Signed   By: Keith Rake M.D.   On: 04/05/2021 16:54   CT Head Wo Contrast  Result Date: 04/05/2021 CLINICAL DATA:  Confusion EXAM: CT HEAD WITHOUT CONTRAST TECHNIQUE: Contiguous axial images were obtained from the base of the skull through the vertex without intravenous contrast. COMPARISON:  CT brain report 08/12/2018 FINDINGS: Brain: No acute territorial infarction, hemorrhage, or intracranial mass. Moderate atrophy and chronic small vessel ischemic changes of the white matter. The ventricles are nonenlarged Vascular: No hyperdense vessels.  Carotid vascular calcification Skull: Normal. Negative for fracture or focal  lesion. Sinuses/Orbits: No acute finding. Other: None IMPRESSION: 1. No CT evidence for acute intracranial abnormality. 2. Atrophy and chronic small vessel ischemic change of the white matter Electronically Signed   By: Donavan Foil M.D.   On: 04/05/2021 16:04    My personal review of EKG: 112 BPM PR interval 162 ms QRS duration 78 ms QT/QTcB 338/434 ms P-R-T axes 0 -13 2 Unknown rhythm, irregular rate Abnormal R-wave progression, early transition Left ventricular hypertrophy Borderline T abnormalities, inferior leads No acute changes No old tracing to compare   Assessment & Plan:    Active Problems:   Metastatic cancer to spine (HCC)   Rhabdomyolysis  AKI/rhabdomyolysis -Baseline creatinine 0.4, currently is 1.32, total CK elevated at 1.594, she had prolonged period of being on the floor overnight, will avoid nephrotoxic medications including lisinopril, I will keep on IV fluids overnight, repeat BMP and total CK in a.m.  Diabetes mellitus -We will hold Januvia, and will keep on insulin sliding scale overnight  Metastatic breast cancer -[facility] with [facility], continue with Aromasin  Hypothyroidism -Continue with Synthroid  GERD -Continue with PPI  Hyponatremia -Due to volume depletion, should improve with IV fluids  Hypomagnesemia -Repleted, recheck in a.m.  Hyperlipidemia -Continue with Lipitor  Ambulatory dysfunction -She presents with fall, looks deconditioned and frail, will request PT/OT evaluation  DVT Prophylaxis Heparin   AM Labs Ordered, also please review Full Orders  Family Communication: Admission, patients condition and plan of care including tests being ordered have been discussed with the patient and daughter at bedside who indicate understanding and agree with the plan and Code Status.  Code Status DNR  Likely DC to  Home  Condition GUARDED    Consults called: None    Admission status: observation    Time spent in minutes : 65  minutes   Phillips Climes M.D on 04/05/2021 at 8:27 PM   [facility] - Office  [phone]

## 2021-04-05 NOTE — ED Notes (Signed)
Beeped to [phone].[place]

## 2021-04-05 NOTE — ED Provider Notes (Signed)
[facility] EMERGENCY DEPARTMENT Provider Note   CSN: [id number] Arrival date & time: 04/05/21  1347     History Chief Complaint  Patient presents with   Altered Mental Status    Rita Thomas is a 87 y.o. female.  HPI     87 year old female with history of diabetes, metastatic breast cancer comes in with chief complaint of altered mental status.  Level 5 caveat for confusion.  Patient is alert, but not providing clear history  Spoke with patient's daughter.  She reports that they check on the patient every day.  Patient lives by herself.  They saw her last night and she was fine.  They found her this morning on the floor.  Patient tells me that she was sitting on a cushion all night.  She does not remember falling.   Review of system is negative for any fevers, chills, cough.  Past Medical History:  Diagnosis Date   Breast cancer ([place]) 2006   Bilateral breast ca with mets to spine   Diabetes mellitus without complication (HCC)    GERD (gastroesophageal reflux disease)    H/O hiatal hernia    Hypertension    Metastasis to spinal cord [facility])    Metastatic cancer to spine ([place]) 03/13/2013   S/P chemotherapy, time since greater than 12 weeks    S/P radiation therapy     Patient Active Problem List   Diagnosis Date Noted   Metastatic cancer to spine ([place]) 03/13/2013    Past Surgical History:  Procedure Laterality Date   EYE SURGERY  1995   cataracts bilaterally   MASTECTOMY  2006   POSTERIOR LUMBAR FUSION 4 LEVEL N/A 03/06/2013   Procedure: POSTERIOR LUMBAR FUSION 4 LEVEL;  Surgeon: Winfield Cunas, MD;  Location: [facility];  Service: Neurosurgery;  Laterality: N/A;  T9-L3 posterior fusion with posterolateral arthrodesis and posterior segmental instrumentation     OB History   No obstetric history on file.     Family History  Problem Relation Age of Onset   Cancer Sister    Cancer Brother    Cancer Sister        breast    Social History   Tobacco Use    Smoking status: Never   Smokeless tobacco: Never  Substance Use Topics   Alcohol use: No   Drug use: No    Home Medications Prior to Admission medications   Medication Sig Start Date End Date Taking? Authorizing Provider  acetaminophen (TYLENOL) 325 MG tablet Take 650 mg by mouth every 6 (six) hours as needed for pain.    [provider]  Calcium Carbonate (CALTRATE 600 PO) Take by mouth.    [provider]  Calcium Carbonate-Vitamin D (CALTRATE 600+D PO) Take 1 tablet by mouth 2 (two) times daily.    [provider]  cholecalciferol (VITAMIN D) 1000 UNITS tablet Take 1,000 Units by mouth daily.    [provider]  cyclobenzaprine (FLEXERIL) 5 MG tablet Take 5 mg by mouth every 8 (eight) hours as needed for muscle spasms.    [provider]  fentaNYL (DURAGESIC - DOSED MCG/HR) 100 MCG/HR Place 1 patch onto the skin every 3 (three) days. Uses along with a 75 mcg patch to equal 175 mcg every 72 hours.    [provider]  fentaNYL (DURAGESIC - DOSED MCG/HR) 75 MCG/HR Place 1 patch onto the skin every 3 (three) days. Uses along with a 100 mcg patch to equal 175 mcg every 72  hours.    [provider]  insulin aspart (NOVOLOG) 100 UNIT/ML injection Inject 0-12 Units into the skin 3 (three) times daily with meals. Sliding scale    [provider]  lidocaine (LIDODERM) 5 % Place 1 patch onto the skin daily. Remove & Discard patch within 12 hours or as directed by MD    [provider]  lisinopril (PRINIVIL,ZESTRIL) 20 MG tablet Take 20 mg by mouth daily.    [provider]  morphine (MS CONTIN) 15 MG 12 hr tablet Take 15 mg by mouth every 12 (twelve) hours.    [provider]  Multiple Vitamin (MULTIVITAMIN WITH MINERALS) TABS Take 1 tablet by mouth daily.    [provider]  oxyCODONE (ROXICODONE) 15 MG immediate release tablet Take 15 mg by mouth every 4 (four) hours as needed for pain.     [provider]  pantoprazole (PROTONIX) 40 MG tablet Take 40 mg by mouth daily.    [provider]  vitamin C (ASCORBIC ACID) 500 MG tablet Take 500 mg by mouth daily.    [provider]  Zinc 50 MG CAPS Take 1 capsule by mouth daily.    [provider]    Allergies    Morphine and related  Review of Systems   Review of Systems  Unable to perform ROS: Mental status change  Constitutional:  Positive for activity change.  Hematological:  Bruises/bleeds easily.   Physical Exam Updated Vital Signs BP 127/71   Pulse 90   Temp 98.4 F (36.9 C) (Oral)   Resp (!) 23   Ht 4\' 8"  (1.422 m)   Wt 59 kg   SpO2 95%   BMI 29.15 kg/m   Physical Exam Vitals and nursing note reviewed.  Constitutional:      Appearance: She is well-developed.  HENT:     Head: Atraumatic.  Eyes:     Extraocular Movements: Extraocular movements intact.     Pupils: Pupils are equal, round, and reactive to light.  Cardiovascular:     Rate and Rhythm: Normal rate.  Pulmonary:     Effort: Pulmonary effort is normal.  Musculoskeletal:        General: Swelling and tenderness present.     Cervical back: Normal range of motion and neck supple.  Skin:    General: Skin is warm and dry.     Findings: Erythema and rash present.  Neurological:     Mental Status: She is alert. She is disoriented.    ED Results / Procedures / Treatments   Labs (all labs ordered are listed, but only abnormal results are displayed) Labs Reviewed  BASIC METABOLIC PANEL - Abnormal; Notable for the following components:      Result Value   Sodium 132 (*)    Chloride 96 (*)    Glucose, Bld 160 (*)    BUN 39 (*)    Creatinine, Ser 1.32 (*)    GFR, Estimated 39 (*)    All other components within normal limits  MAGNESIUM - Abnormal; Notable for the following components:   Magnesium 1.3 (*)    All other components within normal limits  CBC - Abnormal; Notable for the following components:    RBC 3.20 (*)    Hemoglobin 10.8 (*)    HCT 31.4 (*)    All other components within normal limits  CK - Abnormal; Notable for the following components:   Total CK 1,594 (*)    All other components within  normal limits  RESP PANEL BY RT-PCR (FLU A&B, COVID) ARPGX2  TSH  PROTIME-INR  APTT  URINALYSIS, ROUTINE W REFLEX MICROSCOPIC    EKG EKG Interpretation  Date/Time:  Wednesday April 05 2021 14:05:22 EDT Ventricular Rate:  112 PR Interval:  162 QRS Duration: 78 QT Interval:  338 QTC Calculation: 434 R Axis:   -13 Text Interpretation: Unknown rhythm, irregular rate Abnormal R-wave progression, early transition Left ventricular hypertrophy Borderline T abnormalities, inferior leads No acute changes No old tracing to compare Confirmed by Varney Biles [phone]) on 04/05/2021 2:13:32 PM  Radiology No results found.  Procedures Procedures   Medications Ordered in ED Medications  sodium chloride 0.9 % bolus 500 mL (has no administration in time range)    ED Course  I have reviewed the triage vital signs and the nursing notes.  Pertinent labs & imaging results that were available during my care of the patient were reviewed by me and considered in my medical decision making (see chart for details).    MDM Rules/Calculators/A&P                          87 year old comes in w/ AMS.  DDx includes: ICH / Stroke ACS Sepsis syndrome Infection - UTI/Pneumonia Encephalopathy  Electrolyte abnormality Drug overdose Cancer of unknown origin / paraneoplastic process  Patient found on the floor by family today.  CK ordered as we have concerns for possible rhabdo.  Patient has some altered mental status and hallucinations.  Likely delirium.  She has history of UTI and currently has metastatic cancer.  CT head ordered to ensure there is no brain bleed, brain mets that are new.  UA sent to see if there is any evidence of infection.  EKG showing atrial arrhythmia, it is possible that  patient had a syncopal episode that led to the fall as she is unclear why she was on the floor in first place.  Work-up is pending at the time of signout.  Anticipate admission as patient does have acute on chronic renal failure.  Final Clinical Impression(s) / ED Diagnoses Final diagnoses:  None    Rx / DC Orders ED Discharge Orders          Ordered    Amb referral to [facility]        04/05/21 [place], Lanyiah Brix, MD 04/11/21 1426

## 2021-04-05 NOTE — ED Triage Notes (Signed)
Pt BIB [facility] from home d/t family reports of pt being more confused. Per EMS, family concerned that pt is having hallucinations.

## 2021-04-05 NOTE — ED Provider Notes (Signed)
I have personally evaluated the patient, she does not fact have some renal insufficiency is likely secondary to rhabdomyolysis, magnesium is very low, she has been given medications, her heart rate is improved, blood pressure is normal, she does have some signs of rhabdomyolysis and thus will need to be admitted to the hospital.  Discussed with Dr. Waldron Labs who has been kind enough to admit.   Noemi Chapel, MD 04/05/21 [phone]

## 2021-04-05 NOTE — ED Provider Notes (Signed)
Change of shift, care accepted, patient has no CT scan evidence of head injury or stroke, COVID is negative, urinalysis without obvious infection however she does have some dehydration and is likely suffered an acute kidney injury from her fall, she does have some mild rhabdomyolysis with a CK of 1600.  Will admit to hospitalist, IV fluids are being given.   Noemi Chapel, MD 04/05/21 [place]

## 2021-04-05 NOTE — ED Notes (Signed)
Daughter at bedside, updated on POC. Hospitalist at bedside.

## 2021-04-05 NOTE — ED Notes (Signed)
Pt assisted to [facility]. Pt tolerated poorly. Pt is X 2 assist minimum to get OOB. Pt became tachycardic to 130s when OOB. Pt educated on need to stay in bed at this time. Pt verbalized understanding and has call light within reach. Bed in lowest position and locked with bed rails up X 2.

## 2021-04-06 ENCOUNTER — Other Ambulatory Visit: Payer: Self-pay

## 2021-04-06 ENCOUNTER — Encounter (HOSPITAL_COMMUNITY): Payer: Self-pay | Admitting: Internal Medicine

## 2021-04-06 DIAGNOSIS — M6282 Rhabdomyolysis: Secondary | ICD-10-CM | POA: Diagnosis not present

## 2021-04-06 DIAGNOSIS — E860 Dehydration: Secondary | ICD-10-CM | POA: Diagnosis not present

## 2021-04-06 DIAGNOSIS — R41841 Cognitive communication deficit: Secondary | ICD-10-CM | POA: Diagnosis not present

## 2021-04-06 DIAGNOSIS — E871 Hypo-osmolality and hyponatremia: Secondary | ICD-10-CM | POA: Diagnosis not present

## 2021-04-06 DIAGNOSIS — N179 Acute kidney failure, unspecified: Secondary | ICD-10-CM

## 2021-04-06 DIAGNOSIS — T796XXA Traumatic ischemia of muscle, initial encounter: Secondary | ICD-10-CM | POA: Diagnosis not present

## 2021-04-06 DIAGNOSIS — K219 Gastro-esophageal reflux disease without esophagitis: Secondary | ICD-10-CM | POA: Diagnosis present

## 2021-04-06 DIAGNOSIS — I471 Supraventricular tachycardia: Secondary | ICD-10-CM | POA: Diagnosis not present

## 2021-04-06 DIAGNOSIS — Z9221 Personal history of antineoplastic chemotherapy: Secondary | ICD-10-CM | POA: Diagnosis not present

## 2021-04-06 DIAGNOSIS — Z809 Family history of malignant neoplasm, unspecified: Secondary | ICD-10-CM | POA: Diagnosis not present

## 2021-04-06 DIAGNOSIS — N184 Chronic kidney disease, stage 4 (severe): Secondary | ICD-10-CM | POA: Diagnosis not present

## 2021-04-06 DIAGNOSIS — Z9010 Acquired absence of unspecified breast and nipple: Secondary | ICD-10-CM | POA: Diagnosis not present

## 2021-04-06 DIAGNOSIS — Z9181 History of falling: Secondary | ICD-10-CM | POA: Diagnosis not present

## 2021-04-06 DIAGNOSIS — Z20822 Contact with and (suspected) exposure to covid-19: Secondary | ICD-10-CM | POA: Diagnosis not present

## 2021-04-06 DIAGNOSIS — R2689 Other abnormalities of gait and mobility: Secondary | ICD-10-CM | POA: Diagnosis not present

## 2021-04-06 DIAGNOSIS — E039 Hypothyroidism, unspecified: Secondary | ICD-10-CM | POA: Diagnosis not present

## 2021-04-06 DIAGNOSIS — Z853 Personal history of malignant neoplasm of breast: Secondary | ICD-10-CM | POA: Diagnosis not present

## 2021-04-06 DIAGNOSIS — C7951 Secondary malignant neoplasm of bone: Secondary | ICD-10-CM | POA: Diagnosis not present

## 2021-04-06 DIAGNOSIS — Z981 Arthrodesis status: Secondary | ICD-10-CM | POA: Diagnosis not present

## 2021-04-06 DIAGNOSIS — I4891 Unspecified atrial fibrillation: Secondary | ICD-10-CM | POA: Diagnosis present

## 2021-04-06 DIAGNOSIS — I129 Hypertensive chronic kidney disease with stage 1 through stage 4 chronic kidney disease, or unspecified chronic kidney disease: Secondary | ICD-10-CM | POA: Diagnosis not present

## 2021-04-06 DIAGNOSIS — Z79891 Long term (current) use of opiate analgesic: Secondary | ICD-10-CM | POA: Diagnosis not present

## 2021-04-06 DIAGNOSIS — W19XXXA Unspecified fall, initial encounter: Secondary | ICD-10-CM | POA: Diagnosis present

## 2021-04-06 DIAGNOSIS — Z923 Personal history of irradiation: Secondary | ICD-10-CM | POA: Diagnosis not present

## 2021-04-06 DIAGNOSIS — R4182 Altered mental status, unspecified: Secondary | ICD-10-CM | POA: Diagnosis not present

## 2021-04-06 DIAGNOSIS — R5381 Other malaise: Secondary | ICD-10-CM | POA: Diagnosis not present

## 2021-04-06 DIAGNOSIS — Z66 Do not resuscitate: Secondary | ICD-10-CM | POA: Diagnosis not present

## 2021-04-06 DIAGNOSIS — M6281 Muscle weakness (generalized): Secondary | ICD-10-CM | POA: Diagnosis not present

## 2021-04-06 DIAGNOSIS — Z79899 Other long term (current) drug therapy: Secondary | ICD-10-CM | POA: Diagnosis not present

## 2021-04-06 DIAGNOSIS — E1122 Type 2 diabetes mellitus with diabetic chronic kidney disease: Secondary | ICD-10-CM | POA: Diagnosis not present

## 2021-04-06 DIAGNOSIS — Z885 Allergy status to narcotic agent status: Secondary | ICD-10-CM | POA: Diagnosis not present

## 2021-04-06 DIAGNOSIS — Z794 Long term (current) use of insulin: Secondary | ICD-10-CM | POA: Diagnosis not present

## 2021-04-06 DIAGNOSIS — E1121 Type 2 diabetes mellitus with diabetic nephropathy: Secondary | ICD-10-CM | POA: Diagnosis not present

## 2021-04-06 DIAGNOSIS — C7949 Secondary malignant neoplasm of other parts of nervous system: Secondary | ICD-10-CM | POA: Diagnosis not present

## 2021-04-06 LAB — HEMOGLOBIN A1C
Hgb A1c MFr Bld: 6.7 % — ABNORMAL HIGH (ref 4.8–5.6)
Mean Plasma Glucose: 145.59 mg/dL

## 2021-04-06 LAB — COMPREHENSIVE METABOLIC PANEL
ALT: 18 U/L (ref 0–44)
AST: 51 U/L — ABNORMAL HIGH (ref 15–41)
Albumin: 3.3 g/dL — ABNORMAL LOW (ref 3.5–5.0)
Alkaline Phosphatase: 37 U/L — ABNORMAL LOW (ref 38–126)
Anion gap: 2 — ABNORMAL LOW (ref 5–15)
BUN: 28 mg/dL — ABNORMAL HIGH (ref 8–23)
CO2: 30 mmol/L (ref 22–32)
Calcium: 7.8 mg/dL — ABNORMAL LOW (ref 8.9–10.3)
Chloride: 103 mmol/L (ref 98–111)
Creatinine, Ser: 1.04 mg/dL — ABNORMAL HIGH (ref 0.44–1.00)
GFR, Estimated: 52 mL/min — ABNORMAL LOW (ref >60)
Glucose, Bld: 115 mg/dL — ABNORMAL HIGH (ref 70–99)
Potassium: 4 mmol/L (ref 3.5–5.1)
Sodium: 135 mmol/L (ref 135–145)
Total Bilirubin: 0.6 mg/dL (ref 0.3–1.2)
Total Protein: 6 g/dL — ABNORMAL LOW (ref 6.5–8.1)

## 2021-04-06 LAB — GLUCOSE, CAPILLARY
Glucose-Capillary: 130 mg/dL — ABNORMAL HIGH (ref 70–99)
Glucose-Capillary: 223 mg/dL — ABNORMAL HIGH (ref 70–99)
Glucose-Capillary: 87 mg/dL (ref 70–99)
Glucose-Capillary: 143 mg/dL — ABNORMAL HIGH (ref 70–99)

## 2021-04-06 LAB — MAGNESIUM: Magnesium: 2.5 mg/dL — ABNORMAL HIGH (ref 1.7–2.4)

## 2021-04-06 LAB — CK: Total CK: 1187 U/L — ABNORMAL HIGH (ref 38–234)

## 2021-04-06 MED ORDER — CHLORHEXIDINE GLUCONATE CLOTH 2 % EX PADS
6.0000 | MEDICATED_PAD | Freq: Every day | CUTANEOUS | Status: DC
  Administered 2021-04-06 – 2021-04-10 (×5): 6 via TOPICAL

## 2021-04-06 NOTE — Evaluation (Signed)
Occupational Therapy Evaluation Patient Details Name: Rita Thomas MRN: [id number] DOB: 1932/02/27 Today's Date: 04/06/2021    History of Present Illness Rita Thomas  is a 87 y.o. female, with medical history notable for cataracts, diabetes mellitus, increased cholesterol, hypertension, osteoporosis. Patient has a long standing history of Stage IV, hormone sensitive breast cancer, patient was brought by her daughter for altered mental status, and she was found on the floor, apparently patient had one of the shelves fell at her left leg yesterday when she was in the kitchen, and I am when she was going to bedside commode, apparently she fell, and remained on the floor overnight where her daughter found her and called EMS, as well daughter reports mother has been more confused than usual recently, but overall she has been eating or drinking good, patient denies any chest pain, nausea, vomiting, abdominal pain, fever or chills   Clinical Impression   Pt agreeable to OT/PT co-evaluation. Pt demonstrates slow labored movement for bed mobility and transfers. Pt required min A for supine to sit and min A for stand pivot transfer with RW. Pt demonstrates  B UE shoulder limitations. L UE limitations reported from previous issues from breast cancer. Pt will benefit from continued OT in the hospital and recommended venue below to increase strength, ROM, balance, and endurance for safe ADL's.     Follow Up Recommendations  SNF;Supervision - Intermittent    Equipment Recommendations  None recommended by OT           Precautions / Restrictions Precautions Precautions: Fall Restrictions Weight Bearing Restrictions: No      Mobility Bed Mobility Overal bed mobility: Needs Assistance Bed Mobility: Supine to Sit (HOB slightly elevated)     Supine to sit: Min assist     General bed mobility comments: slow labored movement; assist to scoot forward to EOB    Transfers Overall transfer level: Needs  assistance Equipment used: Rolling walker (2 wheeled) Transfers: Sit to/from [facility] Sit to Stand: Min assist Stand pivot transfers: Min assist       General transfer comment: slow labored movement with increased time and verbal cues on hand placement    Balance Overall balance assessment: Needs assistance Sitting-balance support: Feet supported;Bilateral upper extremity supported Sitting balance-Leahy Scale: Fair Sitting balance - Comments: fair/poot with RW Postural control: Posterior lean Standing balance support: Bilateral upper extremity supported;During functional activity Standing balance-Leahy Scale: Fair Standing balance comment: with RW                           ADL either performed or assessed with clinical judgement   ADL Overall ADL's : Needs assistance/impaired       Grooming Details (indicate cue type and reason): Pt able to open deodorant container without assist.                 Toilet Transfer: Minimal assistance;Stand-pivot;RW Toilet Transfer Details (indicate cue type and reason): simulated via EOB to chair           General ADL Comments: Slow labored movement during mobility.     Vision Baseline Vision/History: Wears glasses Wears Glasses: Reading only Patient Visual Report: No change from baseline                  Pertinent Vitals/Pain Pain Assessment: No/denies pain     Hand Dominance Right   Extremity/Trunk Assessment Upper Extremity Assessment Upper Extremity Assessment: Defer to OT evaluation RUE Deficits /  Details: ~170* shoulder flexion A/ROM; 4+/5 elbow flexion and extension. RUE Coordination: WNL LUE Deficits / Details: ~145* A/ROM for shoulder flexion; 4+/5 elbow flexion; 4-/5 elbow extension. Pt reported limited L UE shoulder use since breast cancer procedures to remove lymph nodes.   Lower Extremity Assessment Lower Extremity Assessment: Defer to PT evaluation   Cervical / Trunk  Assessment Cervical / Trunk Assessment: Kyphotic   Communication Communication Communication: No difficulties   Cognition Arousal/Alertness: Awake/alert Behavior During Therapy: WFL for tasks assessed/performed Overall Cognitive Status: Within Functional Limits for tasks assessed                                                      Home Living Family/patient expects to be discharged to:: Private residence Living Arrangements: Alone Available Help at Discharge: Family;Available PRN/intermittently Type of Home: House Home Access: Ramped entrance     Home Layout: Two level Alternate Level Stairs-Number of Steps: pt unsure Alternate Level Stairs-Rails:  (pt unsure) Bathroom Shower/Tub: Teacher, early years/pre: Standard Bathroom Accessibility: Yes How Accessible: Accessible via walker Home Equipment: [place] - 2 wheels;Hospital bed;Grab bars - tub/shower;Shower seat - built in;Cane - single point          Prior Functioning/Environment Level of Independence: Needs assistance  Gait / Transfers Assistance Needed: Household ambulator with RW and cane when walking in community. ADL's / Homemaking Assistance Needed: Family assist with bathing; independent for dressing; family assists with IADL's   Comments: patient is a household ambulator with RW        OT Problem List: Decreased strength;Decreased range of motion;Decreased activity tolerance;Impaired balance (sitting and/or standing)      OT Treatment/Interventions: Self-care/ADL training;Therapeutic exercise;DME and/or AE instruction;Therapeutic activities;Patient/family education;Balance training    OT Goals(Current goals can be found in the care plan section) Acute Rehab OT Goals Patient Stated Goal: return home after rehab OT Goal Formulation: With patient Time For Goal Achievement: 04/20/21 Potential to Achieve Goals: Good  OT Frequency: Min 2X/week               Co-evaluation  PT/OT/SLP Co-Evaluation/Treatment: Yes Reason for Co-Treatment: To address functional/ADL transfers PT goals addressed during session: Mobility/safety with mobility OT goals addressed during session: ADL's and self-care;Strengthening/ROM      AM-PAC OT "6 Clicks" Daily Activity     Outcome Measure Help from another person eating meals?: None Help from another person taking care of personal grooming?: A Little Help from another person toileting, which includes using toliet, bedpan, or urinal?: A Little Help from another person bathing (including washing, rinsing, drying)?: A Little Help from another person to put on and taking off regular upper body clothing?: None Help from another person to put on and taking off regular lower body clothing?: A Little 6 Click Score: 20   End of Session Equipment Utilized During Treatment: Rolling walker  Activity Tolerance: Patient tolerated treatment well Patient left: in chair;with call bell/phone within reach  OT Visit Diagnosis: Unsteadiness on feet (R26.81);History of falling (Z91.81)                Time: 0998-3382 OT Time Calculation (min): 18 min Charges:  OT General Charges $OT Visit: 1 Visit OT Evaluation $OT Eval Low Complexity: [place] OT, MOT   Larey Seat 04/06/2021, 12:20 PM

## 2021-04-06 NOTE — Plan of Care (Signed)
  Problem: Education: Goal: Knowledge of General Education information will improve Description Including pain rating scale, medication(s)/side effects and non-pharmacologic comfort measures Outcome: Progressing   Problem: Health Behavior/Discharge Planning: Goal: Ability to manage health-related needs will improve Outcome: Progressing   

## 2021-04-06 NOTE — Plan of Care (Signed)
  Problem: Acute Rehab OT Goals (only OT should resolve) Goal: Pt. Will Perform Grooming Flowsheets (Taken 04/06/2021 1221) Pt Will Perform Grooming:  with modified independence  standing  with adaptive equipment Goal: Pt. Will Perform Lower Body Dressing Flowsheets (Taken 04/06/2021 1221) Pt Will Perform Lower Body Dressing:  with modified independence  sitting/lateral leans  with adaptive equipment  sit to/from stand Goal: Pt. Will Transfer To Toilet Flowsheets (Taken 04/06/2021 1221) Pt Will Transfer to Toilet:  with modified independence  stand pivot transfer  bedside commode Goal: Pt/Caregiver Will Perform Home Exercise Program Flowsheets (Taken 04/06/2021 1221) Pt/caregiver will Perform Home Exercise Program:  Increased ROM  Increased strength  Both right and left upper extremity  With Supervision  Kent [place] OT, MOT

## 2021-04-06 NOTE — Progress Notes (Signed)
Daughter at bedside, Clearance Coots, healthcare representative.

## 2021-04-06 NOTE — NC FL2 (Signed)
[place] MEDICAID FL2 LEVEL OF CARE SCREENING TOOL     IDENTIFICATION  Patient Name: Rita Thomas Birthdate: 1932/05/15 Sex: female Admission Date (Current Location): 04/05/2021  [facility] and [place] Number:  [employer] and Address:  [place] [address], [place]      Provider Number: [id number]  Attending Physician Name and Address:  Debbe Odea, MD  Relative Name and Phone Number:  Clearance Coots (Daughter)   [phone]    Current Level of Care: Hospital Recommended Level of Care: [place] Prior Approval Number:    Date Approved/Denied:   PASRR Number: [id number]  Discharge Plan: SNF    Current Diagnoses: Patient Active Problem List   Diagnosis Date Noted   Rhabdomyolysis 04/05/2021   Metastatic cancer to spine ([place]) 03/13/2013    Orientation RESPIRATION BLADDER Height & Weight     Self, Time, Situation, Place  Normal Continent, External catheter Weight: 130 lb (59 kg) Height:  4\' 8"  (142.2 cm)  BEHAVIORAL SYMPTOMS/MOOD NEUROLOGICAL BOWEL NUTRITION STATUS      Continent Diet (Regular)  AMBULATORY STATUS COMMUNICATION OF NEEDS Skin   Limited Assist Verbally Normal                       Personal Care Assistance Level of Assistance  Bathing, Feeding, Dressing, Total care Bathing Assistance: Limited assistance Feeding assistance: Independent Dressing Assistance: Limited assistance Total Care Assistance: Limited assistance   Functional Limitations Info  Sight, Hearing, Speech Sight Info: Adequate Hearing Info: Adequate Speech Info: Adequate    SPECIAL CARE FACTORS FREQUENCY  PT (By licensed PT), OT (By licensed OT)     PT Frequency: 5 times weekly OT Frequency: 5 times weekly            Contractures Contractures Info: Not present    Additional Factors Info  Code Status, Allergies Code Status Info: DNR Allergies Info: Morphine and related           Current Medications  (04/06/2021):  This is the current hospital active medication list Current Facility-Administered Medications  Medication Dose Route Frequency Provider Last Rate Last Admin   0.9 %  sodium chloride infusion   Intravenous Continuous Elgergawy, Silver Huguenin, MD 75 mL/hr at 04/06/21 0516 Rate Verify at 04/06/21 0516   acetaminophen (TYLENOL) tablet 650 mg  650 mg Oral Q6H PRN Elgergawy, Silver Huguenin, MD       Or   acetaminophen (TYLENOL) suppository 650 mg  650 mg Rectal Q6H PRN Elgergawy, Silver Huguenin, MD       atorvastatin (LIPITOR) tablet 10 mg  10 mg Oral Daily Elgergawy, Silver Huguenin, MD   10 mg at 04/06/21 1019   calcium carbonate (OS-CAL - dosed in mg of elemental calcium) tablet 1,250 mg  1,250 mg Oral BID WC Elgergawy, Silver Huguenin, MD   1,250 mg at 04/06/21 1019   Chlorhexidine Gluconate Cloth 2 % PADS 6 each  6 each Topical Daily Debbe Odea, MD   6 each at 04/06/21 1020   cyclobenzaprine (FLEXERIL) tablet 5 mg  5 mg Oral Q8H PRN Elgergawy, Silver Huguenin, MD       exemestane (AROMASIN) tablet 25 mg  25 mg Oral Daily Elgergawy, Silver Huguenin, MD   25 mg at 04/06/21 1153   heparin injection 5,000 Units  5,000 Units Subcutaneous Q8H Elgergawy, Silver Huguenin, MD       hydrALAZINE (APRESOLINE) injection 5 mg  5 mg Intravenous Q6H PRN Elgergawy, Silver Huguenin, MD  insulin aspart (novoLOG) injection 0-15 Units  0-15 Units Subcutaneous TID WC Elgergawy, Silver Huguenin, MD   5 Units at 04/06/21 1153   levothyroxine (SYNTHROID) tablet 50 mcg  50 mcg Oral Daily Elgergawy, Silver Huguenin, MD   50 mcg at 04/06/21 0634   lidocaine (LIDODERM) 5 % 1 patch  1 patch Transdermal Q24H Elgergawy, Silver Huguenin, MD   1 patch at 04/05/21 2341   multivitamin with minerals tablet 1 tablet  1 tablet Oral Daily Elgergawy, Silver Huguenin, MD   1 tablet at 04/06/21 1019   pantoprazole (PROTONIX) EC tablet 40 mg  40 mg Oral Daily Elgergawy, Silver Huguenin, MD   40 mg at 04/06/21 1019     Discharge Medications: Please see discharge summary for a list of discharge  medications.  Relevant Imaging Results:  Relevant Lab Results:   Additional Information SSN: [id number] [place]

## 2021-04-06 NOTE — Evaluation (Addendum)
Physical Therapy Evaluation Patient Details Name: Rita Thomas MRN: [id number] DOB: [birth year] Today's Date: 04/06/2021   History of Present Illness  Glasner  is a 87 y.o. female, with medical history notable for cataracts, diabetes mellitus, increased cholesterol, hypertension, osteoporosis. Patient has a long standing history of Stage IV, hormone sensitive breast cancer, patient was brought by her daughter for altered mental status, and she was found on the floor, apparently patient had one of the shelves fell at her left leg yesterday when she was in the kitchen, and I am when she was going to bedside commode, apparently she fell, and remained on the floor overnight where her daughter found her and called EMS, as well daughter reports mother has been more confused than usual recently, but overall she has been eating or drinking good, patient denies any chest pain, nausea, vomiting, abdominal pain, fever or chills   Clinical Impression  Patient presents in bed and agreeable to therapy. Patient require min/mod assist for bed mobility and transfer to a sitting position at the EOB. Patient demonstrated fair seated balance and EOB, but requires min assist for sit to stand using RW. Patient was able to hold standing balance with RW for 3 minutes. Patient was limited to 5 feet of ambulation at the bed side due to reports of unsteadiness and shuffling like gait pattern. Patient was transferred to the chair - nursing staff notified. Patient will benefit from continued physical therapy in hospital and recommended venue below to increase strength, balance, endurance for safe ADLs and gait.     Follow Up Recommendations SNF    Equipment Recommendations  None recommended by PT    Recommendations for Other Services       Precautions / Restrictions Precautions Precautions: Fall Restrictions Weight Bearing Restrictions: No      Mobility  Bed Mobility Overal bed mobility: Needs Assistance Bed  Mobility: Supine to Sit     Supine to sit: Min assist     General bed mobility comments: slow labored movement; assist to scoot forward to EOB Patient Response: Cooperative  Transfers Overall transfer level: Needs assistance Equipment used: Rolling walker (2 wheeled) Transfers: Sit to/from [facility] Sit to Stand: Min assist Stand pivot transfers: Min assist       General transfer comment: slow labored movement with increased time and verbal cues on hand placement  Ambulation/Gait Ambulation/Gait assistance: Min assist Gait Distance (Feet): 5 Feet Assistive device: Rolling walker (2 wheeled) Gait Pattern/deviations: Decreased step length - right;Decreased step length - left;Decreased stride length;Narrow base of support;Trunk flexed Gait velocity: decreased   General Gait Details: slow labored movement, increase time, using RW, limited to a few short steps at the bed side.  Stairs            Wheelchair Mobility    Modified Rankin (Stroke Patients Only)       Balance Overall balance assessment: Needs assistance Sitting-balance support: Feet supported;Bilateral upper extremity supported Sitting balance-Leahy Scale: Fair Sitting balance - Comments: fair/poot with RW Postural control: Posterior lean Standing balance support: Bilateral upper extremity supported;During functional activity Standing balance-Leahy Scale: Fair Standing balance comment: with RW                             Pertinent Vitals/Pain Pain Assessment: No/denies pain    Home Living Family/patient expects to be discharged to:: Private residence Living Arrangements: Alone Available Help at Discharge: Family;Available PRN/intermittently Type of Home: House Home Access:  Ramped entrance     Home Layout: Two level;Able to live on main level with bedroom/bathroom Home Equipment: Gilford Rile - 2 wheels;Hospital bed;Grab bars - tub/shower;Shower seat - built in;Cane -  single point      Prior Function Level of Independence: Needs assistance   Gait / Transfers Assistance Needed: Household ambulator with RW and cane when walking in community.  ADL's / Homemaking Assistance Needed: Family assist with bathing; independent for dressing; family assists with IADL's  Comments: patient is a household ambulator with RW     Hand Dominance   Dominant Hand: Right    Extremity/Trunk Assessment   Upper Extremity Assessment Upper Extremity Assessment: Defer to OT evaluation RUE Deficits / Details: ~170* shoulder flexion A/ROM; 4+/5 elbow flexion and extension. RUE Coordination: WNL LUE Deficits / Details: ~145* A/ROM for shoulder flexion; 4+/5 elbow flexion; 4-/5 elbow extension. Pt reported limited L UE shoulder use since breast cancer procedures to remove lymph nodes.    Lower Extremity Assessment Lower Extremity Assessment: Generalized weakness    Cervical / Trunk Assessment Cervical / Trunk Assessment: Kyphotic  Communication   Communication: No difficulties  Cognition Arousal/Alertness: Awake/alert Behavior During Therapy: WFL for tasks assessed/performed Overall Cognitive Status: Within Functional Limits for tasks assessed                                        General Comments      Exercises     Assessment/Plan    PT Assessment Patient needs continued PT services  PT Problem List Decreased strength;Decreased range of motion;Decreased activity tolerance;Decreased balance;Decreased mobility;Decreased coordination       PT Treatment Interventions DME instruction;Gait training;Stair training;Functional mobility training;Therapeutic activities;Therapeutic exercise;Balance training;Patient/family education    PT Goals (Current goals can be found in the Care Plan section)  Acute Rehab PT Goals Patient Stated Goal: return home after rehab PT Goal Formulation: With patient Time For Goal Achievement: 04/20/21 Potential to  Achieve Goals: Good    Frequency Min 3X/week   Barriers to discharge        Co-evaluation PT/OT/SLP Co-Evaluation/Treatment: Yes Reason for Co-Treatment: To address functional/ADL transfers PT goals addressed during session: Mobility/safety with mobility OT goals addressed during session: ADL's and self-care;Strengthening/ROM       AM-PAC PT "6 Clicks" Mobility  Outcome Measure Help needed turning from your back to your side while in a flat bed without using bedrails?: A Little Help needed moving from lying on your back to sitting on the side of a flat bed without using bedrails?: A Little Help needed moving to and from a bed to a chair (including a wheelchair)?: A Little Help needed standing up from a chair using your arms (e.g., wheelchair or bedside chair)?: A Little Help needed to walk in hospital room?: A Lot Help needed climbing 3-5 steps with a railing? : A Lot 6 Click Score: 16    End of Session   Activity Tolerance: Patient tolerated treatment well;Patient limited by fatigue Patient left: in chair;with call bell/phone within reach;with chair alarm set Nurse Communication: Mobility status PT Visit Diagnosis: Unsteadiness on feet (R26.81);Muscle weakness (generalized) (M62.81);Other abnormalities of gait and mobility (R26.89)    Time: 3235-5732 PT Time Calculation (min) (ACUTE ONLY): 25 min   Charges:   PT Evaluation $PT Eval Moderate Complexity: 1 Mod PT Treatments $Therapeutic Activity: 23-37 mins      12:14 PM, 04/06/21 Jeneen Rinks Cousler SPT  12:14 PM, 04/06/21 Lonell Grandchild, MPT Physical Therapist with [facility] [place] [place] [phone] office [phone] mobile phone

## 2021-04-06 NOTE — Care Management Obs Status (Signed)
[place] NOTIFICATION   Patient Details  Name: Rita Thomas MRN: [id number] Date of Birth: 08-Jun-1932   Medicare Observation Status Notification Given:  Yes    Tommy Medal 04/06/2021, 4:06 PM

## 2021-04-06 NOTE — Plan of Care (Addendum)
  Problem: Acute Rehab PT Goals(only PT should resolve) Goal: Pt Will Go Supine/Side To Sit Outcome: Progressing Flowsheets (Taken 04/06/2021 1209) Pt will go Supine/Side to Sit: with modified independence Goal: Patient Will Transfer Sit To/From Stand Outcome: Progressing Flowsheets (Taken 04/06/2021 1209) Patient will transfer sit to/from stand:  with supervision  with min guard assist Note: Using RW Goal: Pt Will Transfer Bed To Chair/Chair To Bed Outcome: Progressing Flowsheets (Taken 04/06/2021 1209) Pt will Transfer Bed to Chair/Chair to Bed: with supervision Note: Using RW Goal: Pt Will Ambulate Outcome: Progressing Flowsheets (Taken 04/06/2021 1209) Pt will Ambulate:  25 feet  with min guard assist  with rolling walker    12:10 PM, 04/06/21 Jeneen Rinks Cousler SPT  12:12 PM, 04/06/21 Lonell Grandchild, MPT Physical Therapist with [facility] 336 [phone] office [phone] mobile phone

## 2021-04-06 NOTE — Plan of Care (Signed)
Patient care planning begun. Patient has family support, updated them on conditions and plan of care.

## 2021-04-06 NOTE — TOC Initial Note (Signed)
Transition of Care Rita Thomas) - Initial/Assessment Note    Patient Details  Name: Rita Thomas MRN: [id number] Date of Birth: 23-Nov-1931  Transition of Care Rita Thomas) CM/SW Contact:    Rita Thomas, Rita Thomas Phone Number: 04/06/2021, 11:52 AM  Clinical Narrative:                 CSW spoke to pt and her daughter Ms. Rita Thomas in room to complete assessment. PT is recommending SNF for pt. Pt lives alone and is independent in completing her ADLs but her family does come in daily to assist with getting groceries, cooking, and laundry. Pt does not drive and her daughter takes her to all her appointments. Pt has Rita Thomas in the past but it was many years ago. Pt has a cane, walker, rollator, and shower chair. Pts daughter is requesting a wheelchair at d/c, CSW to work on referral, they do not have a preference in [place]. CSW spoke about interest in SNF, pt is agreeable to a referral being sent off though she is still deciding. Pt would like to go to Rita Thomas. TOC to send out pts referral.   Expected Discharge Plan: Skilled Nursing Facility Barriers to Discharge: Continued Medical Work up   Patient Goals and CMS Choice Patient states their goals for this hospitalization and ongoing recovery are:: Go to SNF CMS Medicare.gov Compare Post Acute Care list provided to:: Patient Choice offered to / list presented to : Patient, Adult Children  Expected Discharge Plan and Services Expected Discharge Plan: Rita Thomas In-house Referral: Clinical Social Work Discharge Planning Services: CM Consult Post Acute Care Choice: Rita Thomas Living arrangements for the past 2 months: Rita Thomas                                      Prior Living Arrangements/Services Living arrangements for the past 2 months: Single Family Home Lives with:: Self Patient language and need for interpreter reviewed:: Yes Do you feel safe going back to the place where you live?: Yes      Need for Family  Participation in Patient Care: Yes (Comment) Care giver support system in place?: Yes (comment) Current home services: DME Criminal Activity/Legal Involvement Pertinent to Current Situation/Hospitalization: No - Comment as needed  Activities of Daily Living Home Assistive Devices/Equipment: Gilford Rile (specify type) ADL Screening (condition at time of admission) Patient's cognitive ability adequate to safely complete daily activities?: Yes Is the patient deaf or have difficulty hearing?: No Does the patient have difficulty seeing, even when wearing glasses/contacts?: No Does the patient have difficulty concentrating, remembering, or making decisions?: No Patient able to express need for assistance with ADLs?: Yes Does the patient have difficulty dressing or bathing?: No Independently performs ADLs?: Yes (appropriate for developmental age) Does the patient have difficulty walking or climbing stairs?: No Weakness of Legs: Both Weakness of Arms/Hands: Both  Permission Sought/Granted                  Emotional Assessment Appearance:: Appears stated age Attitude/Demeanor/Rapport: Engaged Affect (typically observed): Accepting Orientation: : Oriented to Self, Oriented to Place, Oriented to  Time, Oriented to Situation Alcohol / Substance Use: Not Applicable Psych Involvement: No (comment)  Admission diagnosis:  Rhabdomyolysis [M62.82] Hypomagnesemia [E83.42] Acute kidney injury ([place]) [N17.9] Traumatic rhabdomyolysis, initial encounter ([place]) [T79.6XXA] Patient Active Problem List   Diagnosis Date Noted   Rhabdomyolysis 04/05/2021   Metastatic cancer to  spine ([place]) 03/13/2013   PCP:  Rita Thomas, No Pharmacy:   [facility] [place] [address] [place] [address] Phone: [phone] Fax: [phone]     Social Determinants of Health (SDOH) Interventions    Readmission Risk Interventions No flowsheet data found.

## 2021-04-06 NOTE — Progress Notes (Addendum)
PROGRESS NOTE    Rita Thomas   TKZ:[id number]  DOB: 06-12-1932  DOA: 04/05/2021 PCP: Pcp, No   Brief Narrative:  Rita Thomas is an 87 year old female with diabetes mellitus, hypertension, hyperlipidemia, stage IV breast cancer status post chemoradiation with metastasis to the spine is brought to the ED by EMS with complaints of altered mental status.  According to the patient's daughter who was in the room, the patient appeared to have slid off of the commode and was lying on the floor and stool and urine.  Apparently she remained there overnight and her daughter found her in the morning. In the ED she was noted to have a sodium of 132 and the creatinine that was 1.32 (above from 0.4 in 2014).  Total CK was 1594.   Subjective: The patient complains of feeling weak.  She is confused to place and time.    Assessment & Plan:   Principal Problem: Hyponatremia/AKI (acute kidney injury)/rhabdomyolysis -Holding lisinopril -BUN/creatinine ratio is elevated greater than 20 signifying dehydration - Continue IV fluids and follow sodium, creatinine and CK all of which are improving  Active Problems:  Mildly elevated LFTs - Possibly related to rhabdomyolysis  Breast cancer with metastasis to the spine - Status post chemo, radiation, laminectomy for spinal cord compression at T11-T12 -Continue Aromasin  Hypothyroidism - Continue Synthroid  Diabetes mellitus -A1c 6.7 - Holding Januvia  Mild hematoma on right leg likely due to fall - Follow  Time spent in minutes: 25 DVT prophylaxis: heparin injection 5,000 Units Start: 04/06/21 2200   Code Status: DNR Family Communication: Daughter at bedside Level of Care: Level of care: Telemetry Disposition Plan:  Status is: Observation  The patient will require care spanning > 2 midnights and should be moved to inpatient because: IV treatments appropriate due to intensity of illness or inability to take PO  Dispo: The patient is  from: Home              Anticipated d/c is to: Home              Patient currently is not medically stable to d/c.   Difficult to place patient No      Consultants:  None Procedures:  None Antimicrobials:  Anti-infectives (From admission, onward)    None        Objective: Vitals:   04/06/21 0530 04/06/21 0600 04/06/21 0800 04/06/21 1401  BP: 104/61 120/79 129/66 (!) 141/79  Pulse: 71 71 79 96  Resp: 20 20 17 18   Temp:   98.5 F (36.9 C) 98.3 F (36.8 C)  TempSrc:   Oral Oral  SpO2: 94% 97% 98% 97%  Weight:      Height:        Intake/Output Summary (Last 24 hours) at 04/06/2021 1740 Last data filed at 04/06/2021 1600 Gross per 24 hour  Intake 1509.36 ml  Output --  Net 1509.36 ml   Filed Weights   04/05/21 1359  Weight: 59 kg    Examination: General exam: Appears comfortable  HEENT: PERRLA, oral mucosa moist, no sclera icterus or thrush Respiratory system: Clear to auscultation. Respiratory effort normal. Cardiovascular system: S1 & S2 heard, RRR.   Gastrointestinal system: Abdomen soft, non-tender, nondistended. Normal bowel sounds. Central nervous system: Alert and oriented to person-no focal neurological deficits. Extremities: No cyanosis, clubbing or edema Skin: No rashes or ulcers Psychiatry:  Mood & affect appropriate.      Data Reviewed: I have personally reviewed following labs and imaging studies  CBC: Recent Labs  Lab 04/05/21 1429  WBC 7.4  HGB 10.8*  HCT 31.4*  MCV 98.1  PLT 962   Basic Metabolic Panel: Recent Labs  Lab 04/05/21 1429 04/06/21 0425  NA 132* 135  K 4.1 4.0  CL 96* 103  CO2 25 30  GLUCOSE 160* 115*  BUN 39* 28*  CREATININE 1.32* 1.04*  CALCIUM 9.3 7.8*  MG 1.3* 2.5*   GFR: Estimated Creatinine Clearance: 27.3 mL/min (A) (by C-G formula based on SCr of 1.04 mg/dL (H)). Liver Function Tests: Recent Labs  Lab 04/06/21 0425  AST 51*  ALT 18  ALKPHOS 37*  BILITOT 0.6  PROT 6.0*  ALBUMIN 3.3*   No  results for input(s): LIPASE, AMYLASE in the last 168 hours. No results for input(s): AMMONIA in the last 168 hours. Coagulation Profile: Recent Labs  Lab 04/05/21 1429  INR 1.1   Cardiac Enzymes: Recent Labs  Lab 04/05/21 1429 04/06/21 0425  CKTOTAL 1,594* 1,187*   BNP (last 3 results) No results for input(s): PROBNP in the last 8760 hours. HbA1C: Recent Labs    04/05/21 1430  HGBA1C 6.7*   CBG: Recent Labs  Lab 04/05/21 2345 04/06/21 0751 04/06/21 1119 04/06/21 1609  GLUCAP 177* 130* 223* 87   Lipid Profile: No results for input(s): CHOL, HDL, LDLCALC, TRIG, CHOLHDL, LDLDIRECT in the last 72 hours. Thyroid Function Tests: Recent Labs    04/05/21 1429  TSH 0.587   Anemia Panel: No results for input(s): VITAMINB12, FOLATE, FERRITIN, TIBC, IRON, RETICCTPCT in the last 72 hours. Urine analysis:    Component Value Date/Time   COLORURINE YELLOW 04/05/2021 1448   APPEARANCEUR HAZY (A) 04/05/2021 1448   LABSPEC 1.020 04/05/2021 1448   PHURINE 5.0 04/05/2021 1448   GLUCOSEU NEGATIVE 04/05/2021 1448   HGBUR MODERATE (A) 04/05/2021 1448   BILIRUBINUR NEGATIVE 04/05/2021 1448   KETONESUR 20 (A) 04/05/2021 1448   PROTEINUR 30 (A) 04/05/2021 1448   NITRITE NEGATIVE 04/05/2021 1448   LEUKOCYTESUR SMALL (A) 04/05/2021 1448   Sepsis Labs: @LABRCNTIP (procalcitonin:4,lacticidven:4) ) Recent Results (from the past 240 hour(s))  Resp Panel by RT-PCR (Flu A&B, Covid) Nasopharyngeal Swab     Status: None   Collection Time: 04/05/21  2:52 PM   Specimen: Nasopharyngeal Swab; Nasopharyngeal(NP) swabs in vial transport medium  Result Value Ref Range Status   SARS Coronavirus 2 by RT PCR NEGATIVE NEGATIVE Final    Comment: (NOTE) SARS-CoV-2 target nucleic acids are NOT DETECTED.  The SARS-CoV-2 RNA is generally detectable in upper respiratory specimens during the acute phase of infection. The lowest concentration of SARS-CoV-2 viral copies this assay can detect is 138  copies/mL. A negative result does not preclude SARS-Cov-2 infection and should not be used as the sole basis for treatment or other patient management decisions. A negative result may occur with  improper specimen collection/handling, submission of specimen other than nasopharyngeal swab, presence of viral mutation(s) within the areas targeted by this assay, and inadequate number of viral copies(<138 copies/mL). A negative result must be combined with clinical observations, patient history, and epidemiological information. The expected result is Negative.  Fact Sheet for Patients:  EntrepreneurPulse.com.au  Fact Sheet for Healthcare Providers:  IncredibleEmployment.be  This test is no t yet approved or cleared by the [place] FDA and  has been authorized for detection and/or diagnosis of SARS-CoV-2 by FDA under an Emergency Use Authorization (EUA). This EUA will remain  in effect (meaning this test can be used) for the duration of the  COVID-19 declaration under Section 564(b)(1) of the Act, 21 U.S.C.section 360bbb-3(b)(1), unless the authorization is terminated  or revoked sooner.       Influenza A by PCR NEGATIVE NEGATIVE Final   Influenza B by PCR NEGATIVE NEGATIVE Final    Comment: (NOTE) The Xpert Xpress SARS-CoV-2/FLU/RSV plus assay is intended as an aid in the diagnosis of influenza from Nasopharyngeal swab specimens and should not be used as a sole basis for treatment. Nasal washings and aspirates are unacceptable for Xpert Xpress SARS-CoV-2/FLU/RSV testing.  Fact Sheet for Patients: EntrepreneurPulse.com.au  Fact Sheet for Healthcare Providers: IncredibleEmployment.be  This test is not yet approved or cleared by the [place] FDA and has been authorized for detection and/or diagnosis of SARS-CoV-2 by FDA under an Emergency Use Authorization (EUA). This EUA will remain in effect (meaning  this test can be used) for the duration of the COVID-19 declaration under Section 564(b)(1) of the Act, 21 U.S.C. section 360bbb-3(b)(1), unless the authorization is terminated or revoked.  Performed at [facility], [address], [place], [place] [address]          Radiology Studies: DG Chest 1 View  Result Date: 04/05/2021 CLINICAL DATA:  Fall today. EXAM: CHEST  1 VIEW COMPARISON:  Head CT 05/17/2015 FINDINGS: Right chest port with tip in the SVC. Lung volumes are low. Heart is normal in size. Suspected retrocardiac hiatal hernia. Streaky opacity at both lung bases likely atelectasis. No pneumothorax or pleural effusion. Surgical hardware in the lower thoracic spine is partially included. No acute osseous abnormalities are seen. IMPRESSION: 1. Low lung volumes with bibasilar atelectasis. No traumatic findings. 2. Suspected retrocardiac hiatal hernia. 3. Right chest port with tip in the SVC. Electronically Signed   By: Keith Rake M.D.   On: 04/05/2021 16:53   DG Tibia/Fibula Right  Result Date: 04/05/2021 CLINICAL DATA:  Fall today.  Right leg bruising and swelling. EXAM: RIGHT TIBIA AND FIBULA - 2 VIEW COMPARISON:  None. FINDINGS: Cortical margins of the tibia and fibula are intact. There is no evidence of fracture or other focal bone lesions. Knee and ankle alignment are maintained. Generalized soft tissue edema. IMPRESSION: Generalized soft tissue edema. No fracture or acute osseous abnormality. Electronically Signed   By: Keith Rake M.D.   On: 04/05/2021 16:54   CT Head Wo Contrast  Result Date: 04/05/2021 CLINICAL DATA:  Confusion EXAM: CT HEAD WITHOUT CONTRAST TECHNIQUE: Contiguous axial images were obtained from the base of the skull through the vertex without intravenous contrast. COMPARISON:  CT brain report 08/12/2018 FINDINGS: Brain: No acute territorial infarction, hemorrhage, or intracranial mass. Moderate atrophy and chronic small vessel ischemic changes of the white  matter. The ventricles are nonenlarged Vascular: No hyperdense vessels.  Carotid vascular calcification Skull: Normal. Negative for fracture or focal lesion. Sinuses/Orbits: No acute finding. Other: None IMPRESSION: 1. No CT evidence for acute intracranial abnormality. 2. Atrophy and chronic small vessel ischemic change of the white matter Electronically Signed   By: Donavan Foil M.D.   On: 04/05/2021 16:04      Scheduled Meds:  atorvastatin  10 mg Oral Daily   calcium carbonate  1,250 mg Oral BID WC   Chlorhexidine Gluconate Cloth  6 each Topical Daily   exemestane  25 mg Oral Daily   heparin  5,000 Units Subcutaneous Q8H   insulin aspart  0-15 Units Subcutaneous TID WC   levothyroxine  50 mcg Oral Daily   lidocaine  1 patch Transdermal Q24H   multivitamin  with minerals  1 tablet Oral Daily   pantoprazole  40 mg Oral Daily   Continuous Infusions:  sodium chloride 75 mL/hr at 04/06/21 0516     LOS: 0 days      Debbe Odea, MD [facility] Pager: [email or web address] 04/06/2021, 5:40 PM

## 2021-04-06 NOTE — ED Notes (Signed)
Pt medicated per MAR. Purewick functioning without complication, gown and linens dry. Report called to receiving nurse. VS updated and stable. Pt denies further needs at this time. Side rails up x 2, bed locked and low, call bell within reach. Will continue to monitor.

## 2021-04-07 LAB — CK: Total CK: 595 U/L — ABNORMAL HIGH (ref 38–234)

## 2021-04-07 LAB — BASIC METABOLIC PANEL
Anion gap: 5 (ref 5–15)
BUN: 28 mg/dL — ABNORMAL HIGH (ref 8–23)
CO2: 24 mmol/L (ref 22–32)
Calcium: 7.3 mg/dL — ABNORMAL LOW (ref 8.9–10.3)
Chloride: 104 mmol/L (ref 98–111)
Creatinine, Ser: 0.9 mg/dL (ref 0.44–1.00)
GFR, Estimated: >60 mL/min (ref >60)
Glucose, Bld: 128 mg/dL — ABNORMAL HIGH (ref 70–99)
Potassium: 3.7 mmol/L (ref 3.5–5.1)
Sodium: 133 mmol/L — ABNORMAL LOW (ref 135–145)

## 2021-04-07 LAB — GLUCOSE, CAPILLARY
Glucose-Capillary: 90 mg/dL (ref 70–99)
Glucose-Capillary: 225 mg/dL — ABNORMAL HIGH (ref 70–99)
Glucose-Capillary: 132 mg/dL — ABNORMAL HIGH (ref 70–99)
Glucose-Capillary: 137 mg/dL — ABNORMAL HIGH (ref 70–99)

## 2021-04-07 LAB — COMPREHENSIVE METABOLIC PANEL
ALT: 19 U/L (ref 0–44)
AST: 37 U/L (ref 15–41)
Albumin: 3.3 g/dL — ABNORMAL LOW (ref 3.5–5.0)
Alkaline Phosphatase: 38 U/L (ref 38–126)
Anion gap: 6 (ref 5–15)
BUN: 24 mg/dL — ABNORMAL HIGH (ref 8–23)
CO2: 24 mmol/L (ref 22–32)
Calcium: 7.6 mg/dL — ABNORMAL LOW (ref 8.9–10.3)
Chloride: 103 mmol/L (ref 98–111)
Creatinine, Ser: 1.1 mg/dL — ABNORMAL HIGH (ref 0.44–1.00)
GFR, Estimated: 49 mL/min — ABNORMAL LOW (ref >60)
Glucose, Bld: 152 mg/dL — ABNORMAL HIGH (ref 70–99)
Potassium: 4.2 mmol/L (ref 3.5–5.1)
Sodium: 133 mmol/L — ABNORMAL LOW (ref 135–145)
Total Bilirubin: 0.5 mg/dL (ref 0.3–1.2)
Total Protein: 6 g/dL — ABNORMAL LOW (ref 6.5–8.1)

## 2021-04-07 NOTE — Progress Notes (Signed)
Physical Therapy Treatment Patient Details Name: Rita Thomas MRN: [id number] DOB: 1931-11-25 Today's Date: 04/07/2021    History of Present Illness Libman  is a 87 y.o. female, with medical history notable for cataracts, diabetes mellitus, increased cholesterol, hypertension, osteoporosis. Patient has a long standing history of Stage IV, hormone sensitive breast cancer, patient was brought by her daughter for altered mental status, and she was found on the floor, apparently patient had one of the shelves fell at her left leg yesterday when she was in the kitchen, and I am when she was going to bedside commode, apparently she fell, and remained on the floor overnight where her daughter found her and called EMS, as well daughter reports mother has been more confused than usual recently, but overall she has been eating or drinking good, patient denies any chest pain, nausea, vomiting, abdominal pain, fever or chills    PT Comments    Patient seated in chair at beginning of session. Patient completes exercises in chair slowly with frequent reminders to continue completing. Patient demonstrates fair sitting balance and tolerance edge of chair while completing exercises. Patient requires min/mod assist to transfer to standing with RW and is reliant on pressing knees into chair to remain standing. Patient with slouched posture and has difficulty to transition to full standing secondary to LE and truncal weakness. Patient with poor balance with RW and is limited by fatigue today. Patient repositioned in chair at end of session. Patient will benefit from continued physical therapy in hospital and recommended venue below to increase strength, balance, endurance for safe ADLs and gait.   Follow Up Recommendations  SNF     Equipment Recommendations  None recommended by PT    Recommendations for Other Services       Precautions / Restrictions Precautions Precautions: Fall Restrictions Weight  Bearing Restrictions: No    Mobility  Bed Mobility               General bed mobility comments: seated in chair at beginning of session    Transfers Overall transfer level: Needs assistance Equipment used: Rolling walker (2 wheeled) Transfers: Sit to/from Stand Sit to Stand: Min assist;Mod assist         General transfer comment: slow labored movement with increased time and verbal cues on hand placement  Ambulation/Gait                 Stairs             Wheelchair Mobility    Modified Rankin (Stroke Patients Only)       Balance Overall balance assessment: Needs assistance Sitting-balance support: Feet supported;Bilateral upper extremity supported Sitting balance-Leahy Scale: Fair Sitting balance - Comments: fair/poor with RW   Standing balance support: Bilateral upper extremity supported;During functional activity Standing balance-Leahy Scale: Fair Standing balance comment: with RW                            Cognition Arousal/Alertness: Awake/alert Behavior During Therapy: WFL for tasks assessed/performed Overall Cognitive Status: Within Functional Limits for tasks assessed                                        Exercises General Exercises - Lower Extremity Ankle Circles/Pumps: PROM;Both;20 reps;Seated Long Arc Quad: AROM;Both;20 reps;Seated Hip Flexion/Marching: AROM;Both;20 reps;Seated    General Comments  Pertinent Vitals/Pain Pain Assessment: No/denies pain    Home Living                      Prior Function            PT Goals (current goals can now be found in the care plan section) Acute Rehab PT Goals Patient Stated Goal: return home after rehab PT Goal Formulation: With patient Time For Goal Achievement: 04/20/21 Potential to Achieve Goals: Good Progress towards PT goals: Progressing toward goals    Frequency    Min 3X/week      PT Plan Current plan remains  appropriate    Co-evaluation              AM-PAC PT "6 Clicks" Mobility   Outcome Measure  Help needed turning from your back to your side while in a flat bed without using bedrails?: A Little Help needed moving from lying on your back to sitting on the side of a flat bed without using bedrails?: A Little Help needed moving to and from a bed to a chair (including a wheelchair)?: A Lot Help needed standing up from a chair using your arms (e.g., wheelchair or bedside chair)?: A Lot Help needed to walk in hospital room?: A Lot Help needed climbing 3-5 steps with a railing? : A Lot 6 Click Score: 14    End of Session Equipment Utilized During Treatment: Gait belt Activity Tolerance: Patient tolerated treatment well;Patient limited by fatigue Patient left: in chair;with call bell/phone within reach;with chair alarm set Nurse Communication: Mobility status PT Visit Diagnosis: Unsteadiness on feet (R26.81);Muscle weakness (generalized) (M62.81);Other abnormalities of gait and mobility (R26.89)     Time: 4967-5916 PT Time Calculation (min) (ACUTE ONLY): 21 min  Charges:  $Therapeutic Exercise: 8-22 mins                     1:32 PM, 04/07/21 Mearl Latin PT, DPT Physical Therapist at [facility]

## 2021-04-07 NOTE — Progress Notes (Signed)
RN notified me for A-fib on telemetry monitor. I requested an EKG which show a sinus arrhythmia. Continue to follow on telemetry.

## 2021-04-07 NOTE — Progress Notes (Signed)
Shift Note: Patient has been alert/oriented and pleasant. Patient is very receptive to care, and highly motivated to participate in therapy. She has tolerated her meds well. Vitals have been stable. Received call from central telemetry regarding patient appearing to be going in and out of AFIB. Notified Dr. Wynelle Cleveland. Patient at the time sitting up eating dinner with no s/s distress.

## 2021-04-07 NOTE — TOC Progression Note (Signed)
Transition of Care [facility]) - Progression Note    Patient Details  Name: Rita Thomas MRN: [id number] Date of Birth: [birth year]  [facility] CM/SW [place], [place] Phone Number: 04/07/2021, 12:03 PM  Clinical Narrative:    CSW spoke with Mardene Celeste at [facility] who states that she will accept pt but does not have a bed available until Monday. Mardene Celeste also inquired about if the family would be able to provide the exemesrane (Aroma Sin) medication as the facility may not be able to get this for pt. CSW spoke with pt and her daughter in the room extensively about what plan they would like to go with. They feel that as SNF has been recommended it would be the best option for the patient as it is not safe for her to return home alone. Pts daughter states she would be able to provide the facility with the medication and understands it needs to be in the original prescription bottles. Pt has been vaccinated x2 for COVID and boosted once, her daughter was inquiring as to if she could get a second booster here, CSW unsure of this but did ask pts MD about this. TOC to follow for additional needs.   Expected Discharge Plan: [place] Barriers to Discharge: Continued Medical Work up  Expected Discharge Plan and Services Expected Discharge Plan: [place] In-house Referral: Clinical Social Work Discharge Planning Services: CM Consult Post Acute Care Choice: [place] arrangements for the past 2 months: Single Family Home                                       Social Determinants of Health (SDOH) Interventions    Readmission Risk Interventions No flowsheet data found.

## 2021-04-07 NOTE — Plan of Care (Signed)

## 2021-04-07 NOTE — Progress Notes (Signed)
PROGRESS NOTE    Rita Thomas   EHM:[id number]  DOB: 1931/11/23  DOA: 04/05/2021 PCP: Pcp, No   Brief Narrative:  Rita Thomas is an 87 year old female with diabetes mellitus, hypertension, hyperlipidemia, stage IV breast cancer status post chemoradiation with metastasis to the spine is brought to the ED by EMS with complaints of altered mental status.  According to the patient's daughter who was in the room, the patient appeared to have slid off of the commode and was lying on the floor and stool and urine.  Apparently she remained there overnight and her daughter found her in the morning. In the ED she was noted to have a sodium of 132 and the creatinine that was 1.32 (above from 0.4 in 2014).  Total CK was 1594.   Subjective: She continues to feel very weak.    Assessment & Plan:   Principal Problem: Hyponatremia/AKI (acute kidney injury)/rhabdomyolysis -Holding lisinopril -BUN/creatinine ratio is elevated greater than 20 signifying dehydration -I Will DC IV fluids today encourage oral hydration -CK has improved to 595 creatinine to 0.9 now although, BUN/creatinine ratio remains elevated  Active Problems:  Mildly elevated LFTs - Possibly related to rhabdomyolysis-  Breast cancer with metastasis to the spine - Status post chemo, radiation, laminectomy for spinal cord compression at T11-T12 -Continue Aromasin  Hypothyroidism - Continue Synthroid  Diabetes mellitus -A1c 6.7 - Holding Januvia  Mild hematoma on right leg likely due to fall - Follow-appears stable today  Time spent in minutes: 25 DVT prophylaxis: heparin injection 5,000 Units Start: 04/06/21 2200   Code Status: DNR Family Communication: Daughter at bedside Level of Care: Level of care: Telemetry Disposition Plan:  Status is: Observation  The patient will require care spanning > 2 midnights and should be moved to inpatient because: IV treatments appropriate due to intensity of illness or  inability to take PO  Dispo: The patient is from: Home              Anticipated d/c is to: Skilled nursing facility-I have been told the bed will be available on Monday              Patient currently is medically stable to d/c.   Difficult to place patient No      Consultants:  None Procedures:  None Antimicrobials:  Anti-infectives (From admission, onward)    None        Objective: Vitals:   04/06/21 1401 04/06/21 2141 04/07/21 0600 04/07/21 1402  BP: (!) 141/79 111/61 124/74 108/63  Pulse: 96 73 77 84  Resp: 18 16 16 16   Temp: 98.3 F (36.8 C) 98 F (36.7 C) 97.8 F (36.6 C) 98.8 F (37.1 C)  TempSrc: Oral Oral Oral Oral  SpO2: 97% 95% 96% 95%  Weight:      Height:        Intake/Output Summary (Last 24 hours) at 04/07/2021 1759 Last data filed at 04/07/2021 1234 Gross per 24 hour  Intake 1592.75 ml  Output 300 ml  Net 1292.75 ml    Filed Weights   04/05/21 1359  Weight: 59 kg    Examination: General exam: Appears comfortable  HEENT: PERRLA, oral mucosa moist, no sclera icterus or thrush Respiratory system: Clear to auscultation. Respiratory effort normal. Cardiovascular system: S1 & S2 heard, regular rate and rhythm Gastrointestinal system: Abdomen soft, non-tender, nondistended. Normal bowel sounds   Central nervous system: Alert and oriented. No focal neurological deficits. Extremities: No cyanosis, clubbing or edema Skin: No rashes or ulcers  Psychiatry:  Mood & affect appropriate.       Data Reviewed: I have personally reviewed following labs and imaging studies  CBC: Recent Labs  Lab 04/05/21 1429  WBC 7.4  HGB 10.8*  HCT 31.4*  MCV 98.1  PLT 389    Basic Metabolic Panel: Recent Labs  Lab 04/05/21 1429 04/06/21 0425 04/07/21 0430  NA 132* 135 133*  K 4.1 4.0 3.7  CL 96* 103 104  CO2 25 30 24   GLUCOSE 160* 115* 128*  BUN 39* 28* 28*  CREATININE 1.32* 1.04* 0.90  CALCIUM 9.3 7.8* 7.3*  MG 1.3* 2.5*  --      GFR: Estimated Creatinine Clearance: 31.6 mL/min (by C-G formula based on SCr of 0.9 mg/dL). Liver Function Tests: Recent Labs  Lab 04/06/21 0425  AST 51*  ALT 18  ALKPHOS 37*  BILITOT 0.6  PROT 6.0*  ALBUMIN 3.3*    No results for input(s): LIPASE, AMYLASE in the last 168 hours. No results for input(s): AMMONIA in the last 168 hours. Coagulation Profile: Recent Labs  Lab 04/05/21 1429  INR 1.1    Cardiac Enzymes: Recent Labs  Lab 04/05/21 1429 04/06/21 0425 04/07/21 0430  CKTOTAL 1,594* 1,187* 595*    BNP (last 3 results) No results for input(s): PROBNP in the last 8760 hours. HbA1C: Recent Labs    04/05/21 1430  HGBA1C 6.7*    CBG: Recent Labs  Lab 04/06/21 1609 04/06/21 2034 04/07/21 0740 04/07/21 1106 04/07/21 1610  GLUCAP 87 143* 90 225* 132*    Lipid Profile: No results for input(s): CHOL, HDL, LDLCALC, TRIG, CHOLHDL, LDLDIRECT in the last 72 hours. Thyroid Function Tests: Recent Labs    04/05/21 1429  TSH 0.587    Anemia Panel: No results for input(s): VITAMINB12, FOLATE, FERRITIN, TIBC, IRON, RETICCTPCT in the last 72 hours. Urine analysis:    Component Value Date/Time   COLORURINE YELLOW 04/05/2021 1448   APPEARANCEUR HAZY (A) 04/05/2021 1448   LABSPEC 1.020 04/05/2021 1448   PHURINE 5.0 04/05/2021 1448   GLUCOSEU NEGATIVE 04/05/2021 1448   HGBUR MODERATE (A) 04/05/2021 1448   BILIRUBINUR NEGATIVE 04/05/2021 1448   KETONESUR 20 (A) 04/05/2021 1448   PROTEINUR 30 (A) 04/05/2021 1448   NITRITE NEGATIVE 04/05/2021 1448   LEUKOCYTESUR SMALL (A) 04/05/2021 1448   Sepsis Labs: @LABRCNTIP (procalcitonin:4,lacticidven:4) ) Recent Results (from the past 240 hour(s))  Resp Panel by RT-PCR (Flu A&B, Covid) Nasopharyngeal Swab     Status: None   Collection Time: 04/05/21  2:52 PM   Specimen: Nasopharyngeal Swab; Nasopharyngeal(NP) swabs in vial transport medium  Result Value Ref Range Status   SARS Coronavirus 2 by RT PCR  NEGATIVE NEGATIVE Final    Comment: (NOTE) SARS-CoV-2 target nucleic acids are NOT DETECTED.  The SARS-CoV-2 RNA is generally detectable in upper respiratory specimens during the acute phase of infection. The lowest concentration of SARS-CoV-2 viral copies this assay can detect is 138 copies/mL. A negative result does not preclude SARS-Cov-2 infection and should not be used as the sole basis for treatment or other patient management decisions. A negative result may occur with  improper specimen collection/handling, submission of specimen other than nasopharyngeal swab, presence of viral mutation(s) within the areas targeted by this assay, and inadequate number of viral copies(<138 copies/mL). A negative result must be combined with clinical observations, patient history, and epidemiological information. The expected result is Negative.  Fact Sheet for Patients:  EntrepreneurPulse.com.au  Fact Sheet for Healthcare Providers:  IncredibleEmployment.be  This test  is no t yet approved or cleared by the [place] and  has been authorized for detection and/or diagnosis of SARS-CoV-2 by FDA under an Emergency Use Authorization (EUA). This EUA will remain  in effect (meaning this test can be used) for the duration of the COVID-19 declaration under Section 564(b)(1) of the Act, 21 U.S.C.section 360bbb-3(b)(1), unless the authorization is terminated  or revoked sooner.       Influenza A by PCR NEGATIVE NEGATIVE Final   Influenza B by PCR NEGATIVE NEGATIVE Final    Comment: (NOTE) The Xpert Xpress SARS-CoV-2/FLU/RSV plus assay is intended as an aid in the diagnosis of influenza from Nasopharyngeal swab specimens and should not be used as a sole basis for treatment. Nasal washings and aspirates are unacceptable for Xpert Xpress SARS-CoV-2/FLU/RSV testing.  Fact Sheet for Patients: EntrepreneurPulse.com.au  Fact Sheet for  Healthcare Providers: IncredibleEmployment.be  This test is not yet approved or cleared by the [place] FDA and has been authorized for detection and/or diagnosis of SARS-CoV-2 by FDA under an Emergency Use Authorization (EUA). This EUA will remain in effect (meaning this test can be used) for the duration of the COVID-19 declaration under Section 564(b)(1) of the Act, 21 U.S.C. section 360bbb-3(b)(1), unless the authorization is terminated or revoked.  Performed at [facility], [address] [place], [place] [address]          Radiology Studies: No results found.    Scheduled Meds:  atorvastatin  10 mg Oral Daily   calcium carbonate  1,250 mg Oral BID WC   Chlorhexidine Gluconate Cloth  6 each Topical Daily   exemestane  25 mg Oral Daily   heparin  5,000 Units Subcutaneous Q8H   insulin aspart  0-15 Units Subcutaneous TID WC   levothyroxine  50 mcg Oral Daily   lidocaine  1 patch Transdermal Q24H   multivitamin with minerals  1 tablet Oral Daily   pantoprazole  40 mg Oral Daily   Continuous Infusions:  sodium chloride 75 mL/hr at 04/07/21 1046     LOS: 1 day      Debbe Odea, MD [facility] Pager: [email or web address] 04/07/2021, 5:59 PM

## 2021-04-08 LAB — GLUCOSE, CAPILLARY
Glucose-Capillary: 120 mg/dL — ABNORMAL HIGH (ref 70–99)
Glucose-Capillary: 129 mg/dL — ABNORMAL HIGH (ref 70–99)
Glucose-Capillary: 135 mg/dL — ABNORMAL HIGH (ref 70–99)
Glucose-Capillary: 147 mg/dL — ABNORMAL HIGH (ref 70–99)

## 2021-04-08 NOTE — Progress Notes (Signed)
PROGRESS NOTE    Rita Thomas   RDE:[id number]  DOB: [birth year]  DOA: 04/05/2021 PCP: Pcp, No   Brief Narrative:  Rita Thomas is an 87 year old female with diabetes mellitus, hypertension, hyperlipidemia, stage IV breast cancer status post chemoradiation with metastasis to the spine is brought to the ED by EMS with complaints of altered mental status.  According to the patient's daughter who was in the room, the patient appeared to have slid off of the commode and was lying on the floor and stool and urine.  Apparently she remained there overnight and her daughter found her in the morning. In the ED she was noted to have a sodium of 132 and the creatinine that was 1.32 (above from 0.4 in 2014).  Total CK was 1594.   Subjective: No new complaints.     Assessment & Plan:   Principal Problem: Hyponatremia/AKI (acute kidney injury)/rhabdomyolysis -Holding lisinopril -BUN/creatinine ratio is elevated greater than 20 signifying dehydration -I Will DC IV fluids today encourage oral hydration -CK has improved to 595 creatinine to 0.9 now although, BUN/creatinine ratio remains elevated - Cr up from 0.9 to 1.10 today- cont to follow w/o resuming IVF  Active Problems:  Mildly elevated LFTs - Possibly related to rhabdomyolysis - have normalized as of today  Breast cancer with metastasis to the spine - Status post chemo, radiation, laminectomy for spinal cord compression at T11-T12 -Continue Aromasin  Hypothyroidism - Continue Synthroid  Diabetes mellitus -A1c 6.7 - Holding Januvia  Mild hematoma on right leg likely due to fall - Follow-appears stable today  Time spent in minutes: 25 DVT prophylaxis: heparin injection 5,000 Units Start: 04/06/21 2200   Code Status: DNR Family Communication: Daughter at bedside Level of Care: Level of care: Telemetry Disposition Plan:  Status is: Inpatient Not a safe discharge  Dispo: The patient is from: Home              Anticipated  d/c is to: Skilled nursing facility-I have been told the bed will be available on Monday              Patient currently is medically stable to d/c.   Difficult to place patient No      Consultants:  None Procedures:  None Antimicrobials:  Anti-infectives (From admission, onward)    None        Objective: Vitals:   04/07/21 1402 04/07/21 2037 04/08/21 0329 04/08/21 1309  BP: 108/63 128/72 118/66 133/69  Pulse: 84 80 92 81  Resp: 16 19 18 16   Temp: 98.8 F (37.1 C) 98 F (36.7 C) 99 F (37.2 C) 98.4 F (36.9 C)  TempSrc: Oral   Oral  SpO2: 95% 95% 94% 98%  Weight:      Height:        Intake/Output Summary (Last 24 hours) at 04/08/2021 1727 Last data filed at 04/08/2021 1100 Gross per 24 hour  Intake 720 ml  Output 300 ml  Net 420 ml    Filed Weights   04/05/21 1359  Weight: 59 kg    Examination: General exam: Appears comfortable  HEENT: PERRLA, oral mucosa moist, no sclera icterus or thrush Respiratory system: Clear to auscultation. Respiratory effort normal. Cardiovascular system: S1 & S2 heard, regular rate and rhythm Gastrointestinal system: Abdomen soft, non-tender, nondistended. Normal bowel sounds   Central nervous system: Alert and oriented. No focal neurological deficits. Extremities: No cyanosis, clubbing or edema Skin: No rashes or ulcers- hematoma on RLE appears slightly improved Psychiatry:  Mood &  affect appropriate.       Data Reviewed: I have personally reviewed following labs and imaging studies  CBC: Recent Labs  Lab 04/05/21 1429  WBC 7.4  HGB 10.8*  HCT 31.4*  MCV 98.1  PLT 073    Basic Metabolic Panel: Recent Labs  Lab 04/05/21 1429 04/06/21 0425 04/07/21 0430 04/07/21 1824  NA 132* 135 133* 133*  K 4.1 4.0 3.7 4.2  CL 96* 103 104 103  CO2 25 30 24 24   GLUCOSE 160* 115* 128* 152*  BUN 39* 28* 28* 24*  CREATININE 1.32* 1.04* 0.90 1.10*  CALCIUM 9.3 7.8* 7.3* 7.6*  MG 1.3* 2.5*  --   --     GFR: Estimated  Creatinine Clearance: 25.8 mL/min (A) (by C-G formula based on SCr of 1.1 mg/dL (H)). Liver Function Tests: Recent Labs  Lab 04/06/21 0425 04/07/21 1824  AST 51* 37  ALT 18 19  ALKPHOS 37* 38  BILITOT 0.6 0.5  PROT 6.0* 6.0*  ALBUMIN 3.3* 3.3*    No results for input(s): LIPASE, AMYLASE in the last 168 hours. No results for input(s): AMMONIA in the last 168 hours. Coagulation Profile: Recent Labs  Lab 04/05/21 1429  INR 1.1    Cardiac Enzymes: Recent Labs  Lab 04/05/21 1429 04/06/21 0425 04/07/21 0430  CKTOTAL 1,594* 1,187* 595*    BNP (last 3 results) No results for input(s): PROBNP in the last 8760 hours. HbA1C: No results for input(s): HGBA1C in the last 72 hours.  CBG: Recent Labs  Lab 04/07/21 1610 04/07/21 2038 04/08/21 0732 04/08/21 1158 04/08/21 1600  GLUCAP 132* 137* 120* 129* 135*    Lipid Profile: No results for input(s): CHOL, HDL, LDLCALC, TRIG, CHOLHDL, LDLDIRECT in the last 72 hours. Thyroid Function Tests: No results for input(s): TSH, T4TOTAL, FREET4, T3FREE, THYROIDAB in the last 72 hours.  Anemia Panel: No results for input(s): VITAMINB12, FOLATE, FERRITIN, TIBC, IRON, RETICCTPCT in the last 72 hours. Urine analysis:    Component Value Date/Time   COLORURINE YELLOW 04/05/2021 1448   APPEARANCEUR HAZY (A) 04/05/2021 1448   LABSPEC 1.020 04/05/2021 1448   PHURINE 5.0 04/05/2021 1448   GLUCOSEU NEGATIVE 04/05/2021 1448   HGBUR MODERATE (A) 04/05/2021 1448   BILIRUBINUR NEGATIVE 04/05/2021 1448   KETONESUR 20 (A) 04/05/2021 1448   PROTEINUR 30 (A) 04/05/2021 1448   NITRITE NEGATIVE 04/05/2021 1448   LEUKOCYTESUR SMALL (A) 04/05/2021 1448   Sepsis Labs: @LABRCNTIP (procalcitonin:4,lacticidven:4) ) Recent Results (from the past 240 hour(s))  Resp Panel by RT-PCR (Flu A&B, Covid) Nasopharyngeal Swab     Status: None   Collection Time: 04/05/21  2:52 PM   Specimen: Nasopharyngeal Swab; Nasopharyngeal(NP) swabs in vial transport  medium  Result Value Ref Range Status   SARS Coronavirus 2 by RT PCR NEGATIVE NEGATIVE Final    Comment: (NOTE) SARS-CoV-2 target nucleic acids are NOT DETECTED.  The SARS-CoV-2 RNA is generally detectable in upper respiratory specimens during the acute phase of infection. The lowest concentration of SARS-CoV-2 viral copies this assay can detect is 138 copies/mL. A negative result does not preclude SARS-Cov-2 infection and should not be used as the sole basis for treatment or other patient management decisions. A negative result may occur with  improper specimen collection/handling, submission of specimen other than nasopharyngeal swab, presence of viral mutation(s) within the areas targeted by this assay, and inadequate number of viral copies(<138 copies/mL). A negative result must be combined with clinical observations, patient history, and epidemiological information. The expected result is  Negative.  Fact Sheet for Patients:  EntrepreneurPulse.com.au  Fact Sheet for Healthcare Providers:  IncredibleEmployment.be  This test is no t yet approved or cleared by the [place] FDA and  has been authorized for detection and/or diagnosis of SARS-CoV-2 by FDA under an Emergency Use Authorization (EUA). This EUA will remain  in effect (meaning this test can be used) for the duration of the COVID-19 declaration under Section 564(b)(1) of the Act, 21 U.S.C.section 360bbb-3(b)(1), unless the authorization is terminated  or revoked sooner.       Influenza A by PCR NEGATIVE NEGATIVE Final   Influenza B by PCR NEGATIVE NEGATIVE Final    Comment: (NOTE) The Xpert Xpress SARS-CoV-2/FLU/RSV plus assay is intended as an aid in the diagnosis of influenza from Nasopharyngeal swab specimens and should not be used as a sole basis for treatment. Nasal washings and aspirates are unacceptable for Xpert Xpress SARS-CoV-2/FLU/RSV testing.  Fact Sheet for  Patients: EntrepreneurPulse.com.au  Fact Sheet for Healthcare Providers: IncredibleEmployment.be  This test is not yet approved or cleared by the [place] FDA and has been authorized for detection and/or diagnosis of SARS-CoV-2 by FDA under an Emergency Use Authorization (EUA). This EUA will remain in effect (meaning this test can be used) for the duration of the COVID-19 declaration under Section 564(b)(1) of the Act, 21 U.S.C. section 360bbb-3(b)(1), unless the authorization is terminated or revoked.  Performed at [facility], [address], [place],  [address]          Radiology Studies: No results found.    Scheduled Meds:  atorvastatin  10 mg Oral Daily   calcium carbonate  1,250 mg Oral BID WC   Chlorhexidine Gluconate Cloth  6 each Topical Daily   exemestane  25 mg Oral Daily   heparin  5,000 Units Subcutaneous Q8H   insulin aspart  0-15 Units Subcutaneous TID WC   levothyroxine  50 mcg Oral Daily   lidocaine  1 patch Transdermal Q24H   multivitamin with minerals  1 tablet Oral Daily   pantoprazole  40 mg Oral Daily   Continuous Infusions:     LOS: 2 days      Debbe Odea, MD [facility] Pager: [email or web address] 04/08/2021, 5:27 PM

## 2021-04-09 LAB — BASIC METABOLIC PANEL
Anion gap: 9 (ref 5–15)
BUN: 16 mg/dL (ref 8–23)
CO2: 24 mmol/L (ref 22–32)
Calcium: 7.8 mg/dL — ABNORMAL LOW (ref 8.9–10.3)
Chloride: 103 mmol/L (ref 98–111)
Creatinine, Ser: 0.81 mg/dL (ref 0.44–1.00)
GFR, Estimated: >60 mL/min (ref >60)
Glucose, Bld: 113 mg/dL — ABNORMAL HIGH (ref 70–99)
Potassium: 3.6 mmol/L (ref 3.5–5.1)
Sodium: 136 mmol/L (ref 135–145)

## 2021-04-09 LAB — GLUCOSE, CAPILLARY
Glucose-Capillary: 127 mg/dL — ABNORMAL HIGH (ref 70–99)
Glucose-Capillary: 107 mg/dL — ABNORMAL HIGH (ref 70–99)
Glucose-Capillary: 120 mg/dL — ABNORMAL HIGH (ref 70–99)
Glucose-Capillary: 193 mg/dL — ABNORMAL HIGH (ref 70–99)

## 2021-04-09 MED ORDER — LISINOPRIL 10 MG PO TABS
20.0000 mg | ORAL_TABLET | Freq: Every day | ORAL | Status: DC
  Administered 2021-04-09 – 2021-04-10 (×2): 20 mg via ORAL
  Filled 2021-04-09 (×2): qty 2

## 2021-04-09 MED ORDER — COVID-19 MRNA VACC (MODERNA) 50 MCG/0.25ML IM SUSP
0.2500 mL | Freq: Once | INTRAMUSCULAR | Status: DC
  Filled 2021-04-09: qty 0.25

## 2021-04-09 NOTE — Progress Notes (Signed)
PROGRESS NOTE    Reni Hausner   TIW:[id number]  DOB: 1932/01/18  DOA: 04/05/2021 PCP: Pcp, No   Brief Narrative:  Rita Thomas is an 87 year old female with diabetes mellitus, hypertension, hyperlipidemia, stage IV breast cancer status post chemoradiation with metastasis to the spine is brought to the ED by EMS with complaints of altered mental status.  According to the patient's daughter who was in the room, the patient appeared to have slid off of the commode and was lying on the floor and stool and urine.  Apparently she remained there overnight and her daughter found her in the morning. In the ED she was noted to have a sodium of 132 and the creatinine that was 1.32 (above from 0.4 in 2014).  Total CK was 1594.   Subjective: No new complaints.     Assessment & Plan:   Principal Problem: Hyponatremia/AKI (acute kidney injury)/rhabdomyolysis -Holding lisinopril -BUN/creatinine ratio is elevated greater than 20 signifying dehydration - Cr and CPK steadily improving - 7/10> Cr has improved to 0.81 today  Active Problems:  Mildly elevated LFTs - Possibly related to rhabdomyolysis - have normalized as of 7/9  Breast cancer with metastasis to the spine - Status post chemo, radiation, laminectomy for spinal cord compression at T11-T12 -Continue Aromasin  Hypothyroidism - Continue Synthroid  Diabetes mellitus -A1c 6.7 - Holding Januvia  Mild hematoma on right leg likely due to fall -noted to be improving  Time spent in minutes: 25 DVT prophylaxis: heparin injection 5,000 Units Start: 04/06/21 2200   Code Status: DNR Family Communication: Daughter at bedside Level of Care: Level of care: Telemetry Disposition Plan:  Status is: Inpatient Not a safe discharge  Dispo: The patient is from: Home              Anticipated d/c is to: Skilled nursing facility-I have been told the bed will be available on Monday              Patient currently is medically stable to  d/c.   Difficult to place patient No      Consultants:  None Procedures:  None Antimicrobials:  Anti-infectives (From admission, onward)    None        Objective: Vitals:   04/08/21 1309 04/08/21 2021 04/09/21 0430 04/09/21 1356  BP: 133/69 (!) 141/91 (!) 145/81 131/70  Pulse: 81 80 91 80  Resp: 16 19 18 20   Temp: 98.4 F (36.9 C) 98 F (36.7 C) 98.2 F (36.8 C) 98.6 F (37 C)  TempSrc: Oral     SpO2: 98% 97% 96% 98%  Weight:      Height:        Intake/Output Summary (Last 24 hours) at 04/09/2021 1741 Last data filed at 04/09/2021 1700 Gross per 24 hour  Intake 2040 ml  Output 200 ml  Net 1840 ml    Filed Weights   04/05/21 1359  Weight: 59 kg    Examination: General exam: Appears comfortable  HEENT: PERRLA, oral mucosa moist, no sclera icterus or thrush Respiratory system: Clear to auscultation. Respiratory effort normal. Cardiovascular system: S1 & S2 heard, regular rate and rhythm Gastrointestinal system: Abdomen soft, non-tender, nondistended. Normal bowel sounds   Central nervous system: Alert and oriented. No focal neurological deficits. Extremities: No cyanosis, clubbing or edema Skin: No rashes or ulcers- hematoma on right leg improving Psychiatry:  Mood & affect appropriate.       Data Reviewed: I have personally reviewed following labs and imaging studies  CBC:  Recent Labs  Lab 04/05/21 1429  WBC 7.4  HGB 10.8*  HCT 31.4*  MCV 98.1  PLT 161    Basic Metabolic Panel: Recent Labs  Lab 04/05/21 1429 04/06/21 0425 04/07/21 0430 04/07/21 1824 04/09/21 0345  NA 132* 135 133* 133* 136  K 4.1 4.0 3.7 4.2 3.6  CL 96* 103 104 103 103  CO2 25 30 24 24 24   GLUCOSE 160* 115* 128* 152* 113*  BUN 39* 28* 28* 24* 16  CREATININE 1.32* 1.04* 0.90 1.10* 0.81  CALCIUM 9.3 7.8* 7.3* 7.6* 7.8*  MG 1.3* 2.5*  --   --   --     GFR: Estimated Creatinine Clearance: 35.1 mL/min (by C-G formula based on SCr of 0.81 mg/dL). Liver Function  Tests: Recent Labs  Lab 04/06/21 0425 04/07/21 1824  AST 51* 37  ALT 18 19  ALKPHOS 37* 38  BILITOT 0.6 0.5  PROT 6.0* 6.0*  ALBUMIN 3.3* 3.3*    No results for input(s): LIPASE, AMYLASE in the last 168 hours. No results for input(s): AMMONIA in the last 168 hours. Coagulation Profile: Recent Labs  Lab 04/05/21 1429  INR 1.1    Cardiac Enzymes: Recent Labs  Lab 04/05/21 1429 04/06/21 0425 04/07/21 0430  CKTOTAL 1,594* 1,187* 595*    BNP (last 3 results) No results for input(s): PROBNP in the last 8760 hours. HbA1C: No results for input(s): HGBA1C in the last 72 hours.  CBG: Recent Labs  Lab 04/08/21 1600 04/08/21 2022 04/09/21 0725 04/09/21 1058 04/09/21 1611  GLUCAP 135* 147* 127* 107* 120*    Lipid Profile: No results for input(s): CHOL, HDL, LDLCALC, TRIG, CHOLHDL, LDLDIRECT in the last 72 hours. Thyroid Function Tests: No results for input(s): TSH, T4TOTAL, FREET4, T3FREE, THYROIDAB in the last 72 hours.  Anemia Panel: No results for input(s): VITAMINB12, FOLATE, FERRITIN, TIBC, IRON, RETICCTPCT in the last 72 hours. Urine analysis:    Component Value Date/Time   COLORURINE YELLOW 04/05/2021 1448   APPEARANCEUR HAZY (A) 04/05/2021 1448   LABSPEC 1.020 04/05/2021 1448   PHURINE 5.0 04/05/2021 1448   GLUCOSEU NEGATIVE 04/05/2021 1448   HGBUR MODERATE (A) 04/05/2021 1448   BILIRUBINUR NEGATIVE 04/05/2021 1448   KETONESUR 20 (A) 04/05/2021 1448   PROTEINUR 30 (A) 04/05/2021 1448   NITRITE NEGATIVE 04/05/2021 1448   LEUKOCYTESUR SMALL (A) 04/05/2021 1448   Sepsis Labs: @LABRCNTIP (procalcitonin:4,lacticidven:4) ) Recent Results (from the past 240 hour(s))  Resp Panel by RT-PCR (Flu A&B, Covid) Nasopharyngeal Swab     Status: None   Collection Time: 04/05/21  2:52 PM   Specimen: Nasopharyngeal Swab; Nasopharyngeal(NP) swabs in vial transport medium  Result Value Ref Range Status   SARS Coronavirus 2 by RT PCR NEGATIVE NEGATIVE Final     Comment: (NOTE) SARS-CoV-2 target nucleic acids are NOT DETECTED.  The SARS-CoV-2 RNA is generally detectable in upper respiratory specimens during the acute phase of infection. The lowest concentration of SARS-CoV-2 viral copies this assay can detect is 138 copies/mL. A negative result does not preclude SARS-Cov-2 infection and should not be used as the sole basis for treatment or other patient management decisions. A negative result may occur with  improper specimen collection/handling, submission of specimen other than nasopharyngeal swab, presence of viral mutation(s) within the areas targeted by this assay, and inadequate number of viral copies(<138 copies/mL). A negative result must be combined with clinical observations, patient history, and epidemiological information. The expected result is Negative.  Fact Sheet for Patients:  EntrepreneurPulse.com.au  Fact  Sheet for Healthcare Providers:  IncredibleEmployment.be  This test is no t yet approved or cleared by the [place] FDA and  has been authorized for detection and/or diagnosis of SARS-CoV-2 by FDA under an Emergency Use Authorization (EUA). This EUA will remain  in effect (meaning this test can be used) for the duration of the COVID-19 declaration under Section 564(b)(1) of the Act, 21 U.S.C.section 360bbb-3(b)(1), unless the authorization is terminated  or revoked sooner.       Influenza A by PCR NEGATIVE NEGATIVE Final   Influenza B by PCR NEGATIVE NEGATIVE Final    Comment: (NOTE) The Xpert Xpress SARS-CoV-2/FLU/RSV plus assay is intended as an aid in the diagnosis of influenza from Nasopharyngeal swab specimens and should not be used as a sole basis for treatment. Nasal washings and aspirates are unacceptable for Xpert Xpress SARS-CoV-2/FLU/RSV testing.  Fact Sheet for Patients: EntrepreneurPulse.com.au  Fact Sheet for Healthcare  Providers: IncredibleEmployment.be  This test is not yet approved or cleared by the [place] FDA and has been authorized for detection and/or diagnosis of SARS-CoV-2 by FDA under an Emergency Use Authorization (EUA). This EUA will remain in effect (meaning this test can be used) for the duration of the COVID-19 declaration under Section 564(b)(1) of the Act, 21 U.S.C. section 360bbb-3(b)(1), unless the authorization is terminated or revoked.  Performed at [facility], [address], [place], [place] [address]          Radiology Studies: No results found.    Scheduled Meds:  atorvastatin  10 mg Oral Daily   calcium carbonate  1,250 mg Oral BID WC   Chlorhexidine Gluconate Cloth  6 each Topical Daily   COVID-19 mRNA vaccine (Moderna)  0.25 mL Intramuscular Once   exemestane  25 mg Oral Daily   heparin  5,000 Units Subcutaneous Q8H   insulin aspart  0-15 Units Subcutaneous TID WC   levothyroxine  50 mcg Oral Daily   lidocaine  1 patch Transdermal Q24H   lisinopril  20 mg Oral Daily   multivitamin with minerals  1 tablet Oral Daily   pantoprazole  40 mg Oral Daily   Continuous Infusions:     LOS: 3 days      Debbe Odea, MD [facility] Pager: [email or web address] 04/09/2021, 5:41 PM

## 2021-04-10 ENCOUNTER — Inpatient Hospital Stay (HOSPITAL_COMMUNITY): Payer: Medicare Other

## 2021-04-10 DIAGNOSIS — C7951 Secondary malignant neoplasm of bone: Secondary | ICD-10-CM | POA: Diagnosis not present

## 2021-04-10 DIAGNOSIS — R5381 Other malaise: Secondary | ICD-10-CM

## 2021-04-10 DIAGNOSIS — I10 Essential (primary) hypertension: Secondary | ICD-10-CM | POA: Diagnosis not present

## 2021-04-10 DIAGNOSIS — I4891 Unspecified atrial fibrillation: Secondary | ICD-10-CM | POA: Diagnosis not present

## 2021-04-10 DIAGNOSIS — R41841 Cognitive communication deficit: Secondary | ICD-10-CM | POA: Diagnosis not present

## 2021-04-10 DIAGNOSIS — M6282 Rhabdomyolysis: Secondary | ICD-10-CM | POA: Diagnosis not present

## 2021-04-10 DIAGNOSIS — N184 Chronic kidney disease, stage 4 (severe): Secondary | ICD-10-CM

## 2021-04-10 DIAGNOSIS — E1122 Type 2 diabetes mellitus with diabetic chronic kidney disease: Secondary | ICD-10-CM

## 2021-04-10 DIAGNOSIS — E1142 Type 2 diabetes mellitus with diabetic polyneuropathy: Secondary | ICD-10-CM | POA: Diagnosis not present

## 2021-04-10 DIAGNOSIS — I471 Supraventricular tachycardia: Secondary | ICD-10-CM

## 2021-04-10 DIAGNOSIS — E1121 Type 2 diabetes mellitus with diabetic nephropathy: Secondary | ICD-10-CM

## 2021-04-10 DIAGNOSIS — R2689 Other abnormalities of gait and mobility: Secondary | ICD-10-CM | POA: Diagnosis not present

## 2021-04-10 DIAGNOSIS — Z9181 History of falling: Secondary | ICD-10-CM | POA: Diagnosis not present

## 2021-04-10 DIAGNOSIS — N179 Acute kidney failure, unspecified: Secondary | ICD-10-CM | POA: Diagnosis not present

## 2021-04-10 DIAGNOSIS — M5459 Other low back pain: Secondary | ICD-10-CM | POA: Diagnosis not present

## 2021-04-10 DIAGNOSIS — Z20822 Contact with and (suspected) exposure to covid-19: Secondary | ICD-10-CM | POA: Diagnosis not present

## 2021-04-10 DIAGNOSIS — R531 Weakness: Secondary | ICD-10-CM | POA: Diagnosis not present

## 2021-04-10 DIAGNOSIS — M6281 Muscle weakness (generalized): Secondary | ICD-10-CM | POA: Diagnosis not present

## 2021-04-10 LAB — ECHOCARDIOGRAM COMPLETE
AR max vel: 2.46 cm2
AV Area VTI: 2.48 cm2
AV Area mean vel: 2.76 cm2
AV Mean grad: 7 mmHg
AV Peak grad: 12.1 mmHg
Ao pk vel: 1.74 m/s
Area-P 1/2: 2.51 cm2
Height: 56 in
MV M vel: 4.89 m/s
MV Peak grad: 95.6 mmHg
MV VTI: 2.35 cm2
Radius: 0.3 cm
S' Lateral: 1.88 cm
Weight: 2080 oz

## 2021-04-10 LAB — PROTIME-INR
INR: 1 (ref 0.8–1.2)
Prothrombin Time: 13.6 seconds (ref 11.4–15.2)

## 2021-04-10 LAB — BASIC METABOLIC PANEL
Anion gap: 7 (ref 5–15)
BUN: 19 mg/dL (ref 8–23)
CO2: 28 mmol/L (ref 22–32)
Calcium: 8.2 mg/dL — ABNORMAL LOW (ref 8.9–10.3)
Chloride: 100 mmol/L (ref 98–111)
Creatinine, Ser: 1.04 mg/dL — ABNORMAL HIGH (ref 0.44–1.00)
GFR, Estimated: 52 mL/min — ABNORMAL LOW (ref >60)
Glucose, Bld: 131 mg/dL — ABNORMAL HIGH (ref 70–99)
Potassium: 4.2 mmol/L (ref 3.5–5.1)
Sodium: 135 mmol/L (ref 135–145)

## 2021-04-10 LAB — MAGNESIUM: Magnesium: 1.5 mg/dL — ABNORMAL LOW (ref 1.7–2.4)

## 2021-04-10 LAB — SARS CORONAVIRUS 2 (TAT 6-24 HRS): SARS Coronavirus 2: NEGATIVE

## 2021-04-10 LAB — GLUCOSE, CAPILLARY
Glucose-Capillary: 111 mg/dL — ABNORMAL HIGH (ref 70–99)
Glucose-Capillary: 312 mg/dL — ABNORMAL HIGH (ref 70–99)

## 2021-04-10 LAB — APTT: aPTT: 72 seconds — ABNORMAL HIGH (ref 24–36)

## 2021-04-10 LAB — HEMOGLOBIN AND HEMATOCRIT, BLOOD
HCT: 28.1 % — ABNORMAL LOW (ref 36.0–46.0)
Hemoglobin: 9 g/dL — ABNORMAL LOW (ref 12.0–15.0)

## 2021-04-10 MED ORDER — METOPROLOL TARTRATE 5 MG/5ML IV SOLN: 2.5000 mg | Freq: Four times a day (QID) | INTRAVENOUS | Status: DC | PRN

## 2021-04-10 MED ORDER — HEPARIN SOD (PORK) LOCK FLUSH 100 UNIT/ML IV SOLN
500.0000 [IU] | Freq: Once | INTRAVENOUS | Status: AC
  Administered 2021-04-10: 500 [IU] via INTRAVENOUS
  Filled 2021-04-10: qty 5

## 2021-04-10 MED ORDER — METOPROLOL SUCCINATE ER 25 MG PO TB24
12.5000 mg | ORAL_TABLET | Freq: Every day | ORAL | Status: DC
  Administered 2021-04-10: 12.5 mg via ORAL
  Filled 2021-04-10: qty 1

## 2021-04-10 MED ORDER — COVID-19 MRNA VACC (MODERNA) 50 MCG/0.25ML IM SUSP
0.2500 mL | Freq: Once | INTRAMUSCULAR | Status: AC
  Administered 2021-04-10: 0.25 mL via INTRAMUSCULAR
  Filled 2021-04-10: qty 0.25

## 2021-04-10 MED ORDER — MAGNESIUM SULFATE IN D5W 1-5 GM/100ML-% IV SOLN
1.0000 g | Freq: Once | INTRAVENOUS | Status: AC
  Administered 2021-04-10: 1 g via INTRAVENOUS
  Filled 2021-04-10: qty 100

## 2021-04-10 MED ORDER — SITAGLIPTIN PHOSPHATE 25 MG PO TABS: 25.0000 mg | ORAL_TABLET | Freq: Every day | ORAL | Status: AC

## 2021-04-10 MED ORDER — METOPROLOL SUCCINATE ER 25 MG PO TB24: 12.5000 mg | ORAL_TABLET | Freq: Every day | ORAL | Status: DC

## 2021-04-10 MED ORDER — METOPROLOL TARTRATE 5 MG/5ML IV SOLN
2.5000 mg | Freq: Once | INTRAVENOUS | Status: AC
  Administered 2021-04-10: 2.5 mg via INTRAVENOUS
  Filled 2021-04-10: qty 5

## 2021-04-10 NOTE — Progress Notes (Signed)
Physical Therapy Treatment Patient Details Name: Rita Thomas MRN: [id number] DOB: [birth year] Today's Date: 04/10/2021    History of Present Illness Devera  is a 87 y.o. female, with medical history notable for cataracts, diabetes mellitus, increased cholesterol, hypertension, osteoporosis. Patient has a long standing history of Stage IV, hormone sensitive breast cancer, patient was brought by her daughter for altered mental status, and she was found on the floor, apparently patient had one of the shelves fell at her left leg yesterday when she was in the kitchen, and I am when she was going to bedside commode, apparently she fell, and remained on the floor overnight where her daughter found her and called EMS, as well daughter reports mother has been more confused than usual recently, but overall she has been eating or drinking good, patient denies any chest pain, nausea, vomiting, abdominal pain, fever or chills    PT Comments    Patient progressing well. Patient in recliner at beginning of session. Agreeable to participating in therapy today. Daughter present throughout session. Patient required assistance for bed mobility, transfers and ambulation with RW. Patient required verbal cues during transfers and ambulation with RW for sequencing of steps and placement of hands. Patient positioned in bed at end of session requiring assistance to boost up in bed.  Patient would continue to benefit from skilled physical therapy in current environment and next venue to continue return to prior function and increase strength, endurance, balance, coordination, and functional mobility and gait skills.     Follow Up Recommendations  SNF     Equipment Recommendations  None recommended by PT    Recommendations for Other Services       Precautions / Restrictions Precautions Precautions: Fall Restrictions Weight Bearing Restrictions: No    Mobility  Bed Mobility Overal bed mobility: Needs  Assistance Bed Mobility: Sit to Supine       Sit to supine: Mod assist   General bed mobility comments: for lower extremities and trunk    Transfers Overall transfer level: Needs assistance Equipment used: Rolling walker (2 wheeled) Transfers: Sit to/from [facility];Anterior-Posterior Transfer Sit to Stand: Min assist Stand pivot transfers: Min Photographer transfers: Min guard   General transfer comment: verbal cues for sequencing of steps and placement of hands with RW  Ambulation/Gait Ambulation/Gait assistance: Min guard Gait Distance (Feet): 70 Feet Assistive device: Rolling walker (2 wheeled) Gait Pattern/deviations: Step-through pattern;Decreased step length - right;Decreased step length - left;Decreased stride length;Trunk flexed;Wide base of support Gait velocity: decreased   General Gait Details: slow, labored gait with RW, significant thoracic kyphosis, repeated verbal cues to ambulate within base of support; limited by fatigue; on room air.   Stairs             Wheelchair Mobility    Modified Rankin (Stroke Patients Only)       Balance Overall balance assessment: Needs assistance Sitting-balance support: Feet supported;Bilateral upper extremity supported Sitting balance-Leahy Scale: Fair Sitting balance - Comments: fair/poor with RW   Standing balance support: Bilateral upper extremity supported;During functional activity Standing balance-Leahy Scale: Fair Standing balance comment: with RW                            Cognition Arousal/Alertness: Awake/alert Behavior During Therapy: WFL for tasks assessed/performed Overall Cognitive Status: Within Functional Limits for tasks assessed  Exercises General Exercises - Upper Extremity Shoulder Flexion: AROM;Strengthening;Both;10 reps;Seated General Exercises - Lower Extremity Ankle Circles/Pumps:  Both;Seated;10 reps;Strengthening;AROM Long Arc Quad: AROM;Both;Seated;10 reps;Strengthening Hip Flexion/Marching: AROM;Both;Seated;10 reps;Strengthening    General Comments        Pertinent Vitals/Pain Pain Assessment: No/denies pain    Home Living                      Prior Function            PT Goals (current goals can now be found in the care plan section) Acute Rehab PT Goals Patient Stated Goal: return home after rehab PT Goal Formulation: With patient Time For Goal Achievement: 04/20/21 Potential to Achieve Goals: Good Progress towards PT goals: Progressing toward goals    Frequency    Min 3X/week      PT Plan Current plan remains appropriate       AM-PAC PT "6 Clicks" Mobility   Outcome Measure  Help needed turning from your back to your side while in a flat bed without using bedrails?: A Little Help needed moving from lying on your back to sitting on the side of a flat bed without using bedrails?: A Little Help needed moving to and from a bed to a chair (including a wheelchair)?: A Lot Help needed standing up from a chair using your arms (e.g., wheelchair or bedside chair)?: A Lot Help needed to walk in hospital room?: A Lot Help needed climbing 3-5 steps with a railing? : A Lot 6 Click Score: 14    End of Session Equipment Utilized During Treatment: Gait belt Activity Tolerance: Patient tolerated treatment well;Patient limited by fatigue Patient left: with call bell/phone within reach;in bed;with bed alarm set;with family/visitor present Nurse Communication: Mobility status PT Visit Diagnosis: Unsteadiness on feet (R26.81);Muscle weakness (generalized) (M62.81);Other abnormalities of gait and mobility (R26.89)     Time: 4627-0350 PT Time Calculation (min) (ACUTE ONLY): 25 min  Charges:  $Therapeutic Exercise: 8-22 mins $Therapeutic Activity: 8-22 mins                    Floria Raveling. Hartnett-Rands, MS, PT Per [place] [phone]  Pamala Hurry  Hartnett-Rands 04/10/2021, 1:16 PM

## 2021-04-10 NOTE — Progress Notes (Addendum)
TRH night shift telemetry coverage note.  The patient was evaluated after the nursing staff reported that she was in atrial fibrillation with RVR with a heart rate as high as 190 bpm.  Apparently the patient was having a bowel movement when this happened.  However, review of the telemetry monitoring showed that she has had sinus arrhythmia and has been tachycardic at times.  She denied chest pain, palpitations, dizziness, diaphoresis or dyspnea.  She has not have palpitations at any other times to her knowledge.  She is also concerned about not being discharged today as she is anticipating to go to a facility around 1300 today, but I told her that her heart is back to NSR, that we will do an anticipated discharge echocardiogram and cardiology will see her in the morning as well, but we should not ignore this tachycardia episode.  04/10/21 04:23:56 Metoprolol 2.5 mg IVP was given a few minutes before  74 -- 19 142/88 Abnormal  Lying 94 % Room Air      04/10/21 03:49:22 98.3 F (36.8 C) 92 -- 18 153/94 Abnormal  Lying 92 % Room Air       General: No acute distress. Neck: Supple, no JVD. Lungs: CTA bilaterally. CV: S1-S2 irregularly irregular, then post metoprolol S1-S2 with a regular rhythm, no murmurs. Abdomen: Soft nontender. Extremities: Has a right mid pretibial hematoma.  There is no pitting edema. Neuro: Awake, alert and oriented x3, grossly nonfocal.  CHEST  1 VIEW   On: 04/05/2021 16:53 COMPARISON:  Head CT 05/17/2015  FINDINGS: Right chest port with tip in the SVC. Lung volumes are low. Heart is normal in size. Suspected retrocardiac hiatal hernia. Streaky opacity at both lung bases likely atelectasis. No pneumothorax or pleural effusion. Surgical hardware in the lower thoracic spine is partially included. No acute osseous abnormalities are seen.  IMPRESSION: 1. Low lung volumes with bibasilar atelectasis. No traumatic findings. 2. Suspected retrocardiac hiatal hernia. 3. Right  chest port with tip in the SVC.   Electronically Signed   By: Keith Rake M.D. My personal review also reveals a calcified aortic knob.      Assessment:  S/P SVT as high as 190 BPM Paroxysmal atrial fibrillation with RVR Sinus arrhythmia It is unknown if this is her first instance.  Had a sinus arrhythmia on 04/07/2021 evening. No previous history to her knowledge. Responded to metoprolol 2.5 mg IVP x1. Her CHA?DS?-VASc Score is at least 6. Age > 75 (+2). Female gender (+1). HTN history (+1). Type II DM (+1) +1. Calcified aortic knob on CXR (+1).  Plan: Anticoagulation deferred due to hematoma/conversion to NSR. Begin metoprolol succinate 12.5 mg p.o. daily. Metoprolol 2.5 mg IVP every 6 hours as needed. Check hemoglobin, magnesium, BMP. Optimize electrolytes as needed. Check echocardiogram and consult cardiology.  Tennis Must, MD.  About 45 minutes of critical care time were spent in during the process of this urgent event.   This document was prepared using Dragon voice recognition software and may contain some unintended transcription errors.

## 2021-04-10 NOTE — TOC Transition Note (Signed)
Transition of Care [facility]) - CM/SW Discharge Note   Patient Details  Name: Rita Thomas MRN: [id number] Date of Birth: May 16, 1932  [facility] CM/SW Contact:  Salome Arnt, LCSW Phone Number: 04/10/2021, 2:16 PM   Clinical Narrative:  Pt d/c today to [facility]. Pt requesting COVID booster per MD. RN notified. Pt ambulated 51' with PT today. LCSW spoke with pt's daughter about d/c. She will provide transportation. D/C summary sent to SNF. COVID negative 04/09/21. RN given number to call report.     Final next level of care: Skilled Nursing Facility Barriers to Discharge: Barriers Resolved   Patient Goals and CMS Choice Patient states their goals for this hospitalization and ongoing recovery are:: Go to SNF CMS Medicare.gov Compare Post Acute Care list provided to:: Patient Choice offered to / list presented to : Patient, Adult Children  Discharge Placement   Existing PASRR number confirmed : 04/10/21          Patient chooses bed at: Other - please specify in the comment section below: ([facility]) Patient to be transferred to facility by: family Name of family member notified: Bethena Roys- daughter Patient and family notified of of transfer: 04/10/21  Discharge Plan and Services In-house Referral: Clinical Social Work Discharge Planning Services: [employer] Consult Post Acute Care Choice: [facility]          DME Arranged: N/A                    Social Determinants of Health (SDOH) Interventions     Readmission Risk Interventions No flowsheet data found.

## 2021-04-10 NOTE — Progress Notes (Signed)
Patient discussed with primary team Dr Dyann Kief. EKG and tele review looks more like MAT as opposed to afib. Controlled on beta blocker currently. In absence of clear of afib along with recent call would not start anticoag. Would plan for outpatient monitor at f/u. No further inpatient cardiac plans at this time, we will arrange a f/u   Zandra Abts MD

## 2021-04-10 NOTE — Progress Notes (Signed)
Pt has discharge orders. Pt will be going to [facility], report called to [facility] the nurse and she has no further questions at this time. Pt's right upper port de-accessed and tele monitor removed. Pts daughter is bedside and has no further questions at this time. Daughter will be taking pt to facility herself. Discharge instructions given.

## 2021-04-10 NOTE — Progress Notes (Signed)
  Echocardiogram 2D Echocardiogram has been performed.  Rita Thomas 04/10/2021, 3:01 PM

## 2021-04-10 NOTE — Discharge Summary (Signed)
Physician Discharge Summary  Rita Thomas XBL:[id number] DOB: Feb 25, 1932 DOA: 04/05/2021  PCP: Pcp, No  Admit date: 04/05/2021 Discharge date: 04/10/2021  Time spent: 35 minutes  Recommendations for Outpatient Follow-up:  Repeat basic metabolic panel to follow close renal function Reassess blood pressure with further adjustment to antihypertensive agents as needed. Close monitoring of patient CBGs with further adjustment to hypoglycemia regimen as required.  Discharge Diagnoses:  Principal Problem:   AKI (acute kidney injury) ([place]) Active Problems:   Metastatic cancer to spine ([place])   Rhabdomyolysis   Hypomagnesemia   Type 2 diabetes with nephropathy ([place])   CKD stage 4 due to type 2 diabetes mellitus ([place])   Physical deconditioning   SVT (supraventricular tachycardia) ([place])   Discharge Condition: Stable and improved.  Discharge to skilled nursing facility for further care and rehabilitation.  CODE STATUS: DNR.  Diet recommendation: Heart healthy and modify carbohydrates diet.  Filed Weights   04/05/21 1359  Weight: 59 kg    History of present illness:  Rita Thomas is an 87 year old female with diabetes mellitus, hypertension, hyperlipidemia, stage IV breast cancer status post chemoradiation with metastasis to the spine is brought to the ED by EMS with complaints of altered mental status.  According to the patient's daughter who was in the room, the patient appeared to have slid off of the commode and was lying on the floor and stool and urine.  Apparently she remained there overnight and her daughter found her in the morning. In the ED she was noted to have a sodium of 132 and the creatinine that was 1.32 (above from 0.4 in 2014).  Total CK was 1594.  Hospital Course:  1-hyponatremia/acute kidney injury on chronic kidney disease secondary to rhabdomyolysis and dehydration. -Will review inpatient records GFR and prior creatinine levels suggesting chronic kidney disease a  stage IV. -Will continue avoiding nephrotoxic agents and maintain adequate hydration; no pill has been On hold at time of discharge. -Creatinine back to baseline at at this moment; will recommend repeat basic metabolic panel to assess stability and trend.  2-essential hypertension -Continue current antihypertensive agents -Follow vital signs -Heart healthy diet recommended.  3-type 2 diabetes with nephropathy -Most recent A1c 6.7 -Januvia dose has been changed to only 25 mg daily instead of 50.  Continue modified carbohydrate diet and closely follow CBGs. -Further adjustment to hypoglycemia regimen to be done as needed.  4-transient episode of SVT/MAT -No convincing evidence for atrial fibrillation at this time -Appreciate assistance and recommendation by cardiology service -Continue at discharge extended release dose of metoprolol -Patient will follow-up as an outpatient for further evaluation and management and future decisions on anticoagulation.  5-history of breast cancer with metastasis to the spine -Status post chemo, radiation, laminectomy for spinal cord compression at T11-T12 -Continue Aromasin -Continue patient follow-up with oncology service.  6-hypothyroidism -Continue Synthroid.  7-physical deconditioning -Patient seen by physical therapy with recommendations for skilled nursing facility at discharge -Patient and family in agreement -Patient will be discharged to skilled nursing facility [facility]) for further care and rehab.  Procedures: See below for x-ray reports.  Consultations: Cardiology service  Discharge Exam: Vitals:   04/10/21 0423 04/10/21 1347  BP: (!) 142/88 (!) 126/30  Pulse: 74 74  Resp: 19 18  Temp:  97.6 F (36.4 C)  SpO2: 94% 98%    General: Alert, awake and oriented x3; no chest pain, no palpitations, no nausea, no vomiting.  Feeling weak and tired. Cardiovascular: Rate controlled, no rubs, no gallops,  positive Solik murmur.   No JVD on exam. Respiratory: Good air movement bilaterally; no requiring oxygen supplementation.  No using accessory muscle. Abdomen: Soft, nontender, nondistended, positive bowel sounds Extremities: No cyanosis or clubbing.  Discharge Instructions   Discharge Instructions     Amb referral to [facility]   Complete by: As directed    Diet - low sodium heart healthy   Complete by: As directed    Diet Carb Modified   Complete by: As directed    Discharge instructions   Complete by: As directed    Physical therapy and rehabilitation as per the skilled nursing facility protocol Take medications as prescribed Maintain adequate hydration Outpatient follow-up with cardiology service as instructed. Follow-up with PCP in 2 weeks after discharge from the skilled nursing facility. Follow heart healthy diet. Check weight on daily basis and assess patient's volume status.      Allergies as of 04/10/2021       Reactions   Morphine And Related Other (See Comments)   "Made me crazy"        Medication List     STOP taking these medications    lisinopril 20 MG tablet Commonly known as: ZESTRIL       TAKE these medications    acetaminophen 325 MG tablet Commonly known as: TYLENOL Take 100 mg by mouth 2 (two) times daily.   atorvastatin 10 MG tablet Commonly known as: LIPITOR Take 10 mg by mouth daily.   CALTRATE 600 PO Take 1 tablet by mouth daily.   exemestane 25 MG tablet Commonly known as: AROMASIN Take 25 mg by mouth daily.   levothyroxine 50 MCG tablet Commonly known as: SYNTHROID Take 50 mcg by mouth daily.   lidocaine 5 % Commonly known as: LIDODERM Place 1 patch onto the skin daily. Remove & Discard patch within 12 hours or as directed by MD   metoprolol succinate 25 MG 24 hr tablet Commonly known as: TOPROL-XL Take 0.5 tablets (12.5 mg total) by mouth daily. Start taking on: April 11, 2021   multivitamin with minerals Tabs tablet Take 1 tablet by  mouth daily.   pantoprazole 40 MG tablet Commonly known as: PROTONIX Take 40 mg by mouth daily.   sitaGLIPtin 25 MG tablet Commonly known as: JANUVIA Take 1 tablet (25 mg total) by mouth daily. What changed:  medication strength how much to take       Allergies  Allergen Reactions   Morphine And Related Other (See Comments)    "Made me crazy"    The results of significant diagnostics from this hospitalization (including imaging, microbiology, ancillary and laboratory) are listed below for reference.    Significant Diagnostic Studies: DG Chest 1 View  Result Date: 04/05/2021 CLINICAL DATA:  Fall today. EXAM: CHEST  1 VIEW COMPARISON:  Head CT 05/17/2015 FINDINGS: Right chest port with tip in the SVC. Lung volumes are low. Heart is normal in size. Suspected retrocardiac hiatal hernia. Streaky opacity at both lung bases likely atelectasis. No pneumothorax or pleural effusion. Surgical hardware in the lower thoracic spine is partially included. No acute osseous abnormalities are seen. IMPRESSION: 1. Low lung volumes with bibasilar atelectasis. No traumatic findings. 2. Suspected retrocardiac hiatal hernia. 3. Right chest port with tip in the SVC. Electronically Signed   By: Keith Rake M.D.   On: 04/05/2021 16:53   DG Tibia/Fibula Right  Result Date: 04/05/2021 CLINICAL DATA:  Fall today.  Right leg bruising and swelling. EXAM: RIGHT TIBIA AND FIBULA -  2 VIEW COMPARISON:  None. FINDINGS: Cortical margins of the tibia and fibula are intact. There is no evidence of fracture or other focal bone lesions. Knee and ankle alignment are maintained. Generalized soft tissue edema. IMPRESSION: Generalized soft tissue edema. No fracture or acute osseous abnormality. Electronically Signed   By: Keith Rake M.D.   On: 04/05/2021 16:54   CT Head Wo Contrast  Result Date: 04/05/2021 CLINICAL DATA:  Confusion EXAM: CT HEAD WITHOUT CONTRAST TECHNIQUE: Contiguous axial images were obtained from  the base of the skull through the vertex without intravenous contrast. COMPARISON:  CT brain report 08/12/2018 FINDINGS: Brain: No acute territorial infarction, hemorrhage, or intracranial mass. Moderate atrophy and chronic small vessel ischemic changes of the white matter. The ventricles are nonenlarged Vascular: No hyperdense vessels.  Carotid vascular calcification Skull: Normal. Negative for fracture or focal lesion. Sinuses/Orbits: No acute finding. Other: None IMPRESSION: 1. No CT evidence for acute intracranial abnormality. 2. Atrophy and chronic small vessel ischemic change of the white matter Electronically Signed   By: Donavan Foil M.D.   On: 04/05/2021 16:04    Microbiology: Recent Results (from the past 240 hour(s))  Resp Panel by RT-PCR (Flu A&B, Covid) Nasopharyngeal Swab     Status: None   Collection Time: 04/05/21  2:52 PM   Specimen: Nasopharyngeal Swab; Nasopharyngeal(NP) swabs in vial transport medium  Result Value Ref Range Status   SARS Coronavirus 2 by RT PCR NEGATIVE NEGATIVE Final    Comment: (NOTE) SARS-CoV-2 target nucleic acids are NOT DETECTED.  The SARS-CoV-2 RNA is generally detectable in upper respiratory specimens during the acute phase of infection. The lowest concentration of SARS-CoV-2 viral copies this assay can detect is 138 copies/mL. A negative result does not preclude SARS-Cov-2 infection and should not be used as the sole basis for treatment or other patient management decisions. A negative result may occur with  improper specimen collection/handling, submission of specimen other than nasopharyngeal swab, presence of viral mutation(s) within the areas targeted by this assay, and inadequate number of viral copies(<138 copies/mL). A negative result must be combined with clinical observations, patient history, and epidemiological information. The expected result is Negative.  Fact Sheet for Patients:  EntrepreneurPulse.com.au  Fact  Sheet for Healthcare Providers:  IncredibleEmployment.be  This test is no t yet approved or cleared by the [place] FDA and  has been authorized for detection and/or diagnosis of SARS-CoV-2 by FDA under an Emergency Use Authorization (EUA). This EUA will remain  in effect (meaning this test can be used) for the duration of the COVID-19 declaration under Section 564(b)(1) of the Act, 21 U.S.C.section 360bbb-3(b)(1), unless the authorization is terminated  or revoked sooner.       Influenza A by PCR NEGATIVE NEGATIVE Final   Influenza B by PCR NEGATIVE NEGATIVE Final    Comment: (NOTE) The Xpert Xpress SARS-CoV-2/FLU/RSV plus assay is intended as an aid in the diagnosis of influenza from Nasopharyngeal swab specimens and should not be used as a sole basis for treatment. Nasal washings and aspirates are unacceptable for Xpert Xpress SARS-CoV-2/FLU/RSV testing.  Fact Sheet for Patients: EntrepreneurPulse.com.au  Fact Sheet for Healthcare Providers: IncredibleEmployment.be  This test is not yet approved or cleared by the [place] FDA and has been authorized for detection and/or diagnosis of SARS-CoV-2 by FDA under an Emergency Use Authorization (EUA). This EUA will remain in effect (meaning this test can be used) for the duration of the COVID-19 declaration under Section 564(b)(1) of the Act, 21  U.S.C. section 360bbb-3(b)(1), unless the authorization is terminated or revoked.  Performed at [facility], [address], [place], [place] [address]   SARS CORONAVIRUS 2 (TAT 6-24 HRS) Nasopharyngeal Nasopharyngeal Swab     Status: None   Collection Time: 04/09/21  5:53 PM   Specimen: Nasopharyngeal Swab  Result Value Ref Range Status   SARS Coronavirus 2 NEGATIVE NEGATIVE Final    Comment: (NOTE) SARS-CoV-2 target nucleic acids are NOT DETECTED.  The SARS-CoV-2 RNA is generally detectable in upper and  lower respiratory specimens during the acute phase of infection. Negative results do not preclude SARS-CoV-2 infection, do not rule out co-infections with other pathogens, and should not be used as the sole basis for treatment or other patient management decisions. Negative results must be combined with clinical observations, patient history, and epidemiological information. The expected result is Negative.  Fact Sheet for Patients: SugarRoll.be  Fact Sheet for Healthcare Providers: https://www.woods-mathews.com/  This test is not yet approved or cleared by the [place] FDA and  has been authorized for detection and/or diagnosis of SARS-CoV-2 by FDA under an Emergency Use Authorization (EUA). This EUA will remain  in effect (meaning this test can be used) for the duration of the COVID-19 declaration under Se ction 564(b)(1) of the Act, 21 U.S.C. section 360bbb-3(b)(1), unless the authorization is terminated or revoked sooner.  Performed at [facility], [place] [address], [place], [place] [id number]      Labs: Basic Metabolic Panel: Recent Labs  Lab 04/05/21 1429 04/06/21 0425 04/07/21 0430 04/07/21 1824 04/09/21 0345 04/10/21 0511  NA 132* 135 133* 133* 136 135  K 4.1 4.0 3.7 4.2 3.6 4.2  CL 96* 103 104 103 103 100  CO2 25 30 24 24 24 28   GLUCOSE 160* 115* 128* 152* 113* 131*  BUN 39* 28* 28* 24* 16 19  CREATININE 1.32* 1.04* 0.90 1.10* 0.81 1.04*  CALCIUM 9.3 7.8* 7.3* 7.6* 7.8* 8.2*  MG 1.3* 2.5*  --   --   --  1.5*   Liver Function Tests: Recent Labs  Lab 04/06/21 0425 04/07/21 1824  AST 51* 37  ALT 18 19  ALKPHOS 37* 38  BILITOT 0.6 0.5  PROT 6.0* 6.0*  ALBUMIN 3.3* 3.3*   CBC: Recent Labs  Lab 04/05/21 1429 04/10/21 0511  WBC 7.4  --   HGB 10.8* 9.0*  HCT 31.4* 28.1*  MCV 98.1  --   PLT 218  --    Cardiac Enzymes: Recent Labs  Lab 04/05/21 1429 04/06/21 0425 04/07/21 0430  CKTOTAL 1,594*  1,187* 595*   CBG: Recent Labs  Lab 04/09/21 1058 04/09/21 1611 04/09/21 1959 04/10/21 0733 04/10/21 1121  GLUCAP 107* 120* 193* 111* 312*    Signed:  Barton Dubois MD.  [facility] 04/10/2021, 2:00 PM

## 2021-04-10 NOTE — Care Management Important Message (Deleted)
Important Message  Patient Details  Name: Rita Thomas MRN: [id number] Date of Birth: [birth year]   Medicare Important Message Given:  Yes     Tommy Medal 04/10/2021, 1:35 PM

## 2021-04-10 NOTE — Care Management Important Message (Signed)
Important Message  Patient Details  Name: Rita Thomas MRN: [id number] Date of Birth: April 10, 1932   Medicare Important Message Given:  Yes     Tommy Medal 04/10/2021, 1:31 PM

## 2021-04-11 DIAGNOSIS — I10 Essential (primary) hypertension: Secondary | ICD-10-CM | POA: Diagnosis not present

## 2021-04-11 DIAGNOSIS — E1142 Type 2 diabetes mellitus with diabetic polyneuropathy: Secondary | ICD-10-CM | POA: Diagnosis not present

## 2021-04-11 DIAGNOSIS — R531 Weakness: Secondary | ICD-10-CM | POA: Diagnosis not present

## 2021-04-11 DIAGNOSIS — M5459 Other low back pain: Secondary | ICD-10-CM | POA: Diagnosis not present

## 2021-05-15 DIAGNOSIS — E1142 Type 2 diabetes mellitus with diabetic polyneuropathy: Secondary | ICD-10-CM | POA: Diagnosis not present

## 2021-05-15 DIAGNOSIS — I10 Essential (primary) hypertension: Secondary | ICD-10-CM | POA: Diagnosis not present

## 2021-05-15 DIAGNOSIS — M5459 Other low back pain: Secondary | ICD-10-CM | POA: Diagnosis not present

## 2021-05-15 DIAGNOSIS — R531 Weakness: Secondary | ICD-10-CM | POA: Diagnosis not present

## 2021-05-22 DIAGNOSIS — I10 Essential (primary) hypertension: Secondary | ICD-10-CM | POA: Diagnosis not present

## 2021-05-22 DIAGNOSIS — E039 Hypothyroidism, unspecified: Secondary | ICD-10-CM | POA: Diagnosis not present

## 2021-05-22 DIAGNOSIS — M6282 Rhabdomyolysis: Secondary | ICD-10-CM | POA: Diagnosis not present

## 2021-05-22 DIAGNOSIS — Z9181 History of falling: Secondary | ICD-10-CM | POA: Diagnosis not present

## 2021-05-22 DIAGNOSIS — N184 Chronic kidney disease, stage 4 (severe): Secondary | ICD-10-CM | POA: Diagnosis not present

## 2021-05-22 DIAGNOSIS — I471 Supraventricular tachycardia: Secondary | ICD-10-CM | POA: Diagnosis not present

## 2021-05-22 DIAGNOSIS — C7951 Secondary malignant neoplasm of bone: Secondary | ICD-10-CM | POA: Diagnosis not present

## 2021-05-22 DIAGNOSIS — E785 Hyperlipidemia, unspecified: Secondary | ICD-10-CM | POA: Diagnosis not present

## 2021-05-22 DIAGNOSIS — E1122 Type 2 diabetes mellitus with diabetic chronic kidney disease: Secondary | ICD-10-CM | POA: Diagnosis not present

## 2021-05-22 DIAGNOSIS — N179 Acute kidney failure, unspecified: Secondary | ICD-10-CM | POA: Diagnosis not present

## 2021-05-22 DIAGNOSIS — Z853 Personal history of malignant neoplasm of breast: Secondary | ICD-10-CM | POA: Diagnosis not present

## 2021-05-22 DIAGNOSIS — Z7984 Long term (current) use of oral hypoglycemic drugs: Secondary | ICD-10-CM | POA: Diagnosis not present

## 2021-05-25 DIAGNOSIS — N184 Chronic kidney disease, stage 4 (severe): Secondary | ICD-10-CM | POA: Diagnosis not present

## 2021-05-25 DIAGNOSIS — E1122 Type 2 diabetes mellitus with diabetic chronic kidney disease: Secondary | ICD-10-CM | POA: Diagnosis not present

## 2021-05-25 DIAGNOSIS — M6282 Rhabdomyolysis: Secondary | ICD-10-CM | POA: Diagnosis not present

## 2021-05-25 DIAGNOSIS — N179 Acute kidney failure, unspecified: Secondary | ICD-10-CM | POA: Diagnosis not present

## 2021-05-25 DIAGNOSIS — I10 Essential (primary) hypertension: Secondary | ICD-10-CM | POA: Diagnosis not present

## 2021-05-25 DIAGNOSIS — C7951 Secondary malignant neoplasm of bone: Secondary | ICD-10-CM | POA: Diagnosis not present

## 2021-05-27 ENCOUNTER — Inpatient Hospital Stay (HOSPITAL_COMMUNITY)
Admission: EM | Admit: 2021-05-27 | Discharge: 2021-05-31 | DRG: 640 | Disposition: A | Discharge status: Skilled Nursing Facility | Payer: Medicare Other | Attending: Internal Medicine | Admitting: Internal Medicine

## 2021-05-27 ENCOUNTER — Emergency Department (HOSPITAL_COMMUNITY): Payer: Medicare Other

## 2021-05-27 ENCOUNTER — Other Ambulatory Visit: Payer: Self-pay

## 2021-05-27 ENCOUNTER — Encounter (HOSPITAL_COMMUNITY): Payer: Self-pay

## 2021-05-27 DIAGNOSIS — Z20822 Contact with and (suspected) exposure to covid-19: Secondary | ICD-10-CM | POA: Diagnosis present

## 2021-05-27 DIAGNOSIS — Z9013 Acquired absence of bilateral breasts and nipples: Secondary | ICD-10-CM | POA: Diagnosis not present

## 2021-05-27 DIAGNOSIS — L89321 Pressure ulcer of left buttock, stage 1: Secondary | ICD-10-CM | POA: Diagnosis present

## 2021-05-27 DIAGNOSIS — C7951 Secondary malignant neoplasm of bone: Secondary | ICD-10-CM | POA: Diagnosis present

## 2021-05-27 DIAGNOSIS — E039 Hypothyroidism, unspecified: Secondary | ICD-10-CM | POA: Diagnosis present

## 2021-05-27 DIAGNOSIS — L89311 Pressure ulcer of right buttock, stage 1: Secondary | ICD-10-CM | POA: Diagnosis present

## 2021-05-27 DIAGNOSIS — I471 Supraventricular tachycardia: Secondary | ICD-10-CM | POA: Diagnosis present

## 2021-05-27 DIAGNOSIS — K219 Gastro-esophageal reflux disease without esophagitis: Secondary | ICD-10-CM | POA: Diagnosis present

## 2021-05-27 DIAGNOSIS — E1122 Type 2 diabetes mellitus with diabetic chronic kidney disease: Secondary | ICD-10-CM | POA: Diagnosis not present

## 2021-05-27 DIAGNOSIS — I482 Chronic atrial fibrillation, unspecified: Secondary | ICD-10-CM | POA: Diagnosis present

## 2021-05-27 DIAGNOSIS — Z66 Do not resuscitate: Secondary | ICD-10-CM | POA: Diagnosis present

## 2021-05-27 DIAGNOSIS — M6281 Muscle weakness (generalized): Secondary | ICD-10-CM | POA: Diagnosis not present

## 2021-05-27 DIAGNOSIS — Z9221 Personal history of antineoplastic chemotherapy: Secondary | ICD-10-CM

## 2021-05-27 DIAGNOSIS — Z8616 Personal history of COVID-19: Secondary | ICD-10-CM

## 2021-05-27 DIAGNOSIS — L8990 Pressure ulcer of unspecified site, unspecified stage: Secondary | ICD-10-CM | POA: Diagnosis present

## 2021-05-27 DIAGNOSIS — Z981 Arthrodesis status: Secondary | ICD-10-CM | POA: Diagnosis not present

## 2021-05-27 DIAGNOSIS — E11649 Type 2 diabetes mellitus with hypoglycemia without coma: Secondary | ICD-10-CM | POA: Diagnosis not present

## 2021-05-27 DIAGNOSIS — R0600 Dyspnea, unspecified: Secondary | ICD-10-CM

## 2021-05-27 DIAGNOSIS — R112 Nausea with vomiting, unspecified: Secondary | ICD-10-CM | POA: Diagnosis present

## 2021-05-27 DIAGNOSIS — R2689 Other abnormalities of gait and mobility: Secondary | ICD-10-CM | POA: Diagnosis not present

## 2021-05-27 DIAGNOSIS — R9431 Abnormal electrocardiogram [ECG] [EKG]: Secondary | ICD-10-CM | POA: Diagnosis not present

## 2021-05-27 DIAGNOSIS — R531 Weakness: Secondary | ICD-10-CM | POA: Diagnosis not present

## 2021-05-27 DIAGNOSIS — I10 Essential (primary) hypertension: Secondary | ICD-10-CM | POA: Diagnosis present

## 2021-05-27 DIAGNOSIS — E119 Type 2 diabetes mellitus without complications: Secondary | ICD-10-CM | POA: Diagnosis present

## 2021-05-27 DIAGNOSIS — R0602 Shortness of breath: Secondary | ICD-10-CM

## 2021-05-27 DIAGNOSIS — N184 Chronic kidney disease, stage 4 (severe): Secondary | ICD-10-CM | POA: Diagnosis not present

## 2021-05-27 DIAGNOSIS — R110 Nausea: Secondary | ICD-10-CM | POA: Diagnosis not present

## 2021-05-27 DIAGNOSIS — E871 Hypo-osmolality and hyponatremia: Secondary | ICD-10-CM | POA: Diagnosis present

## 2021-05-27 DIAGNOSIS — R450 Nervousness: Secondary | ICD-10-CM | POA: Diagnosis not present

## 2021-05-27 DIAGNOSIS — F0390 Unspecified dementia without behavioral disturbance: Secondary | ICD-10-CM | POA: Diagnosis present

## 2021-05-27 DIAGNOSIS — Z853 Personal history of malignant neoplasm of breast: Secondary | ICD-10-CM

## 2021-05-27 DIAGNOSIS — G459 Transient cerebral ischemic attack, unspecified: Secondary | ICD-10-CM | POA: Diagnosis not present

## 2021-05-27 DIAGNOSIS — E1121 Type 2 diabetes mellitus with diabetic nephropathy: Secondary | ICD-10-CM | POA: Diagnosis not present

## 2021-05-27 DIAGNOSIS — R918 Other nonspecific abnormal finding of lung field: Secondary | ICD-10-CM | POA: Diagnosis not present

## 2021-05-27 DIAGNOSIS — I700 Atherosclerosis of aorta: Secondary | ICD-10-CM | POA: Diagnosis not present

## 2021-05-27 DIAGNOSIS — D6489 Other specified anemias: Secondary | ICD-10-CM | POA: Diagnosis present

## 2021-05-27 DIAGNOSIS — L89301 Pressure ulcer of unspecified buttock, stage 1: Secondary | ICD-10-CM | POA: Diagnosis not present

## 2021-05-27 DIAGNOSIS — G9341 Metabolic encephalopathy: Secondary | ICD-10-CM | POA: Diagnosis present

## 2021-05-27 DIAGNOSIS — Z803 Family history of malignant neoplasm of breast: Secondary | ICD-10-CM | POA: Diagnosis not present

## 2021-05-27 DIAGNOSIS — R0689 Other abnormalities of breathing: Secondary | ICD-10-CM

## 2021-05-27 DIAGNOSIS — E785 Hyperlipidemia, unspecified: Secondary | ICD-10-CM | POA: Diagnosis present

## 2021-05-27 DIAGNOSIS — Z923 Personal history of irradiation: Secondary | ICD-10-CM | POA: Diagnosis not present

## 2021-05-27 DIAGNOSIS — R1111 Vomiting without nausea: Secondary | ICD-10-CM | POA: Diagnosis not present

## 2021-05-27 DIAGNOSIS — R41841 Cognitive communication deficit: Secondary | ICD-10-CM | POA: Diagnosis not present

## 2021-05-27 DIAGNOSIS — Z885 Allergy status to narcotic agent status: Secondary | ICD-10-CM | POA: Diagnosis not present

## 2021-05-27 DIAGNOSIS — Z79890 Hormone replacement therapy (postmenopausal): Secondary | ICD-10-CM

## 2021-05-27 DIAGNOSIS — Z79899 Other long term (current) drug therapy: Secondary | ICD-10-CM

## 2021-05-27 DIAGNOSIS — G319 Degenerative disease of nervous system, unspecified: Secondary | ICD-10-CM | POA: Diagnosis not present

## 2021-05-27 DIAGNOSIS — D539 Nutritional anemia, unspecified: Secondary | ICD-10-CM | POA: Diagnosis not present

## 2021-05-27 HISTORY — DX: Hypothyroidism, unspecified: E03.9

## 2021-05-27 LAB — BASIC METABOLIC PANEL
Anion gap: 12 (ref 5–15)
BUN: 17 mg/dL (ref 8–23)
CO2: 21 mmol/L — ABNORMAL LOW (ref 22–32)
Calcium: 8 mg/dL — ABNORMAL LOW (ref 8.9–10.3)
Chloride: 82 mmol/L — ABNORMAL LOW (ref 98–111)
Creatinine, Ser: 0.87 mg/dL (ref 0.44–1.00)
GFR, Estimated: >60 mL/min (ref >60)
Glucose, Bld: 145 mg/dL — ABNORMAL HIGH (ref 70–99)
Potassium: 4.3 mmol/L (ref 3.5–5.1)
Sodium: 115 mmol/L — CL (ref 135–145)

## 2021-05-27 LAB — COMPREHENSIVE METABOLIC PANEL
ALT: 15 U/L (ref 0–44)
AST: 27 U/L (ref 15–41)
Albumin: 4 g/dL (ref 3.5–5.0)
Alkaline Phosphatase: 56 U/L (ref 38–126)
Anion gap: 10 (ref 5–15)
BUN: 19 mg/dL (ref 8–23)
CO2: 27 mmol/L (ref 22–32)
Calcium: 8.9 mg/dL (ref 8.9–10.3)
Chloride: 77 mmol/L — ABNORMAL LOW (ref 98–111)
Creatinine, Ser: 0.95 mg/dL (ref 0.44–1.00)
GFR, Estimated: 58 mL/min — ABNORMAL LOW (ref >60)
Glucose, Bld: 174 mg/dL — ABNORMAL HIGH (ref 70–99)
Potassium: 4.3 mmol/L (ref 3.5–5.1)
Sodium: 114 mmol/L — CL (ref 135–145)
Total Bilirubin: 0.9 mg/dL (ref 0.3–1.2)
Total Protein: 7.5 g/dL (ref 6.5–8.1)

## 2021-05-27 LAB — CBC WITH DIFFERENTIAL/PLATELET
Abs Immature Granulocytes: 0.04 10*3/uL (ref 0.00–0.07)
Basophils Absolute: 0 10*3/uL (ref 0.0–0.1)
Basophils Relative: 0 %
Eosinophils Absolute: 0.1 10*3/uL (ref 0.0–0.5)
Eosinophils Relative: 3 %
HCT: 32 % — ABNORMAL LOW (ref 36.0–46.0)
Hemoglobin: 11 g/dL — ABNORMAL LOW (ref 12.0–15.0)
Immature Granulocytes: 1 %
Lymphocytes Relative: 10 %
Lymphs Abs: 0.5 10*3/uL — ABNORMAL LOW (ref 0.7–4.0)
MCH: 30.1 pg (ref 26.0–34.0)
MCHC: 34.4 g/dL (ref 30.0–36.0)
MCV: 87.7 fL (ref 80.0–100.0)
Monocytes Absolute: 0.4 10*3/uL (ref 0.1–1.0)
Monocytes Relative: 8 %
Neutro Abs: 4.1 10*3/uL (ref 1.7–7.7)
Neutrophils Relative %: 78 %
Platelets: 266 10*3/uL (ref 150–400)
RBC: 3.65 MIL/uL — ABNORMAL LOW (ref 3.87–5.11)
RDW: 11.9 % (ref 11.5–15.5)
WBC: 5.2 10*3/uL (ref 4.0–10.5)
nRBC: 0 % (ref 0.0–0.2)

## 2021-05-27 LAB — URINALYSIS, ROUTINE W REFLEX MICROSCOPIC
Bilirubin Urine: NEGATIVE
Glucose, UA: 50 mg/dL — AB
Ketones, ur: 5 mg/dL — AB
Leukocytes,Ua: NEGATIVE
Nitrite: NEGATIVE
Protein, ur: NEGATIVE mg/dL
Specific Gravity, Urine: 1.006 (ref 1.005–1.030)
pH: 7 (ref 5.0–8.0)

## 2021-05-27 LAB — RESP PANEL BY RT-PCR (FLU A&B, COVID) ARPGX2
Influenza A by PCR: NEGATIVE
Influenza B by PCR: NEGATIVE
SARS Coronavirus 2 by RT PCR: NEGATIVE

## 2021-05-27 LAB — TSH: TSH: 0.812 u[IU]/mL (ref 0.350–4.500)

## 2021-05-27 LAB — SODIUM: Sodium: 115 mmol/L — CL (ref 135–145)

## 2021-05-27 LAB — CREATININE, URINE, RANDOM: Creatinine, Urine: 21.02 mg/dL

## 2021-05-27 LAB — LIPASE, BLOOD: Lipase: 23 U/L (ref 11–51)

## 2021-05-27 LAB — SODIUM, URINE, RANDOM: Sodium, Ur: 94 mmol/L

## 2021-05-27 MED ORDER — INSULIN DETEMIR 100 UNIT/ML ~~LOC~~ SOLN
10.0000 [IU] | Freq: Every day | SUBCUTANEOUS | Status: DC
Start: 2021-05-27 — End: 2021-05-30
  Administered 2021-05-28 – 2021-05-29 (×3): 10 [IU] via SUBCUTANEOUS
  Filled 2021-05-27 (×4): qty 0.1

## 2021-05-27 MED ORDER — ONDANSETRON HCL 4 MG/2ML IJ SOLN
4.0000 mg | Freq: Once | INTRAMUSCULAR | Status: DC
  Filled 2021-05-27: qty 2

## 2021-05-27 MED ORDER — ATORVASTATIN CALCIUM 10 MG PO TABS
10.0000 mg | ORAL_TABLET | Freq: Every day | ORAL | Status: DC
  Administered 2021-05-28 – 2021-05-31 (×4): 10 mg via ORAL
  Filled 2021-05-27 (×4): qty 1

## 2021-05-27 MED ORDER — EXEMESTANE 25 MG PO TABS
25.0000 mg | ORAL_TABLET | Freq: Every day | ORAL | Status: DC
  Administered 2021-05-28 – 2021-05-31 (×4): 25 mg via ORAL
  Filled 2021-05-27 (×6): qty 1

## 2021-05-27 MED ORDER — PANTOPRAZOLE SODIUM 40 MG PO TBEC
40.0000 mg | DELAYED_RELEASE_TABLET | Freq: Every day | ORAL | Status: DC
  Administered 2021-05-28 – 2021-05-31 (×4): 40 mg via ORAL
  Filled 2021-05-27 (×4): qty 1

## 2021-05-27 MED ORDER — INSULIN ASPART 100 UNIT/ML IJ SOLN: 0.0000 [IU] | Freq: Every day | INTRAMUSCULAR | Status: DC

## 2021-05-27 MED ORDER — SODIUM CHLORIDE 0.9 % IV SOLN: INTRAVENOUS | Status: DC

## 2021-05-27 MED ORDER — ONDANSETRON HCL 4 MG/2ML IJ SOLN: 4.0000 mg | Freq: Four times a day (QID) | INTRAMUSCULAR | Status: DC | PRN

## 2021-05-27 MED ORDER — ENOXAPARIN SODIUM 40 MG/0.4ML IJ SOSY
40.0000 mg | PREFILLED_SYRINGE | INTRAMUSCULAR | Status: DC
  Administered 2021-05-28 – 2021-05-30 (×4): 40 mg via SUBCUTANEOUS
  Filled 2021-05-27 (×4): qty 0.4

## 2021-05-27 MED ORDER — SODIUM CHLORIDE 0.9 % IV BOLUS
1000.0000 mL | Freq: Once | INTRAVENOUS | Status: AC
  Administered 2021-05-27: 1000 mL via INTRAVENOUS

## 2021-05-27 MED ORDER — CHLORHEXIDINE GLUCONATE CLOTH 2 % EX PADS
6.0000 | MEDICATED_PAD | Freq: Every day | CUTANEOUS | Status: DC
  Administered 2021-05-29 – 2021-05-31 (×3): 6 via TOPICAL

## 2021-05-27 MED ORDER — ONDANSETRON HCL 4 MG PO TABS: 4.0000 mg | ORAL_TABLET | Freq: Four times a day (QID) | ORAL | Status: DC | PRN

## 2021-05-27 MED ORDER — LEVOTHYROXINE SODIUM 50 MCG PO TABS
50.0000 ug | ORAL_TABLET | Freq: Every day | ORAL | Status: DC
  Administered 2021-05-28 – 2021-05-31 (×4): 50 ug via ORAL
  Filled 2021-05-27: qty 2
  Filled 2021-05-27: qty 1
  Filled 2021-05-27 (×2): qty 2

## 2021-05-27 MED ORDER — INSULIN ASPART 100 UNIT/ML IJ SOLN
0.0000 [IU] | Freq: Three times a day (TID) | INTRAMUSCULAR | Status: DC
  Administered 2021-05-28 (×2): 2 [IU] via SUBCUTANEOUS
  Administered 2021-05-28: 1 [IU] via SUBCUTANEOUS
  Administered 2021-05-29: 2 [IU] via SUBCUTANEOUS
  Administered 2021-05-30 – 2021-05-31 (×2): 1 [IU] via SUBCUTANEOUS

## 2021-05-27 MED ORDER — METOPROLOL SUCCINATE ER 25 MG PO TB24
12.5000 mg | ORAL_TABLET | Freq: Every day | ORAL | Status: DC
  Administered 2021-05-28 – 2021-05-29 (×2): 12.5 mg via ORAL
  Filled 2021-05-27 (×2): qty 1

## 2021-05-27 MED ORDER — SODIUM CHLORIDE 3 % IV SOLN
INTRAVENOUS | Status: DC
  Filled 2021-05-27 (×4): qty 500

## 2021-05-27 NOTE — ED Triage Notes (Addendum)
Pt arrived via EMS. Complaints of weakness that started last night. N/V started this morning. Pt states she vomited x7. COVID + 05/08/21.

## 2021-05-27 NOTE — ED Notes (Signed)
Pt taken to CT.

## 2021-05-27 NOTE — H&P (Addendum)
History and Physical  Rita Thomas PZW:[id number] DOB: [birth year] DOA: 05/27/2021  Referring physician: Rayna Sexton, PA-C, EDP PCP: Neale Burly, MD  Outpatient Specialists:  Patient Coming From: home  Chief Complaint: weakness, n/v  HPI: Rita Thomas is a 87 y.o. female with a history of breast cancer with metastatic cancer to the spine status post chemotherapy and radiation, diabetes, hypothyroidism, hypertension, GERD.  She presents due to weakness, nausea and vomiting and diarrhea.  Yesterday, she had been feeling fine without any problems.  Today, she started having difficulty with ambulation and had multiple episodes of nausea and vomiting and an episode of diarrhea.  Denies fevers, chills, chest pain, shortness of breath, abdominal pain.  She came to the hospital for evaluation as things were worsening.  She was intolerant of oral food and liquids, which appeared to make her symptoms worse.    She was recently in rehab and had tested positive for COVID.  She had been treated there although the patient and her daughter do not know what she had received.  Emergency Department Course: Labs show hyponatremia with a sodium of 114.  CBC normal, TSH normal, COVID-negative, abdominal with chest x-ray negative  Review of Systems:   Pt denies any fevers, chills, constipation, abdominal pain, shortness of breath, dyspnea on exertion, orthopnea, cough, wheezing, palpitations, headache, vision changes, lightheadedness, dizziness, melena, rectal bleeding.  Review of systems are otherwise negative  Past Medical History:  Diagnosis Date   Breast cancer ([place]) 2006   Bilateral breast ca with mets to spine   Diabetes mellitus without complication (HCC)    GERD (gastroesophageal reflux disease)    H/O hiatal hernia    Hypertension    Hypothyroidism 05/27/2021   Metastasis to spinal cord [facility])    Metastatic cancer to spine ([place]) 03/13/2013   S/P chemotherapy, time since greater than 12  weeks    S/P radiation therapy    Past Surgical History:  Procedure Laterality Date   EYE SURGERY  1995   cataracts bilaterally   MASTECTOMY  2006   POSTERIOR LUMBAR FUSION 4 LEVEL N/A 03/06/2013   Procedure: POSTERIOR LUMBAR FUSION 4 LEVEL;  Surgeon: Winfield Cunas, MD;  Location: [facility];  Service: Neurosurgery;  Laterality: N/A;  T9-L3 posterior fusion with posterolateral arthrodesis and posterior segmental instrumentation   Social History:  reports that she has never smoked. She has never used smokeless tobacco. She reports that she does not drink alcohol and does not use drugs. Patient lives at home  Allergies  Allergen Reactions   Morphine And Related Other (See Comments)    "Made me crazy"    Family History  Problem Relation Age of Onset   Cancer Sister    Cancer Brother    Cancer Sister        breast      Prior to Admission medications   Medication Sig Start Date End Date Taking? Authorizing Provider  acetaminophen (TYLENOL) 325 MG tablet Take 100 mg by mouth 2 (two) times daily.   Yes [provider]  atorvastatin (LIPITOR) 10 MG tablet Take 10 mg by mouth daily. 12/20/17  Yes [provider]  Calcium Carbonate (CALTRATE 600 PO) Take 1 tablet by mouth daily.   Yes [provider]  exemestane (AROMASIN) 25 MG tablet Take 25 mg by mouth daily. 02/24/21  Yes [provider]  levothyroxine (SYNTHROID) 50 MCG tablet Take 50 mcg by mouth daily. 02/24/21  Yes [provider]  lidocaine (LIDODERM) 5 %  Place 1 patch onto the skin daily. Remove & Discard patch within 12 hours or as directed by MD   Yes [provider]  metoprolol succinate (TOPROL-XL) 25 MG 24 hr tablet Take 0.5 tablets (12.5 mg total) by mouth daily. 04/11/21  Yes Barton Dubois, MD  Multiple Vitamin (MULTIVITAMIN WITH MINERALS) TABS Take 1 tablet by mouth daily.   Yes [provider]  pantoprazole (PROTONIX) 40 MG tablet Take 40 mg by mouth daily.    Yes [provider]  sitaGLIPtin (JANUVIA) 25 MG tablet Take 1 tablet (25 mg total) by mouth daily. 04/10/21  Yes Barton Dubois, MD    Physical Exam: BP (!) 134/110   Pulse (!) 102   Temp 98.8 F (37.1 C) (Oral)   Resp 19   Ht 4\' 8"  (1.422 m)   Wt 59 kg   SpO2 96%   BMI 29.16 kg/m   General: Elderly female. Awake and alert and oriented x3, but appears somewhat confused.. No acute cardiopulmonary distress.  HEENT: Normocephalic atraumatic.  Right and left ears normal in appearance.  Pupils equal, round, reactive to light. Extraocular muscles are intact. Sclerae anicteric and noninjected.  Moist mucosal membranes. No mucosal lesions.  Neck: Neck supple without lymphadenopathy. No carotid bruits. No masses palpated.  Cardiovascular: Regular rate with normal S1-S2 sounds. No murmurs, rubs, gallops auscultated. No JVD.  Respiratory: Good respiratory effort with no wheezes, rales, rhonchi. Lungs clear to auscultation bilaterally.  No accessory muscle use. Abdomen: Soft, nontender, nondistended. Active bowel sounds. No masses or hepatosplenomegaly  Skin: No rashes, lesions, or ulcerations.  Dry, warm to touch. 2+ dorsalis pedis and radial pulses. Musculoskeletal: No calf or leg pain. All major joints not erythematous nontender.  No upper or lower joint deformation.  Good ROM.  No contractures  Psychiatric: Intact judgment and insight. Pleasant and cooperative. Neurologic: No focal neurological deficits. Strength is 5/5 and symmetric in upper and lower extremities.  Cranial nerves II through XII are grossly intact.           Labs on Admission: I have personally reviewed following labs and imaging studies  CBC: Recent Labs  Lab 05/27/21 1430  WBC 5.2  NEUTROABS 4.1  HGB 11.0*  HCT 32.0*  MCV 87.7  PLT 010   Basic Metabolic Panel: Recent Labs  Lab 05/27/21 1430  NA 114*  K 4.3  CL 77*  CO2 27  GLUCOSE 174*  BUN 19  CREATININE 0.95  CALCIUM 8.9   GFR: Estimated  Creatinine Clearance: 29.9 mL/min (by C-G formula based on SCr of 0.95 mg/dL). Liver Function Tests: Recent Labs  Lab 05/27/21 1430  AST 27  ALT 15  ALKPHOS 56  BILITOT 0.9  PROT 7.5  ALBUMIN 4.0   Recent Labs  Lab 05/27/21 1430  LIPASE 23   No results for input(s): AMMONIA in the last 168 hours. Coagulation Profile: No results for input(s): INR, PROTIME in the last 168 hours. Cardiac Enzymes: No results for input(s): CKTOTAL, CKMB, CKMBINDEX, TROPONINI in the last 168 hours. BNP (last 3 results) No results for input(s): PROBNP in the last 8760 hours. HbA1C: No results for input(s): HGBA1C in the last 72 hours. CBG: No results for input(s): GLUCAP in the last 168 hours. Lipid Profile: No results for input(s): CHOL, HDL, LDLCALC, TRIG, CHOLHDL, LDLDIRECT in the last 72 hours. Thyroid Function Tests: Recent Labs    05/27/21 1430  TSH 0.812   Anemia Panel: No results for input(s): VITAMINB12, FOLATE, FERRITIN, TIBC, IRON, RETICCTPCT  in the last 72 hours. Urine analysis:    Component Value Date/Time   COLORURINE YELLOW 05/27/2021 [place] 05/27/2021 1618   LABSPEC 1.006 05/27/2021 1618   PHURINE 7.0 05/27/2021 1618   GLUCOSEU 50 (A) 05/27/2021 1618   HGBUR SMALL (A) 05/27/2021 1618   BILIRUBINUR NEGATIVE 05/27/2021 1618   KETONESUR 5 (A) 05/27/2021 1618   PROTEINUR NEGATIVE 05/27/2021 1618   NITRITE NEGATIVE 05/27/2021 1618   LEUKOCYTESUR NEGATIVE 05/27/2021 1618   Sepsis Labs: @LABRCNTIP (procalcitonin:4,lacticidven:4) ) Recent Results (from the past 240 hour(s))  Resp Panel by RT-PCR (Flu A&B, Covid) Nasopharyngeal Swab     Status: None   Collection Time: 05/27/21  4:07 PM   Specimen: Nasopharyngeal Swab; Nasopharyngeal(NP) swabs in vial transport medium  Result Value Ref Range Status   SARS Coronavirus 2 by RT PCR NEGATIVE NEGATIVE Final    Comment: (NOTE) SARS-CoV-2 target nucleic acids are NOT DETECTED.  The SARS-CoV-2 RNA is generally  detectable in upper respiratory specimens during the acute phase of infection. The lowest concentration of SARS-CoV-2 viral copies this assay can detect is 138 copies/mL. A negative result does not preclude SARS-Cov-2 infection and should not be used as the sole basis for treatment or other patient management decisions. A negative result may occur with  improper specimen collection/handling, submission of specimen other than nasopharyngeal swab, presence of viral mutation(s) within the areas targeted by this assay, and inadequate number of viral copies(<138 copies/mL). A negative result must be combined with clinical observations, patient history, and epidemiological information. The expected result is Negative.  Fact Sheet for Patients:  EntrepreneurPulse.com.au  Fact Sheet for Healthcare Providers:  IncredibleEmployment.be  This test is no t yet approved or cleared by the [place] FDA and  has been authorized for detection and/or diagnosis of SARS-CoV-2 by FDA under an Emergency Use Authorization (EUA). This EUA will remain  in effect (meaning this test can be used) for the duration of the COVID-19 declaration under Section 564(b)(1) of the Act, 21 U.S.C.section 360bbb-3(b)(1), unless the authorization is terminated  or revoked sooner.       Influenza A by PCR NEGATIVE NEGATIVE Final   Influenza B by PCR NEGATIVE NEGATIVE Final    Comment: (NOTE) The Xpert Xpress SARS-CoV-2/FLU/RSV plus assay is intended as an aid in the diagnosis of influenza from Nasopharyngeal swab specimens and should not be used as a sole basis for treatment. Nasal washings and aspirates are unacceptable for Xpert Xpress SARS-CoV-2/FLU/RSV testing.  Fact Sheet for Patients: EntrepreneurPulse.com.au  Fact Sheet for Healthcare Providers: IncredibleEmployment.be  This test is not yet approved or cleared by the [place] FDA  and has been authorized for detection and/or diagnosis of SARS-CoV-2 by FDA under an Emergency Use Authorization (EUA). This EUA will remain in effect (meaning this test can be used) for the duration of the COVID-19 declaration under Section 564(b)(1) of the Act, 21 U.S.C. section 360bbb-3(b)(1), unless the authorization is terminated or revoked.  Performed at [facility], [address], [place], [place] [address]      Radiological Exams on Admission: DG Abdomen Acute W/Chest  Result Date: 05/27/2021 CLINICAL DATA:  Nausea, vomiting. EXAM: DG ABDOMEN ACUTE WITH 1 VIEW CHEST COMPARISON:  None. FINDINGS: There is no evidence of dilated bowel loops or free intraperitoneal air. No radiopaque calculi or other significant radiographic abnormality is seen. Heart size and mediastinal contours are within normal limits. Both lungs are clear. IMPRESSION: No evidence of bowel obstruction or ileus. No acute cardiopulmonary disease.  Aortic Atherosclerosis (ICD10-I70.0). Electronically Signed   By: Marijo Conception M.D.   On: 05/27/2021 15:51    EKG: Independently reviewed.  Sinus rhythm with sinus arrhythmia.  No acute ST changes.  Assessment/Plan: Active Problems:   Metastatic cancer to spine ([place])   Type 2 diabetes with nephropathy ([place])   CKD stage 4 due to type 2 diabetes mellitus ([place])   Acute hyponatremia   Hypothyroidism    This patient was discussed with the ED physician, including pertinent vitals, physical exam findings, labs, and imaging.  We also discussed care given by the ED provider.  Acute hyponatremia Admit IV fluids -normal saline Check BMP every 4 hours TSH normal Check urine sodium, urine osmolality, serum osmolality Checks x-ray without mass Type 2 diabetes Sliding scale insulin with Levemir Hold oral hypoglycemic Hypothyroidism Continue Synthroid TSH normal Chronic chronic kidney disease Creatinine function normal Breast cancer with metastatic disease Status post  chemo and radiation.  DVT prophylaxis: lovenox Consultants: none Code Status: DNR Family Communication: Daughter  Disposition Plan: Patient should be able to return home following improvement of sodium level   Truett Mainland, DO  Addendum: Urine sodium 90. Rpt serum sodium 115. ? SIADH. Switch IV fluids to hypertonic saline at 0.4mL/kg/hour - 57mL/hour. Monitor sodium every 2 hours.  Truett Mainland, DO

## 2021-05-27 NOTE — ED Provider Notes (Addendum)
[facility] EMERGENCY DEPARTMENT Provider Note   CSN: [id number] Arrival date & time: 05/27/21  1336     History Chief Complaint  Patient presents with   Weakness    Rita Thomas is a 87 y.o. female.  HPI  Patient is an 87 year old female with a history of diabetes mellitus, hypertension, hyperlipidemia, stage IV breast cancer status post chemoradiation with metastasis to the spine, who presents to the emergency department due to weakness.  Patient states that yesterday she was walking and feeling normal.  Earlier today she began feeling weak and has now having difficulty with ambulation.  She reports multiple episodes of nausea/vomiting as well as an episode of diarrhea.  Denies any hematemesis or hematochezia.  Denies any abdominal pain, chest pain, or shortness of breath.    I spoke to patient's daughter Bethena Roys.  She states that after her recent admission she was discharged to [facility] skilled nursing facility.  She was discharged from there on August 19 and sent to [facility] family care which is where she has been residing.  Her daughter notes that she also recently got over COVID-19 earlier this month.  She states that yesterday she was walking and behaving normally.  This morning she became more weak and had difficulty with ambulation.  She also notes that she began "spitting up brown stuff".  Patient with dried brown vomit on her shirt.  Patient endorses a small amount of diarrhea this morning and her daughter states that she had a large bowel movement this morning.    Past Medical History:  Diagnosis Date   Breast cancer ([place]) 2006   Bilateral breast ca with mets to spine   Diabetes mellitus without complication (HCC)    GERD (gastroesophageal reflux disease)    H/O hiatal hernia    Hypertension    Metastasis to spinal cord [facility])    Metastatic cancer to spine ([place]) 03/13/2013   S/P chemotherapy, time since greater than 12 weeks    S/P radiation therapy     Patient Active  Problem List   Diagnosis Date Noted   Type 2 diabetes with nephropathy ([place])    CKD stage 4 due to type 2 diabetes mellitus ([place])    Physical deconditioning    SVT (supraventricular tachycardia) (HCC)    AKI (acute kidney injury) ([place]) 04/06/2021   Hypomagnesemia    Rhabdomyolysis 04/05/2021   Metastatic cancer to spine ([place]) 03/13/2013    Past Surgical History:  Procedure Laterality Date   EYE SURGERY  1995   cataracts bilaterally   MASTECTOMY  2006   POSTERIOR LUMBAR FUSION 4 LEVEL N/A 03/06/2013   Procedure: POSTERIOR LUMBAR FUSION 4 LEVEL;  Surgeon: Winfield Cunas, MD;  Location: [facility];  Service: Neurosurgery;  Laterality: N/A;  T9-L3 posterior fusion with posterolateral arthrodesis and posterior segmental instrumentation     OB History   No obstetric history on file.     Family History  Problem Relation Age of Onset   Cancer Sister    Cancer Brother    Cancer Sister        breast    Social History   Tobacco Use   Smoking status: Never   Smokeless tobacco: Never  Substance Use Topics   Alcohol use: No   Drug use: No    Home Medications Prior to Admission medications   Medication Sig Start Date End Date Taking? Authorizing Provider  acetaminophen (TYLENOL) 325 MG tablet Take 100 mg by mouth 2 (two) times daily.  [provider]  atorvastatin (LIPITOR) 10 MG tablet Take 10 mg by mouth daily. 12/20/17   [provider]  Calcium Carbonate (CALTRATE 600 PO) Take 1 tablet by mouth daily.    [provider]  exemestane (AROMASIN) 25 MG tablet Take 25 mg by mouth daily. 02/24/21   [provider]  levothyroxine (SYNTHROID) 50 MCG tablet Take 50 mcg by mouth daily. 02/24/21   [provider]  lidocaine (LIDODERM) 5 % Place 1 patch onto the skin daily. Remove & Discard patch within 12 hours or as directed by MD    [provider]  metoprolol succinate (TOPROL-XL) 25 MG 24 hr tablet Take 0.5 tablets (12.5 mg  total) by mouth daily. 04/11/21   Barton Dubois, MD  Multiple Vitamin (MULTIVITAMIN WITH MINERALS) TABS Take 1 tablet by mouth daily.    [provider]  pantoprazole (PROTONIX) 40 MG tablet Take 40 mg by mouth daily.    [provider]  sitaGLIPtin (JANUVIA) 25 MG tablet Take 1 tablet (25 mg total) by mouth daily. 04/10/21   Barton Dubois, MD    Allergies    Morphine and related  Review of Systems   Review of Systems  All other systems reviewed and are negative. Ten systems reviewed and are negative for acute change, except as noted in the HPI.   Physical Exam Updated Vital Signs BP (!) 159/98 (BP Location: Right Arm)   Pulse 87   Temp 98.8 F (37.1 C) (Oral)   Resp 16   Ht 4\' 8"  (1.422 m)   Wt 59 kg   SpO2 94%   BMI 29.16 kg/m   Physical Exam Vitals and nursing note reviewed.  Constitutional:      General: She is not in acute distress.    Appearance: She is not ill-appearing, toxic-appearing or diaphoretic.     Comments: Well-developed elderly female.  Lying in the semi-Fowler's position.  Speaks slowly but answers questions appropriately.  Dried vomit noted on her shirt.  HENT:     Head: Normocephalic and atraumatic.     Right Ear: External ear normal.     Left Ear: External ear normal.     Nose: Nose normal.     Mouth/Throat:     Mouth: Mucous membranes are moist.     Pharynx: Oropharynx is clear. No oropharyngeal exudate or posterior oropharyngeal erythema.  Eyes:     General: No scleral icterus.       Right eye: No discharge.        Left eye: No discharge.     Extraocular Movements: Extraocular movements intact.     Conjunctiva/sclera: Conjunctivae normal.  Cardiovascular:     Rate and Rhythm: Normal rate and regular rhythm.     Pulses: Normal pulses.     Heart sounds: Normal heart sounds. No murmur heard.   No friction rub. No gallop.  Pulmonary:     Effort: Pulmonary effort is normal. No respiratory distress.     Breath sounds: Normal  breath sounds. No stridor. No wheezing, rhonchi or rales.  Abdominal:     General: Abdomen is flat.     Palpations: Abdomen is soft.     Tenderness: There is no abdominal tenderness.  Musculoskeletal:        General: Normal range of motion.     Cervical back: Normal range of motion and neck supple. No tenderness.  Skin:    General: Skin is warm and dry.  Neurological:     General:  No focal deficit present.     Mental Status: She is alert.     Comments: Answering questions appropriately.  Pleasant to converse with.  Moving all 4 extremities with ease.  Following commands.  Psychiatric:        Mood and Affect: Mood normal.        Behavior: Behavior normal.   ED Results / Procedures / Treatments   Labs (all labs ordered are listed, but only abnormal results are displayed) Labs Reviewed  COMPREHENSIVE METABOLIC PANEL - Abnormal; Notable for the following components:      Result Value   Sodium 114 (*)    Chloride 77 (*)    Glucose, Bld 174 (*)    GFR, Estimated 58 (*)    All other components within normal limits  CBC WITH DIFFERENTIAL/PLATELET - Abnormal; Notable for the following components:   RBC 3.65 (*)    Hemoglobin 11.0 (*)    HCT 32.0 (*)    Lymphs Abs 0.5 (*)    All other components within normal limits  RESP PANEL BY RT-PCR (FLU A&B, COVID) ARPGX2  LIPASE, BLOOD  URINALYSIS, ROUTINE W REFLEX MICROSCOPIC  SODIUM, URINE, RANDOM  CREATININE, URINE, RANDOM  OSMOLALITY, URINE  OSMOLALITY   EKG EKG Interpretation  Date/Time:  Saturday May 27 2021 15:04:10 EDT Ventricular Rate:  92 PR Interval:  176 QRS Duration: 74 QT Interval:  344 QTC Calculation: 425 R Axis:   -13 Text Interpretation: Sinus rhythm with marked sinus arrhythmia Cannot rule out Inferior infarct , age undetermined Abnormal ECG No significant change since last tracing Confirmed by Calvert Cantor [phone]) on 05/27/2021 3:22:55 PM  Radiology DG Abdomen Acute W/Chest  Result Date:  05/27/2021 CLINICAL DATA:  Nausea, vomiting. EXAM: DG ABDOMEN ACUTE WITH 1 VIEW CHEST COMPARISON:  None. FINDINGS: There is no evidence of dilated bowel loops or free intraperitoneal air. No radiopaque calculi or other significant radiographic abnormality is seen. Heart size and mediastinal contours are within normal limits. Both lungs are clear. IMPRESSION: No evidence of bowel obstruction or ileus. No acute cardiopulmonary disease. Aortic Atherosclerosis (ICD10-I70.0). Electronically Signed   By: Marijo Conception M.D.   On: 05/27/2021 15:51    Procedures Procedures   Medications Ordered in ED Medications  ondansetron (ZOFRAN) injection 4 mg (0 mg Intravenous Hold 05/27/21 1501)  sodium chloride 0.9 % bolus 1,000 mL (1,000 mLs Intravenous New Bag/Given 05/27/21 1459)   ED Course  I have reviewed the triage vital signs and the nursing notes.  Pertinent labs & imaging results that were available during my care of the patient were reviewed by me and considered in my medical decision making (see chart for details).  Clinical Course as of 05/27/21 2041  Sat May 27, 2021  1451 Sodium(!!): 114 [LJ]    Clinical Course User Index [LJ] Rayna Sexton, PA-C   MDM Rules/Calculators/A&P                          Pt is a 87 y.o. female who presents to the emergency department due to weakness as well as nausea and vomiting that started earlier this morning.  Patient recently COVID-positive earlier this month.  Labs: CBC with RBCs of 3.65, hemoglobin of 11, hematocrit of 32. CMP with a sodium of 114, chloride of 77, glucose of 174, GFR 58. Serum osmolality is pending. UA, urine osmolality, urine creatinine, urine sodium in process. Respiratory panel is in process.  Imaging: X-rays of the abdomen  and chest showed no evidence of bowel obstruction or ileus.  No acute cardiopulmonary disease.  I, Rayna Sexton, PA-C, personally reviewed and evaluated these images and lab results as part of my  medical decision-making.  Patient found to be profoundly hyponatremic at this visit.  Urine osmolality, creatinine, and urine sodium are in process.  Patient not on any diuretics.  She did start experiencing nausea and vomiting but states that this began earlier today.  Possibly secondary to hypovolemia.  I spoke to her daughter Bethena Roys once again and she states that she had a similar admission in 2014 at [facility].  She states that she was ultimately discharged and they had to provide her sodium supplements for some time.  Patient will require admission to the medicine team for further management.  We will discussed with the medicine team at this time.  Note: Portions of this report may have been transcribed using voice recognition software. Every effort was made to ensure accuracy; however, inadvertent computerized transcription errors may be present.   Final Clinical Impression(s) / ED Diagnoses Final diagnoses:  Hyponatremia  Weakness  Nausea and vomiting, intractability of vomiting not specified, unspecified vomiting type   Rx / DC Orders ED Discharge Orders     None        Rayna Sexton, PA-C 05/27/21 1654    Rayna Sexton, PA-C 05/27/21 2041    Margette Fast, MD 05/28/21 [phone]

## 2021-05-27 NOTE — ED Notes (Signed)
Pt is restless. Unable to redirect regarding commands. Pt pulling at leads and taking her gown off.

## 2021-05-27 NOTE — ED Notes (Signed)
Pt soiled with feces and urine upon assessment. Linens changed, pericare provided, and new gown placed.

## 2021-05-28 DIAGNOSIS — G9341 Metabolic encephalopathy: Secondary | ICD-10-CM | POA: Diagnosis present

## 2021-05-28 DIAGNOSIS — L8990 Pressure ulcer of unspecified site, unspecified stage: Secondary | ICD-10-CM | POA: Diagnosis present

## 2021-05-28 LAB — BASIC METABOLIC PANEL
Anion gap: 10 (ref 5–15)
Anion gap: 6 (ref 5–15)
Anion gap: 7 (ref 5–15)
Anion gap: 8 (ref 5–15)
Anion gap: 9 (ref 5–15)
Anion gap: 9 (ref 5–15)
BUN: 16 mg/dL (ref 8–23)
BUN: 16 mg/dL (ref 8–23)
BUN: 17 mg/dL (ref 8–23)
BUN: 17 mg/dL (ref 8–23)
BUN: 18 mg/dL (ref 8–23)
BUN: 18 mg/dL (ref 8–23)
CO2: 21 mmol/L — ABNORMAL LOW (ref 22–32)
CO2: 23 mmol/L (ref 22–32)
CO2: 23 mmol/L (ref 22–32)
CO2: 24 mmol/L (ref 22–32)
CO2: 24 mmol/L (ref 22–32)
CO2: 24 mmol/L (ref 22–32)
Calcium: 7.5 mg/dL — ABNORMAL LOW (ref 8.9–10.3)
Calcium: 7.6 mg/dL — ABNORMAL LOW (ref 8.9–10.3)
Calcium: 8 mg/dL — ABNORMAL LOW (ref 8.9–10.3)
Calcium: 8.1 mg/dL — ABNORMAL LOW (ref 8.9–10.3)
Calcium: 8.3 mg/dL — ABNORMAL LOW (ref 8.9–10.3)
Calcium: 8.5 mg/dL — ABNORMAL LOW (ref 8.9–10.3)
Chloride: 83 mmol/L — ABNORMAL LOW (ref 98–111)
Chloride: 86 mmol/L — ABNORMAL LOW (ref 98–111)
Chloride: 88 mmol/L — ABNORMAL LOW (ref 98–111)
Chloride: 90 mmol/L — ABNORMAL LOW (ref 98–111)
Chloride: 90 mmol/L — ABNORMAL LOW (ref 98–111)
Chloride: 91 mmol/L — ABNORMAL LOW (ref 98–111)
Creatinine, Ser: 0.79 mg/dL (ref 0.44–1.00)
Creatinine, Ser: 0.82 mg/dL (ref 0.44–1.00)
Creatinine, Ser: 0.83 mg/dL (ref 0.44–1.00)
Creatinine, Ser: 0.87 mg/dL (ref 0.44–1.00)
Creatinine, Ser: 0.93 mg/dL (ref 0.44–1.00)
Creatinine, Ser: 0.95 mg/dL (ref 0.44–1.00)
GFR, Estimated: 58 mL/min — ABNORMAL LOW (ref >60)
GFR, Estimated: 59 mL/min — ABNORMAL LOW (ref >60)
GFR, Estimated: >60 mL/min (ref >60)
GFR, Estimated: >60 mL/min (ref >60)
GFR, Estimated: >60 mL/min (ref >60)
GFR, Estimated: >60 mL/min (ref >60)
Glucose, Bld: 120 mg/dL — ABNORMAL HIGH (ref 70–99)
Glucose, Bld: 145 mg/dL — ABNORMAL HIGH (ref 70–99)
Glucose, Bld: 150 mg/dL — ABNORMAL HIGH (ref 70–99)
Glucose, Bld: 155 mg/dL — ABNORMAL HIGH (ref 70–99)
Glucose, Bld: 164 mg/dL — ABNORMAL HIGH (ref 70–99)
Glucose, Bld: 179 mg/dL — ABNORMAL HIGH (ref 70–99)
Potassium: 3.9 mmol/L (ref 3.5–5.1)
Potassium: 3.9 mmol/L (ref 3.5–5.1)
Potassium: 3.9 mmol/L (ref 3.5–5.1)
Potassium: 4.1 mmol/L (ref 3.5–5.1)
Potassium: 4.2 mmol/L (ref 3.5–5.1)
Potassium: 4.2 mmol/L (ref 3.5–5.1)
Sodium: 116 mmol/L — CL (ref 135–145)
Sodium: 119 mmol/L — CL (ref 135–145)
Sodium: 119 mmol/L — CL (ref 135–145)
Sodium: 119 mmol/L — CL (ref 135–145)
Sodium: 121 mmol/L — ABNORMAL LOW (ref 135–145)
Sodium: 122 mmol/L — ABNORMAL LOW (ref 135–145)

## 2021-05-28 LAB — OSMOLALITY
Osmolality: 248 mOsm/kg — CL (ref 275–295)
Osmolality: 246 mOsm/kg — CL (ref 275–295)

## 2021-05-28 LAB — GLUCOSE, CAPILLARY
Glucose-Capillary: 167 mg/dL — ABNORMAL HIGH (ref 70–99)
Glucose-Capillary: 138 mg/dL — ABNORMAL HIGH (ref 70–99)
Glucose-Capillary: 155 mg/dL — ABNORMAL HIGH (ref 70–99)
Glucose-Capillary: 158 mg/dL — ABNORMAL HIGH (ref 70–99)
Glucose-Capillary: 102 mg/dL — ABNORMAL HIGH (ref 70–99)

## 2021-05-28 LAB — OSMOLALITY, URINE: Osmolality, Ur: 297 mOsm/kg — ABNORMAL LOW (ref 300–900)

## 2021-05-28 MED ORDER — METOPROLOL TARTRATE 25 MG PO TABS: 12.5000 mg | ORAL_TABLET | Freq: Two times a day (BID) | ORAL | Status: DC

## 2021-05-28 MED ORDER — SODIUM CHLORIDE 0.9 % IV SOLN: INTRAVENOUS | Status: DC

## 2021-05-28 MED ORDER — ACETAMINOPHEN 325 MG PO TABS
650.0000 mg | ORAL_TABLET | Freq: Four times a day (QID) | ORAL | Status: DC | PRN
  Administered 2021-05-28 – 2021-05-29 (×4): 650 mg via ORAL
  Filled 2021-05-28 (×4): qty 2

## 2021-05-28 NOTE — Progress Notes (Signed)
Notified Dr. Denton Brick of critical lab value of sodium at 119. He stated that was ok at this time.

## 2021-05-28 NOTE — Progress Notes (Signed)
Patient Demographics:    Rita Thomas, is a 87 y.o. female, DOB - [birth year], KGU:[id number]  Admit date - 05/27/2021   Admitting Physician Truett Mainland, DO  Outpatient Primary MD for the patient is Neale Burly, MD  LOS - 1   Chief Complaint  Patient presents with   Weakness        Subjective:    Rita Thomas today has no fevers,   No chest pain,   -Patient's daughter at bedside -Patient with confusion, restlessness and agitation from time to time -No further vomiting or diarrhea  Assessment  & Plan :    Principal Problem:   Acute hyponatremia Active Problems:   Acute metabolic encephalopathy-due to hyponatremia   Metastatic cancer to spine ([place])   Type 2 diabetes with nephropathy ([place])   CKD stage 4 due to type 2 diabetes mellitus ([place])   Hypothyroidism   Pressure injury of skin  Brief Summary:- 87 y.o. female with a history of breast cancer with metastatic cancer to the spine status post chemotherapy and radiation, diabetes, hypothyroidism, hypertension, GERD admitted on 05/27/2021 with acute symptomatic hyponatremia with a sodium of 114 resulting in altered mentation-low sodium is most likely due to nausea vomiting and diarrhea at ALF prior to admission  A/p 1) acute symptomatic hyponatremia--- sodium was down to 114 -No concerns for seizures -With 3% saline solution --- sodium is now in the 120 range -Okay to stop 3% saline and transition to normal saline at 100 mL an hour -Continue to monitor serial sodiums and adjust fluid consistency and rate accordingly -  2) acute metabolic encephalopathy--- secondary to #1 above -Patient reports some mild baseline dementia with mild cognitive and memory deficits  3) chronic atrial fibrillation--no anticoagulation PTA, okay to restart metoprolol for rate control  4) nausea vomiting or diarrhea----no further emesis no further diarrhea  continue to monitor, tolerating oral intake fairly  5)Breast cancer with metastatic disease-treated with chemoradiation  6)DM2-A1c 6.7 reflecting excellent diabetic control PTA Continue Lantus insulin at 10 units nightly Use Novolog/Humalog Sliding scale insulin with Accu-Cheks/Fingersticks as ordered   7)Hypothyroidism--- continue levothyroxine, TSH WNL  NB!!--patient does Not appear to have CKD  Disposition/Need for in-Hospital Stay- patient unable to be discharged at this time due to --severe symptomatic hyponatremia with significant acute metabolic encephalopathy requiring IV fluids*  Status is: Inpatient  Remains inpatient appropriate because: Please see disposition above  Disposition: The patient is from: ALF              Anticipated d/c is to: ALF              Anticipated d/c date is: 2 days              Patient currently is not medically stable to d/c. Barriers: Not Clinically Stable-   Code Status :   Code Status: DNR   Family Communication:   Discussed with daughter at bedside  Consults  :   DVT Prophylaxis  :   - SCDs   enoxaparin (LOVENOX) injection 40 mg Start: 05/27/21 2215    Lab Results  Component Value Date   PLT 266 05/27/2021    Inpatient Medications  Scheduled Meds:  atorvastatin  10 mg Oral Daily  Chlorhexidine Gluconate Cloth  6 each Topical Q0600   enoxaparin (LOVENOX) injection  40 mg Subcutaneous Q24H   exemestane  25 mg Oral Daily   insulin aspart  0-5 Units Subcutaneous QHS   insulin aspart  0-9 Units Subcutaneous TID WC   insulin detemir  10 Units Subcutaneous QHS   levothyroxine  50 mcg Oral Q0600   metoprolol succinate  12.5 mg Oral Daily   ondansetron (ZOFRAN) IV  4 mg Intravenous Once   pantoprazole  40 mg Oral Daily   Continuous Infusions:  sodium chloride 75 mL/hr at 05/28/21 1219   PRN Meds:.acetaminophen, ondansetron **OR** ondansetron (ZOFRAN) IV    Anti-infectives (From admission, onward)    None          Objective:   Vitals:   05/28/21 1600 05/28/21 1612 05/28/21 1700 05/28/21 1800  BP: (!) 161/89  (!) 151/63 (!) 145/72  Pulse: 92     Resp: (!) 23 16 20  (!) 24  Temp:  99.5 F (37.5 C)    TempSrc:  Oral    SpO2:      Weight:      Height:        Wt Readings from Last 3 Encounters:  05/27/21 59 kg  04/05/21 59 kg  03/16/13 58.1 kg     Intake/Output Summary (Last 24 hours) at 05/28/2021 1924 Last data filed at 05/28/2021 1841 Gross per 24 hour  Intake 1376.31 ml  Output 300 ml  Net 1076.31 ml     Physical Exam  Gen:-Confused and disoriented but in no acute distress  HEENT:- .AT, No sclera icterus Neck-Supple Neck,No JVD,.  Lungs-  CTAB , fair symmetrical air movement CV- S1, S2 normal, regular  Abd-  +ve B.Sounds, Abd Soft, No tenderness,    Extremity/Skin:- No  edema, pedal pulses present  NeuroPsych-restless, pulling at lines, confused, disoriented, no new focal deficits  Data Review:   Micro Results Recent Results (from the past 240 hour(s))  Resp Panel by RT-PCR (Flu A&B, Covid) Nasopharyngeal Swab     Status: None   Collection Time: 05/27/21  4:07 PM   Specimen: Nasopharyngeal Swab; Nasopharyngeal(NP) swabs in vial transport medium  Result Value Ref Range Status   SARS Coronavirus 2 by RT PCR NEGATIVE NEGATIVE Final    Comment: (NOTE) SARS-CoV-2 target nucleic acids are NOT DETECTED.  The SARS-CoV-2 RNA is generally detectable in upper respiratory specimens during the acute phase of infection. The lowest concentration of SARS-CoV-2 viral copies this assay can detect is 138 copies/mL. A negative result does not preclude SARS-Cov-2 infection and should not be used as the sole basis for treatment or other patient management decisions. A negative result may occur with  improper specimen collection/handling, submission of specimen other than nasopharyngeal swab, presence of viral mutation(s) within the areas targeted by this assay, and inadequate number of  viral copies(<138 copies/mL). A negative result must be combined with clinical observations, patient history, and epidemiological information. The expected result is Negative.  Fact Sheet for Patients:  EntrepreneurPulse.com.au  Fact Sheet for Healthcare Providers:  IncredibleEmployment.be  This test is no t yet approved or cleared by the [place] FDA and  has been authorized for detection and/or diagnosis of SARS-CoV-2 by FDA under an Emergency Use Authorization (EUA). This EUA will remain  in effect (meaning this test can be used) for the duration of the COVID-19 declaration under Section 564(b)(1) of the Act, 21 U.S.C.section 360bbb-3(b)(1), unless the authorization is terminated  or revoked sooner.  Influenza A by PCR NEGATIVE NEGATIVE Final   Influenza B by PCR NEGATIVE NEGATIVE Final    Comment: (NOTE) The Xpert Xpress SARS-CoV-2/FLU/RSV plus assay is intended as an aid in the diagnosis of influenza from Nasopharyngeal swab specimens and should not be used as a sole basis for treatment. Nasal washings and aspirates are unacceptable for Xpert Xpress SARS-CoV-2/FLU/RSV testing.  Fact Sheet for Patients: EntrepreneurPulse.com.au  Fact Sheet for Healthcare Providers: IncredibleEmployment.be  This test is not yet approved or cleared by the [place] FDA and has been authorized for detection and/or diagnosis of SARS-CoV-2 by FDA under an Emergency Use Authorization (EUA). This EUA will remain in effect (meaning this test can be used) for the duration of the COVID-19 declaration under Section 564(b)(1) of the Act, 21 U.S.C. section 360bbb-3(b)(1), unless the authorization is terminated or revoked.  Performed at [facility], [address], [place], [place] [address]     Radiology Reports DG Abdomen Acute W/Chest  Result Date: 05/27/2021 CLINICAL DATA:  Nausea, vomiting. EXAM: DG  ABDOMEN ACUTE WITH 1 VIEW CHEST COMPARISON:  None. FINDINGS: There is no evidence of dilated bowel loops or free intraperitoneal air. No radiopaque calculi or other significant radiographic abnormality is seen. Heart size and mediastinal contours are within normal limits. Both lungs are clear. IMPRESSION: No evidence of bowel obstruction or ileus. No acute cardiopulmonary disease. Aortic Atherosclerosis (ICD10-I70.0). Electronically Signed   By: Marijo Conception M.D.   On: 05/27/2021 15:51     CBC Recent Labs  Lab 05/27/21 1430  WBC 5.2  HGB 11.0*  HCT 32.0*  PLT 266  MCV 87.7  MCH 30.1  MCHC 34.4  RDW 11.9  LYMPHSABS 0.5*  MONOABS 0.4  EOSABS 0.1  BASOSABS 0.0    Chemistries  Recent Labs  Lab 05/27/21 1430 05/27/21 1948 05/27/21 2350 05/28/21 0409 05/28/21 0751 05/28/21 1105 05/28/21 1613  NA 114*   < > 116* 119* 121* 122* 119*  K 4.3   < > 3.9 4.2 4.2 4.1 3.9  CL 77*   < > 83* 86* 88* 90* 90*  CO2 27   < > 23 24 24 24 23   GLUCOSE 174*   < > 145* 164* 120* 150* 155*  BUN 19   < > 17 16 17 18 18   CREATININE 0.95   < > 0.82 0.79 0.93 0.95 0.83  CALCIUM 8.9   < > 8.3* 8.5* 8.1* 8.0* 7.5*  AST 27  --   --   --   --   --   --   ALT 15  --   --   --   --   --   --   ALKPHOS 56  --   --   --   --   --   --   BILITOT 0.9  --   --   --   --   --   --    < > = values in this interval not displayed.   ------------------------------------------------------------------------------------------------------------------ No results for input(s): CHOL, HDL, LDLCALC, TRIG, CHOLHDL, LDLDIRECT in the last 72 hours.  Lab Results  Component Value Date   HGBA1C 6.7 (H) 04/05/2021   ------------------------------------------------------------------------------------------------------------------ Recent Labs    05/27/21 1430  TSH 0.812   ------------------------------------------------------------------------------------------------------------------ No results for input(s):  VITAMINB12, FOLATE, FERRITIN, TIBC, IRON, RETICCTPCT in the last 72 hours.  Coagulation profile No results for input(s): INR, PROTIME in the last 168 hours.  No results for input(s): DDIMER in the last  72 hours.  Cardiac Enzymes No results for input(s): CKMB, TROPONINI, MYOGLOBIN in the last 168 hours.  Invalid input(s): CK ------------------------------------------------------------------------------------------------------------------ No results found for: BNP   Roxan Hockey M.D on 05/28/2021 at 7:24 PM  Go to [email or web address] - for contact info  [facility] - Office  [phone]

## 2021-05-28 NOTE — Progress Notes (Signed)
RN called due to patient going into A. fib with RVR.  Apparently, patient was in A. fib with rate control on arrival to the ICU, she eventually went into A. fib with RVR which was confirmed by EKG done at 3:39 AM.  Patient was also noted to be agitated and complaining of back pain.  I went to ICU, Tylenol was ordered for the back pain, she was not in any acute distress, though she continues to be agitated (possibly due to back pain).  She takes Toprol XL 12.5 mg daily (unknown if she has a prior history of A. fib).  Patient's back pain could have contributed to her agitation.  We shall continue to monitor patient and treat accordingly if patient continues to be in A. fib with RVR despite better pain and agitation control.

## 2021-05-28 NOTE — Progress Notes (Signed)
Patient has been in Afib since arrival from the ER around 0100. Afib has been rate controlled in the high 80's to 90's until around 0245 when patient's heart rate increased to 115-145. Patient is agitated. EKG obtained and confirmed AFIB with RVR rhythm. Notified Dr. Josephine Cables. Dr.Adefso arrived at patient's bedside around 0345. Tylenol ordered for back pain. No other orders received at this time. Patient's heart rate is presently in the 120's.

## 2021-05-28 NOTE — Plan of Care (Signed)
Care Plan Reviewed.

## 2021-05-28 NOTE — ED Notes (Signed)
Date and time results received: 05/28/21 0026  Test: Sodium Critical Value: 116  Name of Provider Notified: Josephine Cables, MD  Orders Received? Or Actions Taken?: acknowledged

## 2021-05-29 ENCOUNTER — Ambulatory Visit: Payer: Medicare Other | Admitting: Cardiology

## 2021-05-29 DIAGNOSIS — I482 Chronic atrial fibrillation, unspecified: Secondary | ICD-10-CM | POA: Diagnosis present

## 2021-05-29 LAB — BASIC METABOLIC PANEL
Anion gap: 5 (ref 5–15)
BUN: 18 mg/dL (ref 8–23)
CO2: 22 mmol/L (ref 22–32)
Calcium: 6.9 mg/dL — ABNORMAL LOW (ref 8.9–10.3)
Chloride: 95 mmol/L — ABNORMAL LOW (ref 98–111)
Creatinine, Ser: 0.85 mg/dL (ref 0.44–1.00)
GFR, Estimated: >60 mL/min (ref >60)
Glucose, Bld: 129 mg/dL — ABNORMAL HIGH (ref 70–99)
Potassium: 3.6 mmol/L (ref 3.5–5.1)
Sodium: 122 mmol/L — ABNORMAL LOW (ref 135–145)

## 2021-05-29 LAB — RENAL FUNCTION PANEL
Albumin: 3.7 g/dL (ref 3.5–5.0)
Anion gap: 10 (ref 5–15)
BUN: 15 mg/dL (ref 8–23)
CO2: 22 mmol/L (ref 22–32)
Calcium: 8 mg/dL — ABNORMAL LOW (ref 8.9–10.3)
Chloride: 90 mmol/L — ABNORMAL LOW (ref 98–111)
Creatinine, Ser: 0.78 mg/dL (ref 0.44–1.00)
GFR, Estimated: >60 mL/min (ref >60)
Glucose, Bld: 84 mg/dL (ref 70–99)
Phosphorus: 2.5 mg/dL (ref 2.5–4.6)
Potassium: 4.1 mmol/L (ref 3.5–5.1)
Sodium: 122 mmol/L — ABNORMAL LOW (ref 135–145)

## 2021-05-29 LAB — CBC
HCT: 28 % — ABNORMAL LOW (ref 36.0–46.0)
Hemoglobin: 9.8 g/dL — ABNORMAL LOW (ref 12.0–15.0)
MCH: 30.6 pg (ref 26.0–34.0)
MCHC: 35 g/dL (ref 30.0–36.0)
MCV: 87.5 fL (ref 80.0–100.0)
Platelets: 192 10*3/uL (ref 150–400)
RBC: 3.2 MIL/uL — ABNORMAL LOW (ref 3.87–5.11)
RDW: 11.9 % (ref 11.5–15.5)
WBC: 6 10*3/uL (ref 4.0–10.5)
nRBC: 0.3 % — ABNORMAL HIGH (ref 0.0–0.2)

## 2021-05-29 LAB — GLUCOSE, CAPILLARY
Glucose-Capillary: 92 mg/dL (ref 70–99)
Glucose-Capillary: 201 mg/dL — ABNORMAL HIGH (ref 70–99)
Glucose-Capillary: 98 mg/dL (ref 70–99)
Glucose-Capillary: 162 mg/dL — ABNORMAL HIGH (ref 70–99)
Glucose-Capillary: 108 mg/dL — ABNORMAL HIGH (ref 70–99)

## 2021-05-29 LAB — MRSA NEXT GEN BY PCR, NASAL: MRSA by PCR Next Gen: NOT DETECTED

## 2021-05-29 MED ORDER — GUAIFENESIN-DM 100-10 MG/5ML PO SYRP: 5.0000 mL | ORAL_SOLUTION | ORAL | Status: DC | PRN

## 2021-05-29 MED ORDER — SODIUM CHLORIDE 1 G PO TABS
1.0000 g | ORAL_TABLET | Freq: Two times a day (BID) | ORAL | Status: DC
  Administered 2021-05-29 – 2021-05-31 (×4): 1 g via ORAL
  Filled 2021-05-29 (×4): qty 1

## 2021-05-29 MED ORDER — SODIUM CHLORIDE 0.9 % IV BOLUS
500.0000 mL | Freq: Once | INTRAVENOUS | Status: AC
  Administered 2021-05-29: 500 mL via INTRAVENOUS

## 2021-05-29 NOTE — Progress Notes (Signed)
Patient Demographics:    Rita Thomas, is a 87 y.o. female, DOB - 1932/04/16, GEZ:[id number]  Admit date - 05/27/2021   Admitting Physician Truett Mainland, DO  Outpatient Primary MD for the patient is Rita Burly, MD  LOS - 2   Chief Complaint  Patient presents with   Weakness        Subjective:    [name] today has no fevers,   No chest pain,   Less confused , apparently did not sleep much overnight, a bit more sleepy today -I Called and obtained additional information from patient's daughter who visited with patient also today  Assessment  & Plan :    Principal Problem:   Acute hyponatremia Active Problems:   Acute metabolic encephalopathy-due to hyponatremia   Metastatic cancer to spine ([place])   Type 2 diabetes with nephropathy ([employer])   CKD stage 4 due to type 2 diabetes mellitus ([place])   Hypothyroidism   Pressure injury of skin  Brief Summary:- 87 y.o. female with a history of breast cancer with metastatic cancer to the spine status post chemotherapy and radiation, diabetes, hypothyroidism, hypertension, GERD admitted on 05/27/2021 with acute symptomatic hyponatremia with a sodium of 114 resulting in altered mentation-low sodium is most likely due to nausea vomiting and diarrhea at ALF prior to admission  A/p 1)Acute Symptomatic Hyponatremia---  Na 114 >> 122 -No concerns for seizures -With 3% saline solution --- sodium is now in the 120 range -we stopped 3% saline and transitioned to normal saline at 100 mL an hour -Continue to monitor serial sodiums and adjust fluid consistency and rate accordingly -OK to start Nacl Tab as ordered (pt pt's daughter patient previously used sodium chloride tablets) - 2)Acute Metabolic Encephalopathy--- secondary to #1 above -Patient's daughter reports some mild baseline dementia with mild cognitive and memory deficits  3)Chronic Atrial  Fibrillation--no anticoagulation PTA, Stop Metoprolol due to soft BP  4)Nausea vomiting and diarrhea----no further emesis no further diarrhea continue to monitor, tolerating oral intake fairly  5)Breast cancer with metastatic disease-treated with chemoradiation  6)DM2-A1c 6.7 reflecting excellent diabetic control PTA Continue Lantus insulin at 10 units nightly Use Novolog/Humalog Sliding scale insulin with Accu-Cheks/Fingersticks as ordered   7)Hypothyroidism--- continue levothyroxine, TSH WNL  8) chronic normocytic and normochromic anemia---- -recent drop in H&H is most likely due to hemodilution from IV fluids -No bleeding concerns at this time  9) generalized weakness and deconditioning----admitted from ALF/family care home--- she will have to be independent with ambulation to return the ER otherwise she will need SNF rehab -Get PT eval  NB!!--patient does Not appear to have CKD  Disposition/Need for in-Hospital Stay- patient unable to be discharged at this time due to --severe symptomatic hyponatremia with significant acute metabolic encephalopathy requiring IV fluids*  Status is: Inpatient  Remains inpatient appropriate because: Please see disposition above  Disposition: The patient is from: ALF/family care home              Anticipated d/c is to: ALF with HH Vs SNF              Anticipated d/c date is: 2 days              Patient currently is not medically  stable to d/c. Barriers: Not Clinically Stable-   Code Status :   Code Status: DNR   Family Communication:   Discussed with daughter at bedside -I called and updated patient's daughter on 05/29/2021 Consults  :   DVT Prophylaxis  :   - SCDs   enoxaparin (LOVENOX) injection 40 mg Start: 05/27/21 2215    Lab Results  Component Value Date   PLT 192 05/29/2021    Inpatient Medications  Scheduled Meds:  atorvastatin  10 mg Oral Daily   Chlorhexidine Gluconate Cloth  6 each Topical Q0600   enoxaparin (LOVENOX)  injection  40 mg Subcutaneous Q24H   exemestane  25 mg Oral Daily   insulin aspart  0-5 Units Subcutaneous QHS   insulin aspart  0-9 Units Subcutaneous TID WC   insulin detemir  10 Units Subcutaneous QHS   levothyroxine  50 mcg Oral Q0600   metoprolol succinate  12.5 mg Oral Daily   ondansetron (ZOFRAN) IV  4 mg Intravenous Once   pantoprazole  40 mg Oral Daily   Continuous Infusions:  sodium chloride 100 mL/hr at 05/29/21 0015   sodium chloride     PRN Meds:.acetaminophen, ondansetron **OR** ondansetron (ZOFRAN) IV    Anti-infectives (From admission, onward)    None         Objective:   Vitals:   05/29/21 1100 05/29/21 1122 05/29/21 1206 05/29/21 1228  BP:   (!) 104/55   Pulse:  77 (!) 47 66  Resp:  20 (!) 23 (!) 21  Temp: 97.8 F (36.6 C)     TempSrc: Oral     SpO2:  100% 94% 97%  Weight:      Height:        Wt Readings from Last 3 Encounters:  05/29/21 61.9 kg  04/05/21 59 kg  03/16/13 58.1 kg     Intake/Output Summary (Last 24 hours) at 05/29/2021 1410 Last data filed at 05/29/2021 0900 Gross per 24 hour  Intake 1939.8 ml  Output 1200 ml  Net 739.8 ml     Physical Exam  Gen:-Confused and disoriented but in no acute distress  HEENT:- [place].AT, No sclera icterus Neck-Supple Neck,No JVD,.  Lungs-  CTAB , fair symmetrical air movement CV- S1, S2 normal, regular , Rt sided porthAcath Abd-  +ve B.Sounds, Abd Soft, No tenderness,    Extremity/Skin:- No  edema, pedal pulses present  NeuroPsych-less restless, less disoriented, Generalized weakness, no new focal deficits  Data Review:   Micro Results Recent Results (from the past 240 hour(s))  Resp Panel by RT-PCR (Flu A&B, Covid) Nasopharyngeal Swab     Status: None   Collection Time: 05/27/21  4:07 PM   Specimen: Nasopharyngeal Swab; Nasopharyngeal(NP) swabs in vial transport medium  Result Value Ref Range Status   SARS Coronavirus 2 by RT PCR NEGATIVE NEGATIVE Final    Comment: (NOTE) SARS-CoV-2  target nucleic acids are NOT DETECTED.  The SARS-CoV-2 RNA is generally detectable in upper respiratory specimens during the acute phase of infection. The lowest concentration of SARS-CoV-2 viral copies this assay can detect is 138 copies/mL. A negative result does not preclude SARS-Cov-2 infection and should not be used as the sole basis for treatment or other patient management decisions. A negative result may occur with  improper specimen collection/handling, submission of specimen other than nasopharyngeal swab, presence of viral mutation(s) within the areas targeted by this assay, and inadequate number of viral copies(<138 copies/mL). A negative result must be combined with clinical observations, patient  history, and epidemiological information. The expected result is Negative.  Fact Sheet for Patients:  EntrepreneurPulse.com.au  Fact Sheet for Healthcare Providers:  IncredibleEmployment.be  This test is no t yet approved or cleared by the [place] FDA and  has been authorized for detection and/or diagnosis of SARS-CoV-2 by FDA under an Emergency Use Authorization (EUA). This EUA will remain  in effect (meaning this test can be used) for the duration of the COVID-19 declaration under Section 564(b)(1) of the Act, 21 U.S.C.section 360bbb-3(b)(1), unless the authorization is terminated  or revoked sooner.       Influenza A by PCR NEGATIVE NEGATIVE Final   Influenza B by PCR NEGATIVE NEGATIVE Final    Comment: (NOTE) The Xpert Xpress SARS-CoV-2/FLU/RSV plus assay is intended as an aid in the diagnosis of influenza from Nasopharyngeal swab specimens and should not be used as a sole basis for treatment. Nasal washings and aspirates are unacceptable for Xpert Xpress SARS-CoV-2/FLU/RSV testing.  Fact Sheet for Patients: EntrepreneurPulse.com.au  Fact Sheet for Healthcare  Providers: IncredibleEmployment.be  This test is not yet approved or cleared by the [place] FDA and has been authorized for detection and/or diagnosis of SARS-CoV-2 by FDA under an Emergency Use Authorization (EUA). This EUA will remain in effect (meaning this test can be used) for the duration of the COVID-19 declaration under Section 564(b)(1) of the Act, 21 U.S.C. section 360bbb-3(b)(1), unless the authorization is terminated or revoked.  Performed at [facility], [address], [place], [place] [address]   MRSA Next Gen by PCR, Nasal     Status: None   Collection Time: 05/28/21  8:51 PM   Specimen: Nasal Mucosa; Nasal Swab  Result Value Ref Range Status   MRSA by PCR Next Gen NOT DETECTED NOT DETECTED Final    Comment: (NOTE) The GeneXpert MRSA Assay (FDA approved for NASAL specimens only), is one component of a comprehensive MRSA colonization surveillance program. It is not intended to diagnose MRSA infection nor to guide or monitor treatment for MRSA infections. Test performance is not FDA approved in patients less than 70 years old. Performed at [facility], [address], [place], [place] [address]     Radiology Reports DG Abdomen Acute W/Chest  Result Date: 05/27/2021 CLINICAL DATA:  Nausea, vomiting. EXAM: DG ABDOMEN ACUTE WITH 1 VIEW CHEST COMPARISON:  None. FINDINGS: There is no evidence of dilated bowel loops or free intraperitoneal air. No radiopaque calculi or other significant radiographic abnormality is seen. Heart size and mediastinal contours are within normal limits. Both lungs are clear. IMPRESSION: No evidence of bowel obstruction or ileus. No acute cardiopulmonary disease. Aortic Atherosclerosis (ICD10-I70.0). Electronically Signed   By: Marijo Conception M.D.   On: 05/27/2021 15:51     CBC Recent Labs  Lab 05/27/21 1430 05/29/21 0451  WBC 5.2 6.0  HGB 11.0* 9.8*  HCT 32.0* 28.0*  PLT 266 192  MCV 87.7 87.5  MCH 30.1 30.6   MCHC 34.4 35.0  RDW 11.9 11.9  LYMPHSABS 0.5*  --   MONOABS 0.4  --   EOSABS 0.1  --   BASOSABS 0.0  --     Chemistries  Recent Labs  Lab 05/27/21 1430 05/27/21 1948 05/28/21 0751 05/28/21 1105 05/28/21 1613 05/28/21 2254 05/29/21 0451  NA 114*   < > 121* 122* 119* 119* 122*  K 4.3   < > 4.2 4.1 3.9 3.9 4.1  CL 77*   < > 88* 90* 90* 91* 90*  CO2 27   < >  24 24 23  21* 22  GLUCOSE 174*   < > 120* 150* 155* 179* 84  BUN 19   < > 17 18 18 16 15   CREATININE 0.95   < > 0.93 0.95 0.83 0.87 0.78  CALCIUM 8.9   < > 8.1* 8.0* 7.5* 7.6* 8.0*  AST 27  --   --   --   --   --   --   ALT 15  --   --   --   --   --   --   ALKPHOS 56  --   --   --   --   --   --   BILITOT 0.9  --   --   --   --   --   --    < > = values in this interval not displayed.   ------------------------------------------------------------------------------------------------------------------ No results for input(s): CHOL, HDL, LDLCALC, TRIG, CHOLHDL, LDLDIRECT in the last 72 hours.  Lab Results  Component Value Date   HGBA1C 6.7 (H) 04/05/2021   ------------------------------------------------------------------------------------------------------------------ Recent Labs    05/27/21 1430  TSH 0.812   ------------------------------------------------------------------------------------------------------------------ No results for input(s): VITAMINB12, FOLATE, FERRITIN, TIBC, IRON, RETICCTPCT in the last 72 hours.  Coagulation profile No results for input(s): INR, PROTIME in the last 168 hours.  No results for input(s): DDIMER in the last 72 hours.  Cardiac Enzymes No results for input(s): CKMB, TROPONINI, MYOGLOBIN in the last 168 hours.  Invalid input(s): CK ------------------------------------------------------------------------------------------------------------------ No results found for: BNP   Roxan Hockey M.D on 05/29/2021 at 2:10 PM  Go to [email or web address] - for contact info  [facility] - Office  [phone]

## 2021-05-29 NOTE — Plan of Care (Signed)
Care plan reviewed.

## 2021-05-30 ENCOUNTER — Inpatient Hospital Stay (HOSPITAL_COMMUNITY): Payer: Medicare Other

## 2021-05-30 DIAGNOSIS — I482 Chronic atrial fibrillation, unspecified: Secondary | ICD-10-CM

## 2021-05-30 DIAGNOSIS — G9341 Metabolic encephalopathy: Secondary | ICD-10-CM

## 2021-05-30 DIAGNOSIS — R531 Weakness: Secondary | ICD-10-CM

## 2021-05-30 LAB — RENAL FUNCTION PANEL
Albumin: 2.8 g/dL — ABNORMAL LOW (ref 3.5–5.0)
Anion gap: 4 — ABNORMAL LOW (ref 5–15)
BUN: 18 mg/dL (ref 8–23)
CO2: 23 mmol/L (ref 22–32)
Calcium: 6.8 mg/dL — ABNORMAL LOW (ref 8.9–10.3)
Chloride: 100 mmol/L (ref 98–111)
Creatinine, Ser: 0.81 mg/dL (ref 0.44–1.00)
GFR, Estimated: >60 mL/min (ref >60)
Glucose, Bld: 72 mg/dL (ref 70–99)
Phosphorus: 2.4 mg/dL — ABNORMAL LOW (ref 2.5–4.6)
Potassium: 3.5 mmol/L (ref 3.5–5.1)
Sodium: 127 mmol/L — ABNORMAL LOW (ref 135–145)

## 2021-05-30 LAB — CBC
HCT: 23.2 % — ABNORMAL LOW (ref 36.0–46.0)
Hemoglobin: 7.7 g/dL — ABNORMAL LOW (ref 12.0–15.0)
MCH: 30.1 pg (ref 26.0–34.0)
MCHC: 33.2 g/dL (ref 30.0–36.0)
MCV: 90.6 fL (ref 80.0–100.0)
Platelets: 213 10*3/uL (ref 150–400)
RBC: 2.56 MIL/uL — ABNORMAL LOW (ref 3.87–5.11)
RDW: 12.1 % (ref 11.5–15.5)
WBC: 4.9 10*3/uL (ref 4.0–10.5)
nRBC: 0 % (ref 0.0–0.2)

## 2021-05-30 LAB — GLUCOSE, CAPILLARY
Glucose-Capillary: 66 mg/dL — ABNORMAL LOW (ref 70–99)
Glucose-Capillary: 94 mg/dL (ref 70–99)
Glucose-Capillary: 121 mg/dL — ABNORMAL HIGH (ref 70–99)
Glucose-Capillary: 71 mg/dL (ref 70–99)
Glucose-Capillary: 115 mg/dL — ABNORMAL HIGH (ref 70–99)
Glucose-Capillary: 154 mg/dL — ABNORMAL HIGH (ref 70–99)

## 2021-05-30 MED ORDER — INSULIN DETEMIR 100 UNIT/ML ~~LOC~~ SOLN
5.0000 [IU] | Freq: Every day | SUBCUTANEOUS | Status: DC
  Administered 2021-05-30: 5 [IU] via SUBCUTANEOUS
  Filled 2021-05-30 (×2): qty 0.05

## 2021-05-30 NOTE — Evaluation (Signed)
Clinical/Bedside Swallow Evaluation Patient Details  Name: Rita Thomas MRN: [id number] Date of Birth: Apr 07, 1932  Today's Date: 05/30/2021 Time: SLP Start Time (ACUTE ONLY): 9373 SLP Stop Time (ACUTE ONLY): 4287 SLP Time Calculation (min) (ACUTE ONLY): 31 min  Past Medical History:  Past Medical History:  Diagnosis Date   Breast cancer ([place]) 2006   Bilateral breast ca with mets to spine   Diabetes mellitus without complication (HCC)    GERD (gastroesophageal reflux disease)    H/O hiatal hernia    Hypertension    Hypothyroidism 05/27/2021   Metastasis to spinal cord [facility])    Metastatic cancer to spine ([place]) 03/13/2013   S/P chemotherapy, time since greater than 12 weeks    S/P radiation therapy    Past Surgical History:  Past Surgical History:  Procedure Laterality Date   EYE SURGERY  1995   cataracts bilaterally   MASTECTOMY  2006   POSTERIOR LUMBAR FUSION 4 LEVEL N/A 03/06/2013   Procedure: POSTERIOR LUMBAR FUSION 4 LEVEL;  Surgeon: Winfield Cunas, MD;  Location: [facility];  Service: Neurosurgery;  Laterality: N/A;  T9-L3 posterior fusion with posterolateral arthrodesis and posterior segmental instrumentation   HPI:  87 y.o. female with a history of breast cancer with metastatic cancer to the spine status post chemotherapy and radiation, diabetes, hypothyroidism, hypertension, GERD admitted on 05/27/2021 with acute symptomatic hyponatremia with a sodium of 114 resulting in altered mentation-low sodium is most likely due to nausea vomiting and diarrhea at ALF prior to admission.Patient's daughter reports some mild baseline dementia with mild cognitive and memory deficits. Pt noted to have some coughing spells during po intake and BSE was requested.   Assessment / Plan / Recommendation Clinical Impression  Clinical swallow evaluation completed at bedside with Pt's daughter present. Pt alert, cooperative, and talkative. RN reported concerns about loose fitting lower partial. SLP  inspected and also found that the partial essentially floats along lower gums (she is missing all lower dentition which helped stabalize the prosthesis). Pt was adamant about wearing it to help her chew her foods. Pt without overt signs or symptoms of aspiration with thins, puree, and solids, however she did need reminders to refrain from speaking while eating. Pt was presented with meats with lower partial placed and also without placement. She appeared to consume the meat more efficiently without the lower partial in place, however Pt adamant about wearing it for meals. She further indicates that it stays in place better with use of denture cream. Pt with a single slight cough during po trials during the evaluation. Both the Pt and her daughter indicate that Pt occasionally coughs after meals and brings up phlegm. SLP reviewed reflux precautions and recommendations to sit fully upright for all po intake and for 30 minutes following. Recommend D3/mech soft and thin liquids with po medications whole with water. No further SLP services indicated at this time. SLP Visit Diagnosis: Dysphagia, unspecified (R13.10)    Aspiration Risk  Mild aspiration risk    Diet Recommendation Dysphagia 3 (Mech soft);Thin liquid   Liquid Administration via: Cup;Straw Medication Administration: Whole meds with liquid Supervision: Patient able to self feed;Full supervision/cueing for compensatory strategies Compensations: Minimize environmental distractions;Slow rate Postural Changes: Seated upright at 90 degrees;Remain upright for at least 30 minutes after po intake    Other  Recommendations Oral Care Recommendations: Oral care BID;Staff/trained caregiver to provide oral care Other Recommendations: Clarify dietary restrictions   Follow up Recommendations None;24 hour supervision/assistance  Frequency and Duration            Prognosis Prognosis for Safe Diet Advancement: Good      Swallow Study   General  Date of Onset: 05/27/21 HPI: 87 y.o. female with a history of breast cancer with metastatic cancer to the spine status post chemotherapy and radiation, diabetes, hypothyroidism, hypertension, GERD admitted on 05/27/2021 with acute symptomatic hyponatremia with a sodium of 114 resulting in altered mentation-low sodium is most likely due to nausea vomiting and diarrhea at ALF prior to admission.Patient's daughter reports some mild baseline dementia with mild cognitive and memory deficits. Pt noted to have some coughing spells during po intake and BSE was requested. Type of Study: Bedside Swallow Evaluation Previous Swallow Assessment: N/A Diet Prior to this Study: Dysphagia 3 (soft);Thin liquids Temperature Spikes Noted: No Respiratory Status: Room air History of Recent Intubation: No Behavior/Cognition: Alert;Cooperative;Pleasant mood Oral Cavity Assessment: Within Functional Limits (ill fitting lower partial) Oral Care Completed by SLP: Recent completion by staff Oral Cavity - Dentition: Dentures, top (loose fitting lower partial) Vision: Functional for self-feeding Self-Feeding Abilities: Able to feed self Patient Positioning: Upright in bed Baseline Vocal Quality: Normal Volitional Cough: Strong Volitional Swallow: Able to elicit    Oral/Motor/Sensory Function Overall Oral Motor/Sensory Function: Within functional limits   Ice Chips Ice chips: Within functional limits Presentation: Spoon   Thin Liquid Thin Liquid: Within functional limits Presentation: Cup;Self Fed;Straw    Nectar Thick Nectar Thick Liquid: Not tested   Honey Thick Honey Thick Liquid: Not tested   Puree Puree: Within functional limits Presentation: Spoon   Solid     Solid: Impaired Presentation: Spoon Oral Phase Impairments: Impaired mastication Oral Phase Functional Implications: Prolonged oral transit;Oral residue     Thank you,  Genene Churn, [place]  Eldra Word 05/30/2021,1:26  PM

## 2021-05-30 NOTE — TOC Initial Note (Signed)
Transition of Care [facility]) - Initial/Assessment Note    Patient Details  Name: Rita Thomas MRN: [id number] Date of Birth: 1932-04-04  [facility]) CM/SW Contact:    Salome Arnt, [place] Phone Number: 05/30/2021, 11:08 AM  Clinical Narrative:   Pt admitted due to acute hyponatremia. LCSW completed assessment with pt's daughter as pt is oriented to self and place only per chart. Pt's daughter reports pt recently d/c from [facility] to [facility]. She was active with [facility] there. Per daughter, pt has to be able to ambulate in order to return to [facility]. PT recommending SNF at this time- pt unable to ambulate today. Discussed with daughter who is agreeable to starting SNF referral. However, pt would only be interested in [facility] again. If pt is able to ambulate prior to d/c, they request return to [facility]. TOC will start SNF referral to [facility].                 Expected Discharge Plan: [place] Barriers to Discharge: Continued Medical Work up   Patient Goals and CMS Choice     Choice offered to / list presented to : Adult Children  Expected Discharge Plan and Services Expected Discharge Plan: [place] In-house Referral: Clinical Social Work   Post Acute Care Choice:  (TBD) Living arrangements for the past 2 months: [place], [place]                 DME Arranged: N/A                    Prior Living Arrangements/Services Living arrangements for the past 2 months: [place], [place] Lives with:: Facility Resident Patient language and need for interpreter reviewed:: Yes        Need for Family Participation in Patient Care: Yes (Comment) Care giver support system in place?: Yes (comment) Current home services: Home PT Criminal Activity/Legal Involvement Pertinent to Current Situation/Hospitalization: No - Comment as needed  Activities  of Daily Living Home Assistive Devices/Equipment: Cane (specify quad or straight), Walker (specify type) ADL Screening (condition at time of admission) Patient's cognitive ability adequate to safely complete daily activities?: No Is the patient deaf or have difficulty hearing?: Yes Does the patient have difficulty seeing, even when wearing glasses/contacts?: Yes Does the patient have difficulty concentrating, remembering, or making decisions?: Yes Patient able to express need for assistance with ADLs?: Yes Does the patient have difficulty dressing or bathing?: Yes Independently performs ADLs?: No Communication: Independent Dressing (OT): Needs assistance Is this a change from baseline?: Pre-admission baseline Grooming: Needs assistance Is this a change from baseline?: Pre-admission baseline Feeding: Independent Bathing: Needs assistance Is this a change from baseline?: Pre-admission baseline Toileting: Needs assistance Is this a change from baseline?: Pre-admission baseline In/Out Bed: Independent with device (comment) Walks in Home: Independent with device (comment) Does the patient have difficulty walking or climbing stairs?: Yes Weakness of Legs: Both Weakness of Arms/Hands: Left  Permission Sought/Granted                  Emotional Assessment       Orientation: : Oriented to Place, Oriented to Self Alcohol / Substance Use: Not Applicable Psych Involvement: No (comment)  Admission diagnosis:  Acute hyponatremia [E87.1] Hyponatremia [E87.1] Weakness [R53.1] Nausea and vomiting, intractability of vomiting not specified, unspecified vomiting type [R11.2] Patient Active Problem List   Diagnosis Date Noted   Chronic a-fib ([place]) 05/29/2021  Pressure injury of skin 95/36/9223   Acute metabolic encephalopathy-due to hyponatremia 05/28/2021   Acute hyponatremia 05/27/2021   Hypothyroidism 05/27/2021   Type 2 diabetes with nephropathy ([place])    CKD stage 4 due to type 2  diabetes mellitus [facility])    Physical deconditioning    SVT (supraventricular tachycardia) ([place])    AKI (acute kidney injury) ([place]) 04/06/2021   Hypomagnesemia    Rhabdomyolysis 04/05/2021   Metastatic cancer to spine ([place]) 03/13/2013   PCP:  Neale Burly, MD Pharmacy:   [facility] [place] [address] [place] [address] Phone: [phone] Fax: [phone]     Social Determinants of Health (SDOH) Interventions    Readmission Risk Interventions No flowsheet data found.

## 2021-05-30 NOTE — NC FL2 (Signed)
[place] LEVEL OF CARE SCREENING TOOL     IDENTIFICATION  Patient Name: Rita Thomas Birthdate: 06-13-1932 Sex: female Admission Date (Current Location): 05/27/2021  [place] and [place] Number:  [place] [facility] [id number] [place] and Address:  [facility] [address], [place]      Provider Number: [id number]  Attending Physician Name and Address:  Barton Dubois, MD  Relative Name and Phone Number:       Current Level of Care: Hospital Recommended Level of Care: [place] Prior Approval Number:    Date Approved/Denied:   PASRR Number: [id number]  Discharge Plan: SNF    Current Diagnoses: Patient Active Problem List   Diagnosis Date Noted   Chronic a-fib ([place]) 05/29/2021   Pressure injury of skin 06/23/3006   Acute metabolic encephalopathy-due to hyponatremia 05/28/2021   Acute hyponatremia 05/27/2021   Hypothyroidism 05/27/2021   Type 2 diabetes with nephropathy ([place])    CKD stage 4 due to type 2 diabetes mellitus ([place])    Physical deconditioning    SVT (supraventricular tachycardia) ([place])    AKI (acute kidney injury) ([place]) 04/06/2021   Hypomagnesemia    Rhabdomyolysis 04/05/2021   Metastatic cancer to spine ([place]) 03/13/2013    Orientation RESPIRATION BLADDER Height & Weight     Self, Place  Normal External catheter Weight: 144 lb 10 oz (65.6 kg) Height:  4\' 8"  (142.2 cm)  BEHAVIORAL SYMPTOMS/MOOD NEUROLOGICAL BOWEL NUTRITION STATUS      Continent Diet (Dysphagia 3 with thin liquids. See d/c summary for updates.)  AMBULATORY STATUS COMMUNICATION OF NEEDS Skin   Extensive Assist Verbally Bruising (Stage I to buttocks)                       Personal Care Assistance Level of Assistance  Bathing, Dressing, Feeding Bathing Assistance: Maximum assistance Feeding assistance: Limited assistance Dressing Assistance: Maximum assistance     Functional Limitations Info  Sight, Hearing, Speech Sight  Info: Adequate Hearing Info: Impaired Speech Info: Adequate    SPECIAL CARE FACTORS FREQUENCY  PT (By licensed PT)     PT Frequency: 5x weekly              Contractures      Additional Factors Info  Code Status, Allergies Code Status Info: DNR Allergies Info: Morphine and Related           Current Medications (05/30/2021):  This is the current hospital active medication list Current Facility-Administered Medications  Medication Dose Route Frequency Provider Last Rate Last Admin   0.9 %  sodium chloride infusion   Intravenous Continuous Emokpae, Courage, MD 75 mL/hr at 05/30/21 0543 Infusion Verify at 05/30/21 0543   acetaminophen (TYLENOL) tablet 650 mg  650 mg Oral Q6H PRN Adefeso, [place], DO   650 mg at 05/29/21 2234   atorvastatin (LIPITOR) tablet 10 mg  10 mg Oral Daily Truett Mainland, DO   10 mg at 05/30/21 6226   Chlorhexidine Gluconate Cloth 2 % PADS 6 each  6 each Topical Q0600 Truett Mainland, DO   6 each at 05/30/21 0543   enoxaparin (LOVENOX) injection 40 mg  40 mg Subcutaneous Q24H Truett Mainland, DO   40 mg at 05/29/21 2236   exemestane (AROMASIN) tablet 25 mg  25 mg Oral Daily Truett Mainland, DO   25 mg at 05/30/21 1008   guaiFENesin-dextromethorphan (ROBITUSSIN DM) 100-10 MG/5ML syrup 5 mL  5 mL Oral Q4H PRN Emokpae, Courage,  MD       insulin aspart (novoLOG) injection 0-5 Units  0-5 Units Subcutaneous QHS Stinson, Jacob J, DO       insulin aspart (novoLOG) injection 0-9 Units  0-9 Units Subcutaneous TID WC Truett Mainland, DO   2 Units at 05/29/21 1730   insulin detemir (LEVEMIR) injection 5 Units  5 Units Subcutaneous QHS Barton Dubois, MD       levothyroxine (SYNTHROID) tablet 50 mcg  50 mcg Oral Q0600 Truett Mainland, DO   50 mcg at 05/30/21 0542   ondansetron [facility]) injection 4 mg  4 mg Intravenous Once Stinson, Jacob J, DO       ondansetron [facility]) tablet 4 mg  4 mg Oral Q6H PRN Truett Mainland, DO       Or   ondansetron [facility])  injection 4 mg  4 mg Intravenous Q6H PRN Truett Mainland, DO       pantoprazole (PROTONIX) EC tablet 40 mg  40 mg Oral Daily Truett Mainland, DO   40 mg at 05/30/21 1610   sodium chloride tablet 1 g  1 g Oral BID WC Emokpae, Courage, MD   1 g at 05/30/21 0900     Discharge Medications: Please see discharge summary for a list of discharge medications.  Relevant Imaging Results:  Relevant Lab Results:   Additional Information SSN: [id number]. Per daughter, pt has had COVID vaccines, including 2 boosters.  Salome Arnt, LCSW

## 2021-05-30 NOTE — Plan of Care (Signed)
  Problem: Acute Rehab PT Goals(only PT should resolve) Goal: Pt Will Go Supine/Side To Sit Outcome: Progressing Flowsheets (Taken 05/30/2021 1405) Pt will go Supine/Side to Sit:  with min guard assist  with minimal assist Goal: Patient Will Transfer Sit To/From Stand Outcome: Progressing Flowsheets (Taken 05/30/2021 1405) Patient will transfer sit to/from stand:  with minimal assist  with moderate assist Goal: Pt Will Transfer Bed To Chair/Chair To Bed Outcome: Progressing Flowsheets (Taken 05/30/2021 1405) Pt will Transfer Bed to Chair/Chair to Bed: with mod assist Goal: Pt Will Ambulate Outcome: Progressing Flowsheets (Taken 05/30/2021 1405) Pt will Ambulate:  15 feet  with moderate assist  with rolling walker   2:06 PM, 05/30/21 Lonell Grandchild, MPT Physical Therapist with [facility] [phone] office [phone] mobile phone

## 2021-05-30 NOTE — Progress Notes (Signed)
Patient Demographics:    Rita Thomas, is a 87 y.o. female, DOB - 01/06/1932, OBS:[id number]  Admit date - 05/27/2021   Admitting Physician Truett Mainland, DO  Outpatient Primary MD for the patient is Neale Burly, MD  LOS - 3   Chief Complaint  Patient presents with   Weakness        Subjective:    Gwen Pounds in no major distress; generally weak and deconditioned; having difficulties standing and transferring. Reports no CP, no nausea, no vomiting. Positive mild hypoglycemia, Sodium still < 130 and experiencing coughing spells while eating.    Assessment  & Plan :    Principal Problem:   Acute hyponatremia Active Problems:   Metastatic cancer to spine (HCC)   Type 2 diabetes with nephropathy (HCC)   CKD stage 4 due to type 2 diabetes mellitus (HCC)   Hypothyroidism   Pressure injury of skin   Acute metabolic encephalopathy-due to hyponatremia   Chronic a-fib [facility])  Brief Summary:- 87 y.o. female with a history of breast cancer with metastatic cancer to the spine status post chemotherapy and radiation, diabetes, hypothyroidism, hypertension, GERD admitted on 05/27/2021 with acute symptomatic hyponatremia with a sodium of 114 resulting in altered mentation-low sodium is most likely due to nausea vomiting and diarrhea at ALF prior to admission  A/p 1)Acute Symptomatic Hyponatremia---  Na 114 >> 122 >> 127 -No concerns for seizures and with improved mentation currently. -With 3% saline solution --- sodium is now in the 120 range -stopped 3% saline and transitioned to normal saline at 75 mL an hour now. -Continue to monitor serial sodiums and adjust fluid consistency and rate accordingly -continue sodium tablet BID.  2)Acute Metabolic Encephalopathy--- secondary to #1 above -Patient's daughter reports some mild baseline dementia with mild cognitive and memory deficits -mentation  appears to be back to baseline.  3)Chronic Atrial Fibrillation--no anticoagulation PTA, -BP soft, but stable -continue holding metoprolol for now.  4)Nausea vomiting and diarrhea----no further emesis no further diarrhea continue to monitor, tolerating oral intake fairly well, except for coughing spells while eating. -speech therapy consulted.  5)Breast cancer with metastatic disease -treated with chemoradiation -continue follow up with oncology service   6)DM2-A1c 6.7 reflecting excellent diabetic control PTA -mild hypoglycemia this morning; will adjust levemir to 5units QHS -continue the use Novolog/Humalog Sliding scale insulin with Accu-Cheks/Fingersticks as ordered.  7)Hypothyroidism- -continue levothyroxine, TSH WNL  8) chronic normocytic and normochromic anemia---- -recent drop in H&H is most likely due to hemodilution from IV fluids -No overt bleeding appreciated -continue follow Hgb trend intermittently   9) generalized weakness and deconditioning----admitted from ALF/family care home--- she will have to be independent with ambulation to return the ER otherwise she will need SNF rehab -follow recommendations by PT eval -TOC aware and ready to help with placement base on rec's.  Disposition/Need for in-Hospital Stay- patient unable to be discharged at this time due to --severe symptomatic hyponatremia with significant acute metabolic encephalopathy requiring IV fluids*  Status is: Inpatient  Remains inpatient appropriate because: Please see disposition above  Disposition: The patient is from: ALF/family care home              Anticipated d/c is to: ALF with HH Vs SNF, pending  PT evaluation.              Anticipated d/c date is: 2 days              Patient currently is not medically stable to d/c. Barriers: Not Clinically Stable-   Code Status :   Code Status: DNR   Family Communication: I have updated patient's daughter at bedside on 05/30/2021  Consults  :   DVT  Prophylaxis  :   - SCDs   enoxaparin (LOVENOX) injection 40 mg Start: 05/27/21 2215    Lab Results  Component Value Date   PLT 213 05/30/2021    Inpatient Medications  Scheduled Meds:  atorvastatin  10 mg Oral Daily   Chlorhexidine Gluconate Cloth  6 each Topical Q0600   enoxaparin (LOVENOX) injection  40 mg Subcutaneous Q24H   exemestane  25 mg Oral Daily   insulin aspart  0-5 Units Subcutaneous QHS   insulin aspart  0-9 Units Subcutaneous TID WC   insulin detemir  5 Units Subcutaneous QHS   levothyroxine  50 mcg Oral Q0600   ondansetron (ZOFRAN) IV  4 mg Intravenous Once   pantoprazole  40 mg Oral Daily   sodium chloride  1 g Oral BID WC   Continuous Infusions:  sodium chloride 75 mL/hr at 05/30/21 0543   PRN Meds:.acetaminophen, guaiFENesin-dextromethorphan, ondansetron **OR** ondansetron (ZOFRAN) IV   Anti-infectives (From admission, onward)    None         Objective:   Vitals:   05/30/21 0500 05/30/21 0600 05/30/21 0700 05/30/21 0900  BP: (!) 128/92 (!) 118/93 (!) 114/57 140/78  Pulse: (!) 59 (!) 45 62 (!) 108  Resp: (!) 26 (!) 21 19 (!) 21  Temp: (!) 97 F (36.1 C)   98 F (36.7 C)  TempSrc: Axillary   Oral  SpO2: 99% 98% 100% 100%  Weight: 65.6 kg     Height:        Wt Readings from Last 3 Encounters:  05/30/21 65.6 kg  04/05/21 59 kg  03/16/13 58.1 kg     Intake/Output Summary (Last 24 hours) at 05/30/2021 1057 Last data filed at 05/30/2021 0900 Gross per 24 hour  Intake 1739.65 ml  Output 450 ml  Net 1289.65 ml    Physical Exam General exam: Alert, awake, oriented x 2; following commands appropriately and reporting no CP, nausea or vomiting. While eating breakfast she experienced some coughing spells events (suggesting aspiration) and was hypoglycemic earlier.  Respiratory system: positive rhonchi, no wheezing currently. No using accessory muscles and with good O2 sat on RA. Cardiovascular system:RRR. No murmurs, rubs, gallops. Right sided  Port-A-Cath in place. Gastrointestinal system: Abdomen is nondistended, soft and nontender. No organomegaly or masses felt. Normal bowel sounds heard. Central nervous system: Alert and oriented. No focal neurological deficits. Generally weak. Extremities: No cyanosis or clubbing. Chronic LUE swelling with arm compression sleeve in place.  Skin: No petechiae Psychiatry: Mood & affect appropriate.    Data Review:   Micro Results Recent Results (from the past 240 hour(s))  Resp Panel by RT-PCR (Flu A&B, Covid) Nasopharyngeal Swab     Status: None   Collection Time: 05/27/21  4:07 PM   Specimen: Nasopharyngeal Swab; Nasopharyngeal(NP) swabs in vial transport medium  Result Value Ref Range Status   SARS Coronavirus 2 by RT PCR NEGATIVE NEGATIVE Final    Comment: (NOTE) SARS-CoV-2 target nucleic acids are NOT DETECTED.  The SARS-CoV-2 RNA is generally detectable in upper respiratory specimens  during the acute phase of infection. The lowest concentration of SARS-CoV-2 viral copies this assay can detect is 138 copies/mL. A negative result does not preclude SARS-Cov-2 infection and should not be used as the sole basis for treatment or other patient management decisions. A negative result may occur with  improper specimen collection/handling, submission of specimen other than nasopharyngeal swab, presence of viral mutation(s) within the areas targeted by this assay, and inadequate number of viral copies(<138 copies/mL). A negative result must be combined with clinical observations, patient history, and epidemiological information. The expected result is Negative.  Fact Sheet for Patients:  EntrepreneurPulse.com.au  Fact Sheet for Healthcare Providers:  IncredibleEmployment.be  This test is no t yet approved or cleared by the [place] FDA and  has been authorized for detection and/or diagnosis of SARS-CoV-2 by FDA under an Emergency Use  Authorization (EUA). This EUA will remain  in effect (meaning this test can be used) for the duration of the COVID-19 declaration under Section 564(b)(1) of the Act, 21 U.S.C.section 360bbb-3(b)(1), unless the authorization is terminated  or revoked sooner.       Influenza A by PCR NEGATIVE NEGATIVE Final   Influenza B by PCR NEGATIVE NEGATIVE Final    Comment: (NOTE) The Xpert Xpress SARS-CoV-2/FLU/RSV plus assay is intended as an aid in the diagnosis of influenza from Nasopharyngeal swab specimens and should not be used as a sole basis for treatment. Nasal washings and aspirates are unacceptable for Xpert Xpress SARS-CoV-2/FLU/RSV testing.  Fact Sheet for Patients: EntrepreneurPulse.com.au  Fact Sheet for Healthcare Providers: IncredibleEmployment.be  This test is not yet approved or cleared by the [place] FDA and has been authorized for detection and/or diagnosis of SARS-CoV-2 by FDA under an Emergency Use Authorization (EUA). This EUA will remain in effect (meaning this test can be used) for the duration of the COVID-19 declaration under Section 564(b)(1) of the Act, 21 U.S.C. section 360bbb-3(b)(1), unless the authorization is terminated or revoked.  Performed at [facility], [address] [place], [place] [address]   MRSA Next Gen by PCR, Nasal     Status: None   Collection Time: 05/28/21  8:51 PM   Specimen: Nasal Mucosa; Nasal Swab  Result Value Ref Range Status   MRSA by PCR Next Gen NOT DETECTED NOT DETECTED Final    Comment: (NOTE) The GeneXpert MRSA Assay (FDA approved for NASAL specimens only), is one component of a comprehensive MRSA colonization surveillance program. It is not intended to diagnose MRSA infection nor to guide or monitor treatment for MRSA infections. Test performance is not FDA approved in patients less than 70 years old. Performed at [facility], [address], [place], [place] [address]      Radiology Reports DG Abdomen Acute W/Chest  Result Date: 05/27/2021 CLINICAL DATA:  Nausea, vomiting. EXAM: DG ABDOMEN ACUTE WITH 1 VIEW CHEST COMPARISON:  None. FINDINGS: There is no evidence of dilated bowel loops or free intraperitoneal air. No radiopaque calculi or other significant radiographic abnormality is seen. Heart size and mediastinal contours are within normal limits. Both lungs are clear. IMPRESSION: No evidence of bowel obstruction or ileus. No acute cardiopulmonary disease. Aortic Atherosclerosis (ICD10-I70.0). Electronically Signed   By: Marijo Conception M.D.   On: 05/27/2021 15:51     CBC Recent Labs  Lab 05/27/21 1430 05/29/21 0451 05/30/21 0436  WBC 5.2 6.0 4.9  HGB 11.0* 9.8* 7.7*  HCT 32.0* 28.0* 23.2*  PLT 266 192 213  MCV 87.7 87.5 90.6  MCH  30.1 30.6 30.1  MCHC 34.4 35.0 33.2  RDW 11.9 11.9 12.1  LYMPHSABS 0.5*  --   --   MONOABS 0.4  --   --   EOSABS 0.1  --   --   BASOSABS 0.0  --   --     Chemistries  Recent Labs  Lab 05/27/21 1430 05/27/21 1948 05/28/21 1613 05/28/21 2254 05/29/21 0451 05/29/21 1550 05/30/21 0436  NA 114*   < > 119* 119* 122* 122* 127*  K 4.3   < > 3.9 3.9 4.1 3.6 3.5  CL 77*   < > 90* 91* 90* 95* 100  CO2 27   < > 23 21* 22 22 23   GLUCOSE 174*   < > 155* 179* 84 129* 72  BUN 19   < > 18 16 15 18 18   CREATININE 0.95   < > 0.83 0.87 0.78 0.85 0.81  CALCIUM 8.9   < > 7.5* 7.6* 8.0* 6.9* 6.8*  AST 27  --   --   --   --   --   --   ALT 15  --   --   --   --   --   --   ALKPHOS 56  --   --   --   --   --   --   BILITOT 0.9  --   --   --   --   --   --    < > = values in this interval not displayed.    Lab Results  Component Value Date   HGBA1C 6.7 (H) 04/05/2021   ------------------------------------------------------------------------------------------------------------------ Recent Labs    05/27/21 1430  TSH 0.812    Barton Dubois M.D on 05/30/2021 at 10:57 AM  Go to [email or web address] - for contact info  [facility] - Office  [phone]

## 2021-05-30 NOTE — Progress Notes (Signed)
Inpatient Diabetes Program Recommendations  AACE/ADA: New Consensus Statement on Inpatient Glycemic Control   Target Ranges:  Prepandial:   less than 140 mg/dL      Peak postprandial:   less than 180 mg/dL (1-2 hours)      Critically ill patients:  140 - 180 mg/dL   Results for Rita Thomas, Rita Thomas (MRN [id number]) as of 05/30/2021 08:51  Ref. Range 05/29/2021 07:39 05/29/2021 11:13 05/29/2021 15:14 05/29/2021 16:31 05/29/2021 21:33 05/30/2021 07:59 05/30/2021 08:44  Glucose-Capillary Latest Ref Range: 70 - 99 mg/dL 92 201 (H) 98 162 (H) 108 (H) 66 (L) 94   Review of Glycemic Control  Diabetes history: DM2 Outpatient Diabetes medications: Januvia 25 mg daily Current orders for Inpatient glycemic control: Levemir 10 units QHS, Novolog 0-9 units TID with meals, Novolog 0-5 units QHS  Inpatient Diabetes Program Recommendations:    Insulin: Please consider decreasing Levemir to 5 units QHS.  Thanks, Barnie Alderman, RN, MSN, CDE Diabetes Coordinator Inpatient Diabetes Program [phone] (Team Pager from 8am to 5pm)

## 2021-05-30 NOTE — Evaluation (Signed)
Physical Therapy Evaluation Patient Details Name: Rita Thomas MRN: [id number] DOB: Oct 17, 1931 Today's Date: 05/30/2021   History of Present Illness  Rita Thomas is a 87 y.o. female with a history of breast cancer with metastatic cancer to the spine status post chemotherapy and radiation, diabetes, hypothyroidism, hypertension, GERD.  She presents due to weakness, nausea and vomiting and diarrhea.  Yesterday, she had been feeling fine without any problems.  Today, she started having difficulty with ambulation and had multiple episodes of nausea and vomiting and an episode of diarrhea.  Denies fevers, chills, chest pain, shortness of breath, abdominal pain.  She came to the hospital for evaluation as things were worsening.  She was intolerant of oral food and liquids, which appeared to make her symptoms worse.   Clinical Impression  Patient demonstrates slow labored movement for sitting up at bedside with most difficulty scooting to EOB, very unsteady on feet due to feet sliding forward when attempting sit to stands and required Max assist to maintain standing balance.  Patient put back to bed after therapy.  Patient will benefit from continued physical therapy in hospital and recommended venue below to increase strength, balance, endurance for safe ADLs and gait.      Follow Up Recommendations SNF;Supervision for mobility/OOB;Supervision - Intermittent    Equipment Recommendations  None recommended by PT    Recommendations for Other Services       Precautions / Restrictions Precautions Precautions: Fall Restrictions Weight Bearing Restrictions: No      Mobility  Bed Mobility Overal bed mobility: Needs Assistance Bed Mobility: Sit to Supine       Sit to supine: Min assist;Mod assist   General bed mobility comments: increased time, labored movement    Transfers Overall transfer level: Needs assistance Equipment used: Rolling walker (2 wheeled) Transfers: Sit to/from  Stand Sit to Stand: Max assist         General transfer comment: unsteady on feet with feet sliding forward when attempting sit to stands  Ambulation/Gait                Stairs            Wheelchair Mobility    Modified Rankin (Stroke Patients Only)       Balance Overall balance assessment: Needs assistance Sitting-balance support: Feet supported;No upper extremity supported Sitting balance-Leahy Scale: Fair Sitting balance - Comments: seated at EOB   Standing balance support: During functional activity;Bilateral upper extremity supported Standing balance-Leahy Scale: Poor Standing balance comment: using RW                             Pertinent Vitals/Pain Pain Assessment: No/denies pain    Home Living Family/patient expects to be discharged to:: Private residence Living Arrangements: Non-relatives/Friends;Other relatives Available Help at Discharge: Family;Personal care attendant;Available 24 hours/day Type of Home: House Home Access: Ramped entrance     Home Layout: Two level Home Equipment: Walker - 2 wheels;Hospital bed;Grab bars - tub/shower;Shower seat - built in;Cane - single point;Hand held shower head;Wheelchair - manual;Bedside commode      Prior Function Level of Independence: Needs assistance   Gait / Transfers Assistance Needed: Assisted household Geologist, engineering, assisted for transfers  ADL's / Homemaking Assistance Needed: assisted by personal care attendant        Hand Dominance   Dominant Hand: Right    Extremity/Trunk Assessment   Upper Extremity Assessment Upper Extremity Assessment: Generalized weakness  Lower Extremity Assessment Lower Extremity Assessment: Generalized weakness    Cervical / Trunk Assessment Cervical / Trunk Assessment: Kyphotic  Communication   Communication: No difficulties  Cognition Arousal/Alertness: Awake/alert Behavior During Therapy: WFL for tasks  assessed/performed Overall Cognitive Status: Within Functional Limits for tasks assessed                                        General Comments      Exercises     Assessment/Plan    PT Assessment Patient needs continued PT services  PT Problem List Decreased strength;Decreased activity tolerance;Decreased balance;Decreased mobility       PT Treatment Interventions DME instruction;Gait training;Functional mobility training;Therapeutic activities;Therapeutic exercise;Balance training;Patient/family education;Wheelchair mobility training    PT Goals (Current goals can be found in the Care Plan section)  Acute Rehab PT Goals Patient Stated Goal: return home with personal care attendants to assist PT Goal Formulation: With patient Time For Goal Achievement: 06/13/21 Potential to Achieve Goals: Good    Frequency Min 3X/week   Barriers to discharge        Co-evaluation               AM-PAC PT "6 Clicks" Mobility  Outcome Measure Help needed turning from your back to your side while in a flat bed without using bedrails?: A Little Help needed moving from lying on your back to sitting on the side of a flat bed without using bedrails?: A Lot Help needed moving to and from a bed to a chair (including a wheelchair)?: Total Help needed standing up from a chair using your arms (e.g., wheelchair or bedside chair)?: A Lot Help needed to walk in hospital room?: Total Help needed climbing 3-5 steps with a railing? : Total 6 Click Score: 10    End of Session   Activity Tolerance: Patient tolerated treatment well;Patient limited by fatigue Patient left: in bed;with call bell/phone within reach Nurse Communication: Mobility status PT Visit Diagnosis: Unsteadiness on feet (R26.81);Other abnormalities of gait and mobility (R26.89);Muscle weakness (generalized) (M62.81)    Time: 6948-5462 PT Time Calculation (min) (ACUTE ONLY): 31 min   Charges:   PT  Evaluation $PT Eval Moderate Complexity: 1 Mod PT Treatments $Therapeutic Activity: 23-37 mins        2:03 PM, 05/30/21 Lonell Grandchild, MPT Physical Therapist with [facility] 336 [phone] office [phone] mobile phone

## 2021-05-31 ENCOUNTER — Inpatient Hospital Stay (HOSPITAL_COMMUNITY): Payer: Medicare Other

## 2021-05-31 DIAGNOSIS — I471 Supraventricular tachycardia: Secondary | ICD-10-CM | POA: Diagnosis not present

## 2021-05-31 DIAGNOSIS — M6281 Muscle weakness (generalized): Secondary | ICD-10-CM | POA: Diagnosis not present

## 2021-05-31 DIAGNOSIS — N184 Chronic kidney disease, stage 4 (severe): Secondary | ICD-10-CM | POA: Diagnosis not present

## 2021-05-31 DIAGNOSIS — R531 Weakness: Secondary | ICD-10-CM | POA: Diagnosis not present

## 2021-05-31 DIAGNOSIS — E871 Hypo-osmolality and hyponatremia: Secondary | ICD-10-CM | POA: Diagnosis not present

## 2021-05-31 DIAGNOSIS — Z20822 Contact with and (suspected) exposure to covid-19: Secondary | ICD-10-CM | POA: Diagnosis not present

## 2021-05-31 DIAGNOSIS — C7951 Secondary malignant neoplasm of bone: Secondary | ICD-10-CM | POA: Diagnosis not present

## 2021-05-31 DIAGNOSIS — G9341 Metabolic encephalopathy: Secondary | ICD-10-CM | POA: Diagnosis not present

## 2021-05-31 DIAGNOSIS — F0390 Unspecified dementia without behavioral disturbance: Secondary | ICD-10-CM | POA: Diagnosis not present

## 2021-05-31 DIAGNOSIS — L89321 Pressure ulcer of left buttock, stage 1: Secondary | ICD-10-CM | POA: Diagnosis not present

## 2021-05-31 DIAGNOSIS — E7849 Other hyperlipidemia: Secondary | ICD-10-CM | POA: Diagnosis not present

## 2021-05-31 DIAGNOSIS — Z66 Do not resuscitate: Secondary | ICD-10-CM | POA: Diagnosis not present

## 2021-05-31 DIAGNOSIS — L89311 Pressure ulcer of right buttock, stage 1: Secondary | ICD-10-CM | POA: Diagnosis not present

## 2021-05-31 DIAGNOSIS — E039 Hypothyroidism, unspecified: Secondary | ICD-10-CM | POA: Diagnosis not present

## 2021-05-31 DIAGNOSIS — I10 Essential (primary) hypertension: Secondary | ICD-10-CM | POA: Diagnosis not present

## 2021-05-31 DIAGNOSIS — L89301 Pressure ulcer of unspecified buttock, stage 1: Secondary | ICD-10-CM

## 2021-05-31 DIAGNOSIS — G459 Transient cerebral ischemic attack, unspecified: Secondary | ICD-10-CM | POA: Diagnosis not present

## 2021-05-31 DIAGNOSIS — E1142 Type 2 diabetes mellitus with diabetic polyneuropathy: Secondary | ICD-10-CM | POA: Diagnosis not present

## 2021-05-31 DIAGNOSIS — G319 Degenerative disease of nervous system, unspecified: Secondary | ICD-10-CM | POA: Diagnosis not present

## 2021-05-31 DIAGNOSIS — R41841 Cognitive communication deficit: Secondary | ICD-10-CM | POA: Diagnosis not present

## 2021-05-31 DIAGNOSIS — E11649 Type 2 diabetes mellitus with hypoglycemia without coma: Secondary | ICD-10-CM | POA: Diagnosis not present

## 2021-05-31 DIAGNOSIS — Z96642 Presence of left artificial hip joint: Secondary | ICD-10-CM | POA: Diagnosis not present

## 2021-05-31 DIAGNOSIS — Z8616 Personal history of COVID-19: Secondary | ICD-10-CM | POA: Diagnosis not present

## 2021-05-31 DIAGNOSIS — R2689 Other abnormalities of gait and mobility: Secondary | ICD-10-CM | POA: Diagnosis not present

## 2021-05-31 DIAGNOSIS — E1121 Type 2 diabetes mellitus with diabetic nephropathy: Secondary | ICD-10-CM | POA: Diagnosis not present

## 2021-05-31 DIAGNOSIS — Z853 Personal history of malignant neoplasm of breast: Secondary | ICD-10-CM | POA: Diagnosis not present

## 2021-05-31 DIAGNOSIS — I482 Chronic atrial fibrillation, unspecified: Secondary | ICD-10-CM | POA: Diagnosis not present

## 2021-05-31 LAB — BASIC METABOLIC PANEL
Anion gap: 8 (ref 5–15)
BUN: 12 mg/dL (ref 8–23)
CO2: 27 mmol/L (ref 22–32)
Calcium: 7.7 mg/dL — ABNORMAL LOW (ref 8.9–10.3)
Chloride: 96 mmol/L — ABNORMAL LOW (ref 98–111)
Creatinine, Ser: 0.67 mg/dL (ref 0.44–1.00)
GFR, Estimated: >60 mL/min (ref >60)
Glucose, Bld: 88 mg/dL (ref 70–99)
Potassium: 3.3 mmol/L — ABNORMAL LOW (ref 3.5–5.1)
Sodium: 131 mmol/L — ABNORMAL LOW (ref 135–145)

## 2021-05-31 LAB — GLUCOSE, CAPILLARY
Glucose-Capillary: 80 mg/dL (ref 70–99)
Glucose-Capillary: 132 mg/dL — ABNORMAL HIGH (ref 70–99)

## 2021-05-31 LAB — MAGNESIUM: Magnesium: 1 mg/dL — ABNORMAL LOW (ref 1.7–2.4)

## 2021-05-31 MED ORDER — METOPROLOL SUCCINATE ER 25 MG PO TB24: 25.0000 mg | ORAL_TABLET | Freq: Every day | ORAL | Status: AC

## 2021-05-31 MED ORDER — SODIUM CHLORIDE 1 G PO TABS: 1.0000 g | ORAL_TABLET | Freq: Two times a day (BID) | ORAL | Status: AC

## 2021-05-31 MED ORDER — GUAIFENESIN-DM 100-10 MG/5ML PO SYRP: 5.0000 mL | ORAL_SOLUTION | Freq: Four times a day (QID) | ORAL | Status: AC | PRN

## 2021-05-31 MED ORDER — METOPROLOL TARTRATE 5 MG/5ML IV SOLN
5.0000 mg | Freq: Once | INTRAVENOUS | Status: AC
  Administered 2021-05-31: 5 mg via INTRAVENOUS
  Filled 2021-05-31: qty 5

## 2021-05-31 NOTE — Progress Notes (Signed)
   05/31/21 0900  Assess: MEWS Score  BP (!) 156/95  Pulse Rate (!) 122  SpO2 100 %  O2 Device Room Air  Assess: MEWS Score  MEWS Temp 0  MEWS Systolic 0  MEWS Pulse 2  MEWS RR 0  MEWS LOC 0  MEWS Score 2  MEWS Score Color Yellow  Assess: if the MEWS score is Yellow or Red  Were vital signs taken at a resting state? Yes  Focused Assessment No change from prior assessment  Early Detection of Sepsis Score *See Row Information* Low  MEWS guidelines implemented *See Row Information* Yes  Treat  MEWS Interventions Escalated (See documentation below)  Pain Scale 0-10  Pain Score 0  Take Vital Signs  Increase Vital Sign Frequency  Yellow: Q 2hr X 2 then Q 4hr X 2, if remains yellow, continue Q 4hrs  Escalate  MEWS: Escalate Yellow: discuss with charge nurse/RN and consider discussing with provider and RRT  Notify: Charge Nurse/RN  Name of Charge Nurse/RN Notified [place], RN  Date Charge Nurse/RN Notified 05/31/21  Time Charge Nurse/RN Notified 0930  Notify: Provider  Provider Sherrian Divers, MD Attending  Date Provider Notified 05/31/21  Time Provider Notified 0930  Notification Type Page  Notification Reason Other (Comment) (pulse rate, yellow mews)

## 2021-05-31 NOTE — Progress Notes (Signed)
Report called to [facility] at [place], no further questions at this time. Awaiting arrival of EMS to transport patient.

## 2021-05-31 NOTE — Discharge Summary (Addendum)
Physician Discharge Summary  Rita Thomas YKD:[id number] DOB: 07-31-1932 DOA: 05/27/2021  PCP: Neale Burly, MD  Admit date: 05/27/2021 Discharge date: 05/31/2021  Time spent: 35 minutes  Recommendations for Outpatient Follow-up:  Repeat basic metabolic panel to follow electrolytes and renal function instability. Repeat CBC to follow hemoglobin trend. Reassess blood pressure and further adjust antihypertensive regimen as needed.   Discharge Diagnoses:  Principal Problem:   Acute hyponatremia Active Problems:   Metastatic cancer to spine ([place])   Type 2 diabetes   Hypothyroidism   Pressure injury of skin   Acute metabolic encephalopathy-due to hyponatremia   Chronic a-fib (HCC)   Weakness   Discharge Condition: Stable and improved.  Discharge to skilled nursing facility for further care and rehabilitation.  Code status: DNR  Diet recommendation: Dysphagia 3 diet.  Filed Weights   05/27/21 1349 05/29/21 0500 05/30/21 0500  Weight: 59 kg 61.9 kg 65.6 kg    History of present illness:  As per H&P written by Dr. Nehemiah Settle on 05/27/21 Rita Thomas is a 87 y.o. female with a history of breast cancer with metastatic cancer to the spine status post chemotherapy and radiation, diabetes, hypothyroidism, hypertension, GERD.  She presents due to weakness, nausea and vomiting and diarrhea.  Yesterday, she had been feeling fine without any problems.  Today, she started having difficulty with ambulation and had multiple episodes of nausea and vomiting and an episode of diarrhea.  Denies fevers, chills, chest pain, shortness of breath, abdominal pain.  She came to the hospital for evaluation as things were worsening.  She was intolerant of oral food and liquids, which appeared to make her symptoms worse.     She was recently in rehab and had tested positive for COVID.  She had been treated there although the patient and her daughter do not know what she had received.   Emergency Department  Course: Labs show hyponatremia with a sodium of 114.  CBC normal, TSH normal, COVID-negative, abdominal with chest x-ray negative  Hospital Course:  1)Acute Symptomatic Hyponatremia---  Na 114 >> 122 >> 127>> 131 -No concerns for seizures and with improved mentation currently. -Patient successfully treated with 3% saline solution and subsequent normal saline after sodium level above 120. -Repeat basic metabolic panel in 1 week to assess electrolytes stability. -continue sodium tablet BID.   2)Acute Metabolic Encephalopathy--- secondary to #1 above -Patient's daughter reports some mild baseline dementia with mild cognitive and memory deficits -mentation appears to be back to baseline.   3)Chronic Atrial Fibrillation/hypertension -no anticoagulation PTA, -continue metoprolol for rate control. -Medication will also help with controlling BP.   4)Nausea vomiting and diarrhea----no further emesis no further diarrhea  -Continue to maintain adequate hydration  -Dysphagia 3 diet with thin liquids while in upright position has been recommended. -Patient with bad dentures and missing teeth; needs mechanical soft diet.    5)Breast cancer with metastatic disease -treated with chemoradiation -continue follow up with oncology service    6)DM2-A1c 6.7 reflecting excellent diabetic control PTA -Continue modified carbohydrate diet and resumed oral hypoglycemic agents at discharge.   7)Hypothyroidism- -continue levothyroxine -TSH WNL   8) chronic normocytic and normochromic anemia---- -recent drop in H&H is most likely due to hemodilution from IV fluids -No overt bleeding appreciated -Repeat CBC 1 week to assess hemoglobin stability   9) generalized weakness and deconditioning----admitted from ALF/family care home -Physical therapy evaluation demonstrating the need for acute rehabilitation prior for patient to return to ALF. -Patient will be discharged to [facility]  [place] skilled nursing  facility for further care and rehab.  10) stage I pressure injury of skin -Appreciated in her buttocks bilaterally -Continue constant repositioning and preventive barrier cream. -Patient's pressure injury were present at time of admission.  Procedures: See below for x-ray reports.  Consultations: None  Discharge Exam: Vitals:   05/31/21 1113 05/31/21 1118  BP: (!) 153/125 (!) 146/77  Pulse: (!) 102   Resp: 20 20  Temp: 98.5 F (36.9 C)   SpO2: 98%     General: Pleasantly confused, able to follow commands, generally weak and deconditioned.  No nausea, no vomiting, no dysuria, no requiring oxygen supplementation. Cardiovascular: Irregular, no rubs, no gallops, no JVD. Respiratory: Good air movement bilaterally, no using accessory muscle.  No wheezing or crackles on exam. Abdomen: Soft, nontender, positive bowel sounds Extremities: No cyanosis or clubbing.  Discharge Instructions   Discharge Instructions     Discharge instructions   Complete by: As directed    Take medications as prescribed Maintain adequate hydration Dysphagia 3 diet (mechanical soft) while maintaining upright position to minimize aspiration. Physical therapy and rehabilitation as per the skilled nursing facility protocol. Repeat basic metabolic panel in 5 days to follow electrolytes and assure renal function and stability.   Discharge wound care:   Complete by: As directed    Constant repositioning and preventive barrier creams.      Allergies as of 05/31/2021       Reactions   Morphine And Related Other (See Comments)   "Made me crazy"        Medication List     TAKE these medications    acetaminophen 325 MG tablet Commonly known as: TYLENOL Take 100 mg by mouth 2 (two) times daily.   atorvastatin 10 MG tablet Commonly known as: LIPITOR Take 10 mg by mouth daily.   CALTRATE 600 PO Take 1 tablet by mouth daily.   exemestane 25 MG tablet Commonly known as: AROMASIN Take 25 mg by  mouth daily.   guaiFENesin-dextromethorphan 100-10 MG/5ML syrup Commonly known as: ROBITUSSIN DM Take 5 mLs by mouth every 6 (six) hours as needed for cough.   levothyroxine 50 MCG tablet Commonly known as: SYNTHROID Take 50 mcg by mouth daily.   lidocaine 5 % Commonly known as: LIDODERM Place 1 patch onto the skin daily. Remove & Discard patch within 12 hours or as directed by MD   metoprolol succinate 25 MG 24 hr tablet Commonly known as: TOPROL-XL Take 1 tablet (25 mg total) by mouth daily. What changed: how much to take   multivitamin with minerals Tabs tablet Take 1 tablet by mouth daily.   pantoprazole 40 MG tablet Commonly known as: PROTONIX Take 40 mg by mouth daily.   sitaGLIPtin 25 MG tablet Commonly known as: JANUVIA Take 1 tablet (25 mg total) by mouth daily.   sodium chloride 1 g tablet Take 1 tablet (1 g total) by mouth 2 (two) times daily with a meal.               Discharge Care Instructions  (From admission, onward)           Start     Ordered   05/31/21 0000  Discharge wound care:       Comments: Constant repositioning and preventive barrier creams.   05/31/21 1359           Allergies  Allergen Reactions   Morphine And Related Other (See Comments)    "Made me crazy"  Contact information for follow-up providers     Neale Burly, MD. Schedule an appointment as soon as possible for a visit in 10 day(s).   Specialty: Internal Medicine Why: After discharge from the skilled nursing facility. Contact information: [place] [address] [phone]              Contact information for after-discharge care     Destination     [facility] .   Service: Skilled Nursing Contact information: [address] [place] [phone]                      The results of significant diagnostics from this  hospitalization (including imaging, microbiology, ancillary and laboratory) are listed below for reference.    Significant Diagnostic Studies: CT HEAD WO CONTRAST (5MM)  Result Date: 05/31/2021 CLINICAL DATA:  TIA.  History of breast cancer 2006. EXAM: CT HEAD WITHOUT CONTRAST TECHNIQUE: Contiguous axial images were obtained from the base of the skull through the vertex without intravenous contrast. COMPARISON:  CT head 04/05/2021 FINDINGS: Brain: Generalized atrophy. Mild chronic white matter changes stable from the prior CT. Negative for acute infarct, hemorrhage, mass. Vascular: Negative for hyperdense vessel Skull: Negative Sinuses/Orbits: Mild mucosal edema right maxillary sinus. Bilateral cataract extraction Other: None IMPRESSION: No acute abnormality. Atrophy and chronic microvascular ischemic change in the white matter. Electronically Signed   By: Franchot Gallo M.D.   On: 05/31/2021 13:27   DG CHEST PORT 1 VIEW  Result Date: 05/30/2021 CLINICAL DATA:  A 87 year old female with history of shortness of breath. EXAM: PORTABLE CHEST 1 VIEW COMPARISON:  April 05, 2021. FINDINGS: RIGHT-sided Port-A-Cath remains in situ con tip in the mid superior vena cava to distal superior vena cava. EKG leads project over the chest. Spinal fusion hardware incidentally noted in the lower thoracic spine, incompletely imaged. Image rotated to the RIGHT. Added density over the RIGHT upper chest likely due to external device or redundant soft tissues. Linear opacities in the RIGHT mid chest. No lobar level consolidative process. No visible pneumothorax or sign of pleural effusion. On limited assessment no acute skeletal process. IMPRESSION: Linear opacities in the RIGHT mid chest may represent atelectasis or scarring. No sign of lobar consolidation or effusion. Electronically Signed   By: Zetta Bills M.D.   On: 05/30/2021 13:23   DG Abdomen Acute W/Chest  Result Date: 05/27/2021 CLINICAL DATA:  Nausea, vomiting.  EXAM: DG ABDOMEN ACUTE WITH 1 VIEW CHEST COMPARISON:  None. FINDINGS: There is no evidence of dilated bowel loops or free intraperitoneal air. No radiopaque calculi or other significant radiographic abnormality is seen. Heart size and mediastinal contours are within normal limits. Both lungs are clear. IMPRESSION: No evidence of bowel obstruction or ileus. No acute cardiopulmonary disease. Aortic Atherosclerosis (ICD10-I70.0). Electronically Signed   By: Marijo Conception M.D.   On: 05/27/2021 15:51    Microbiology: Recent Results (from the past 240 hour(s))  Resp Panel by RT-PCR (Flu A&B, Covid) Nasopharyngeal Swab     Status: None   Collection Time: 05/27/21  4:07 PM   Specimen: Nasopharyngeal Swab; Nasopharyngeal(NP) swabs in vial transport medium  Result Value Ref Range Status   SARS Coronavirus 2 by RT PCR NEGATIVE NEGATIVE Final    Comment: (NOTE) SARS-CoV-2 target nucleic acids are NOT DETECTED.  The SARS-CoV-2 RNA is generally detectable in upper respiratory specimens during the acute phase of  infection. The lowest concentration of SARS-CoV-2 viral copies this assay can detect is 138 copies/mL. A negative result does not preclude SARS-Cov-2 infection and should not be used as the sole basis for treatment or other patient management decisions. A negative result may occur with  improper specimen collection/handling, submission of specimen other than nasopharyngeal swab, presence of viral mutation(s) within the areas targeted by this assay, and inadequate number of viral copies(<138 copies/mL). A negative result must be combined with clinical observations, patient history, and epidemiological information. The expected result is Negative.  Fact Sheet for Patients:  EntrepreneurPulse.com.au  Fact Sheet for Healthcare Providers:  IncredibleEmployment.be  This test is no t yet approved or cleared by the [place] FDA and  has been authorized for  detection and/or diagnosis of SARS-CoV-2 by FDA under an Emergency Use Authorization (EUA). This EUA will remain  in effect (meaning this test can be used) for the duration of the COVID-19 declaration under Section 564(b)(1) of the Act, 21 U.S.C.section 360bbb-3(b)(1), unless the authorization is terminated  or revoked sooner.       Influenza A by PCR NEGATIVE NEGATIVE Final   Influenza B by PCR NEGATIVE NEGATIVE Final    Comment: (NOTE) The Xpert Xpress SARS-CoV-2/FLU/RSV plus assay is intended as an aid in the diagnosis of influenza from Nasopharyngeal swab specimens and should not be used as a sole basis for treatment. Nasal washings and aspirates are unacceptable for Xpert Xpress SARS-CoV-2/FLU/RSV testing.  Fact Sheet for Patients: EntrepreneurPulse.com.au  Fact Sheet for Healthcare Providers: IncredibleEmployment.be  This test is not yet approved or cleared by the [place] FDA and has been authorized for detection and/or diagnosis of SARS-CoV-2 by FDA under an Emergency Use Authorization (EUA). This EUA will remain in effect (meaning this test can be used) for the duration of the COVID-19 declaration under Section 564(b)(1) of the Act, 21 U.S.C. section 360bbb-3(b)(1), unless the authorization is terminated or revoked.  Performed at [facility], [address], [place], [place] [address]   MRSA Next Gen by PCR, Nasal     Status: None   Collection Time: 05/28/21  8:51 PM   Specimen: Nasal Mucosa; Nasal Swab  Result Value Ref Range Status   MRSA by PCR Next Gen NOT DETECTED NOT DETECTED Final    Comment: (NOTE) The GeneXpert MRSA Assay (FDA approved for NASAL specimens only), is one component of a comprehensive MRSA colonization surveillance program. It is not intended to diagnose MRSA infection nor to guide or monitor treatment for MRSA infections. Test performance is not FDA approved in patients less than 38  years old. Performed at [facility], [address], [place], [place] [address]      Labs: Basic Metabolic Panel: Recent Labs  Lab 05/28/21 2254 05/29/21 0451 05/29/21 1550 05/30/21 0436 05/31/21 0610  NA 119* 122* 122* 127* 131*  K 3.9 4.1 3.6 3.5 3.3*  CL 91* 90* 95* 100 96*  CO2 21* 22 22 23 27   GLUCOSE 179* 84 129* 72 88  BUN 16 15 18 18 12   CREATININE 0.87 0.78 0.85 0.81 0.67  CALCIUM 7.6* 8.0* 6.9* 6.8* 7.7*  MG  --   --   --   --  1.0*  PHOS  --  2.5  --  2.4*  --    Liver Function Tests: Recent Labs  Lab 05/27/21 1430 05/29/21 0451 05/30/21 0436  AST 27  --   --   ALT 15  --   --   ALKPHOS 56  --   --  BILITOT 0.9  --   --   PROT 7.5  --   --   ALBUMIN 4.0 3.7 2.8*   Recent Labs  Lab 05/27/21 1430  LIPASE 23   CBC: Recent Labs  Lab 05/27/21 1430 05/29/21 0451 05/30/21 0436  WBC 5.2 6.0 4.9  NEUTROABS 4.1  --   --   HGB 11.0* 9.8* 7.7*  HCT 32.0* 28.0* 23.2*  MCV 87.7 87.5 90.6  PLT 266 192 213   CBG: Recent Labs  Lab 05/30/21 1647 05/30/21 1823 05/30/21 2058 05/31/21 0738 05/31/21 1151  GLUCAP 71 115* 154* 80 132*    Signed:  Barton Dubois MD.  [facility] 05/31/2021, 2:04 PM

## 2021-05-31 NOTE — TOC Transition Note (Signed)
Transition of Care [facility]) - CM/SW Discharge Note   Patient Details  Name: Rita Thomas MRN: [id number] Date of Birth: 1932/08/14  [facility] CM/SW Contact:  Iona Beard, [place] Phone Number: 05/31/2021, 3:44 PM   Clinical Narrative:    CSW spoke with facility [facility]) who confirms that pt can arrive today, they can provide transportation for pt to the facility. Pt does not need COVID test as she recently had COVID. CSW spoke with pts daughter Clearance Coots who is aware of transition and will take pt clothes to facility. TOC signing off.    Final next level of care: Skilled Nursing Facility Barriers to Discharge: Barriers Resolved   Patient Goals and CMS Choice Patient states their goals for this hospitalization and ongoing recovery are:: SNF CMS Medicare.gov Compare Post Acute Care list provided to:: Patient Represenative (must comment) Choice offered to / list presented to : Adult Children  Discharge Placement              Patient chooses bed at: Other - please specify in the comment section below: ([facility]) Patient to be transferred to facility by: Facility transportation Name of family member notified: Clearance Coots Patient and family notified of of transfer: 05/31/21  Discharge Plan and Services In-house Referral: Clinical Social Work   Post Acute Care Choice:  (TBD)          DME Arranged: N/A                    Social Determinants of Health (SDOH) Interventions     Readmission Risk Interventions No flowsheet data found.

## 2021-06-01 DIAGNOSIS — I10 Essential (primary) hypertension: Secondary | ICD-10-CM | POA: Diagnosis not present

## 2021-06-01 DIAGNOSIS — Z96642 Presence of left artificial hip joint: Secondary | ICD-10-CM | POA: Diagnosis not present

## 2021-06-01 DIAGNOSIS — R531 Weakness: Secondary | ICD-10-CM | POA: Diagnosis not present

## 2021-06-01 DIAGNOSIS — E7849 Other hyperlipidemia: Secondary | ICD-10-CM | POA: Diagnosis not present

## 2021-06-01 DIAGNOSIS — E1142 Type 2 diabetes mellitus with diabetic polyneuropathy: Secondary | ICD-10-CM | POA: Diagnosis not present

## 2021-07-01 DEATH — deceased
